# Patient Record
Sex: Female | Born: 1945 | Race: Black or African American | Hispanic: No | Smoking: Never smoker
Health system: Southern US, Community
[De-identification: ages and names within clinical notes are randomized; demographics above are authoritative.]

## PROBLEM LIST (undated history)

## (undated) DIAGNOSIS — M1990 Unspecified osteoarthritis, unspecified site: Secondary | ICD-10-CM

## (undated) DIAGNOSIS — F419 Anxiety disorder, unspecified: Secondary | ICD-10-CM

## (undated) DIAGNOSIS — K219 Gastro-esophageal reflux disease without esophagitis: Secondary | ICD-10-CM

## (undated) DIAGNOSIS — J45909 Unspecified asthma, uncomplicated: Secondary | ICD-10-CM

## (undated) DIAGNOSIS — F329 Major depressive disorder, single episode, unspecified: Secondary | ICD-10-CM

## (undated) DIAGNOSIS — W19XXXS Unspecified fall, sequela: Secondary | ICD-10-CM

## (undated) DIAGNOSIS — G4733 Obstructive sleep apnea (adult) (pediatric): Secondary | ICD-10-CM

## (undated) DIAGNOSIS — F32A Depression, unspecified: Secondary | ICD-10-CM

## (undated) DIAGNOSIS — E782 Mixed hyperlipidemia: Secondary | ICD-10-CM

## (undated) DIAGNOSIS — R490 Dysphonia: Secondary | ICD-10-CM

## (undated) DIAGNOSIS — Z973 Presence of spectacles and contact lenses: Secondary | ICD-10-CM

## (undated) HISTORY — DX: Gastro-esophageal reflux disease without esophagitis: K21.9

## (undated) HISTORY — PX: BREAST BIOPSY: SHX20

## (undated) HISTORY — DX: Obstructive sleep apnea (adult) (pediatric): G47.33

## (undated) HISTORY — DX: Unspecified osteoarthritis, unspecified site: M19.90

## (undated) HISTORY — DX: Mixed hyperlipidemia: E78.2

---
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
## 1983-04-30 HISTORY — PX: CHOLECYSTECTOMY: SHX55

## 1993-04-29 DIAGNOSIS — K219 Gastro-esophageal reflux disease without esophagitis: Secondary | ICD-10-CM

## 1993-04-29 HISTORY — DX: Gastro-esophageal reflux disease without esophagitis: K21.9

## 2000-02-18 ENCOUNTER — Ambulatory Visit (HOSPITAL_BASED_OUTPATIENT_CLINIC_OR_DEPARTMENT_OTHER): Admission: RE | Admit: 2000-02-18 | Discharge: 2000-02-18 | Payer: Self-pay | Admitting: Orthopedic Surgery

## 2001-06-26 ENCOUNTER — Ambulatory Visit (HOSPITAL_COMMUNITY): Admission: RE | Admit: 2001-06-26 | Discharge: 2001-06-26 | Payer: Self-pay | Admitting: Family Medicine

## 2001-06-26 ENCOUNTER — Encounter: Payer: Self-pay | Admitting: Family Medicine

## 2001-08-12 ENCOUNTER — Ambulatory Visit: Admission: RE | Admit: 2001-08-12 | Discharge: 2001-08-12 | Payer: Self-pay | Admitting: Family Medicine

## 2001-09-11 ENCOUNTER — Emergency Department (HOSPITAL_COMMUNITY): Admission: EM | Admit: 2001-09-11 | Discharge: 2001-09-11 | Payer: Self-pay | Admitting: *Deleted

## 2001-09-11 ENCOUNTER — Encounter: Payer: Self-pay | Admitting: *Deleted

## 2001-09-13 ENCOUNTER — Emergency Department (HOSPITAL_COMMUNITY): Admission: EM | Admit: 2001-09-13 | Discharge: 2001-09-13 | Payer: Self-pay | Admitting: *Deleted

## 2001-09-13 ENCOUNTER — Encounter: Payer: Self-pay | Admitting: *Deleted

## 2001-12-23 ENCOUNTER — Encounter: Admission: RE | Admit: 2001-12-23 | Discharge: 2002-03-23 | Payer: Self-pay | Admitting: Family Medicine

## 2002-02-22 ENCOUNTER — Encounter: Payer: Self-pay | Admitting: Emergency Medicine

## 2002-02-22 ENCOUNTER — Emergency Department (HOSPITAL_COMMUNITY): Admission: EM | Admit: 2002-02-22 | Discharge: 2002-02-22 | Payer: Self-pay | Admitting: Emergency Medicine

## 2002-03-01 ENCOUNTER — Encounter: Payer: Self-pay | Admitting: Cardiology

## 2002-03-01 ENCOUNTER — Encounter (HOSPITAL_COMMUNITY): Admission: RE | Admit: 2002-03-01 | Discharge: 2002-03-31 | Payer: Self-pay | Admitting: Cardiology

## 2003-04-30 HISTORY — PX: ROTATOR CUFF REPAIR: SHX139

## 2003-08-12 ENCOUNTER — Ambulatory Visit (HOSPITAL_COMMUNITY): Admission: RE | Admit: 2003-08-12 | Discharge: 2003-08-12 | Payer: Self-pay | Admitting: Family Medicine

## 2003-08-26 ENCOUNTER — Observation Stay (HOSPITAL_COMMUNITY): Admission: RE | Admit: 2003-08-26 | Discharge: 2003-08-27 | Payer: Self-pay | Admitting: General Surgery

## 2004-04-16 ENCOUNTER — Ambulatory Visit: Payer: Self-pay | Admitting: Family Medicine

## 2004-05-23 ENCOUNTER — Ambulatory Visit: Payer: Self-pay | Admitting: Family Medicine

## 2004-06-11 ENCOUNTER — Ambulatory Visit: Payer: Self-pay | Admitting: Orthopedic Surgery

## 2004-06-28 ENCOUNTER — Emergency Department (HOSPITAL_COMMUNITY): Admission: EM | Admit: 2004-06-28 | Discharge: 2004-06-28 | Payer: Self-pay | Admitting: Emergency Medicine

## 2004-07-02 ENCOUNTER — Ambulatory Visit: Payer: Self-pay | Admitting: Orthopedic Surgery

## 2004-08-14 ENCOUNTER — Ambulatory Visit: Payer: Self-pay | Admitting: Family Medicine

## 2004-08-22 ENCOUNTER — Ambulatory Visit (HOSPITAL_COMMUNITY): Admission: RE | Admit: 2004-08-22 | Discharge: 2004-08-22 | Payer: Self-pay | Admitting: Family Medicine

## 2004-12-27 ENCOUNTER — Ambulatory Visit: Payer: Self-pay | Admitting: Family Medicine

## 2005-01-08 ENCOUNTER — Ambulatory Visit: Payer: Self-pay | Admitting: *Deleted

## 2005-01-24 ENCOUNTER — Ambulatory Visit: Payer: Self-pay | Admitting: Cardiology

## 2005-01-24 ENCOUNTER — Ambulatory Visit: Payer: Self-pay | Admitting: *Deleted

## 2005-01-24 ENCOUNTER — Encounter (HOSPITAL_COMMUNITY): Admission: RE | Admit: 2005-01-24 | Discharge: 2005-01-26 | Payer: Self-pay | Admitting: *Deleted

## 2005-01-31 ENCOUNTER — Ambulatory Visit: Payer: Self-pay | Admitting: *Deleted

## 2005-02-26 ENCOUNTER — Ambulatory Visit: Payer: Self-pay | Admitting: Family Medicine

## 2005-06-26 ENCOUNTER — Ambulatory Visit: Payer: Self-pay | Admitting: Family Medicine

## 2005-08-26 ENCOUNTER — Ambulatory Visit: Payer: Self-pay | Admitting: Family Medicine

## 2005-09-02 ENCOUNTER — Encounter (HOSPITAL_COMMUNITY): Admission: RE | Admit: 2005-09-02 | Discharge: 2005-10-02 | Payer: Self-pay | Admitting: Family Medicine

## 2005-12-20 ENCOUNTER — Ambulatory Visit: Payer: Self-pay | Admitting: Family Medicine

## 2006-01-03 ENCOUNTER — Ambulatory Visit: Payer: Self-pay | Admitting: Family Medicine

## 2006-02-26 ENCOUNTER — Ambulatory Visit: Payer: Self-pay | Admitting: Family Medicine

## 2006-02-26 ENCOUNTER — Encounter (INDEPENDENT_AMBULATORY_CARE_PROVIDER_SITE_OTHER): Payer: Self-pay | Admitting: *Deleted

## 2006-02-26 ENCOUNTER — Other Ambulatory Visit: Admission: RE | Admit: 2006-02-26 | Discharge: 2006-02-26 | Payer: Self-pay | Admitting: Family Medicine

## 2006-02-26 LAB — CONVERTED CEMR LAB: Pap Smear: NORMAL

## 2006-06-05 ENCOUNTER — Ambulatory Visit (HOSPITAL_COMMUNITY): Admission: RE | Admit: 2006-06-05 | Discharge: 2006-06-05 | Payer: Self-pay | Admitting: Family Medicine

## 2006-07-11 ENCOUNTER — Ambulatory Visit: Payer: Self-pay | Admitting: Family Medicine

## 2006-08-22 ENCOUNTER — Ambulatory Visit: Payer: Self-pay | Admitting: Family Medicine

## 2006-08-22 ENCOUNTER — Ambulatory Visit (HOSPITAL_COMMUNITY): Admission: RE | Admit: 2006-08-22 | Discharge: 2006-08-22 | Payer: Self-pay | Admitting: Family Medicine

## 2006-08-22 LAB — CONVERTED CEMR LAB
Calcium: 9.2 mg/dL (ref 8.4–10.5)
Hgb A1c MFr Bld: 5.5 % (ref 4.6–6.1)
Potassium: 3.9 meq/L (ref 3.5–5.3)
Sodium: 141 meq/L (ref 135–145)

## 2006-12-22 ENCOUNTER — Ambulatory Visit: Payer: Self-pay | Admitting: Family Medicine

## 2006-12-22 ENCOUNTER — Encounter (INDEPENDENT_AMBULATORY_CARE_PROVIDER_SITE_OTHER): Payer: Self-pay | Admitting: *Deleted

## 2006-12-22 LAB — CONVERTED CEMR LAB
ALT: 23 units/L (ref 0–35)
Blood Glucose, Fasting: 88 mg/dL
CO2: 21 meq/L (ref 19–32)
Calcium: 9.6 mg/dL (ref 8.4–10.5)
Chloride: 105 meq/L (ref 96–112)
Cholesterol: 220 mg/dL — ABNORMAL HIGH (ref 0–200)
Eosinophils Relative: 7 % — ABNORMAL HIGH (ref 0–5)
Glucose, Bld: 88 mg/dL (ref 70–99)
HCT: 41.5 % (ref 36.0–46.0)
Hemoglobin: 13.9 g/dL (ref 12.0–15.0)
Lymphocytes Relative: 27 % (ref 12–46)
Lymphs Abs: 1.2 10*3/uL (ref 0.7–3.3)
Monocytes Absolute: 0.4 10*3/uL (ref 0.2–0.7)
Neutro Abs: 2.6 10*3/uL (ref 1.7–7.7)
Platelets: 276 10*3/uL (ref 150–400)
RBC count: 5.33 10*6/uL
Sodium: 142 meq/L (ref 135–145)
TSH: 2.947 microintl units/mL
Total Bilirubin: 1.2 mg/dL (ref 0.3–1.2)
Total Protein: 7.9 g/dL (ref 6.0–8.3)
Triglycerides: 86 mg/dL (ref <150)
VLDL: 17 mg/dL (ref 0–40)
WBC, blood: 4.6 10*3/uL
WBC: 4.6 10*3/uL (ref 4.0–10.5)

## 2007-02-22 ENCOUNTER — Emergency Department (HOSPITAL_COMMUNITY): Admission: EM | Admit: 2007-02-22 | Discharge: 2007-02-22 | Payer: Self-pay | Admitting: Emergency Medicine

## 2007-03-24 ENCOUNTER — Ambulatory Visit: Payer: Self-pay | Admitting: Family Medicine

## 2007-04-17 ENCOUNTER — Ambulatory Visit (HOSPITAL_COMMUNITY): Admission: RE | Admit: 2007-04-17 | Discharge: 2007-04-17 | Payer: Self-pay | Admitting: *Deleted

## 2007-04-30 ENCOUNTER — Encounter: Payer: Self-pay | Admitting: Family Medicine

## 2007-05-25 ENCOUNTER — Encounter: Payer: Self-pay | Admitting: *Deleted

## 2007-05-25 DIAGNOSIS — M159 Polyosteoarthritis, unspecified: Secondary | ICD-10-CM

## 2007-05-25 DIAGNOSIS — E669 Obesity, unspecified: Secondary | ICD-10-CM

## 2007-05-25 DIAGNOSIS — E785 Hyperlipidemia, unspecified: Secondary | ICD-10-CM

## 2007-08-04 ENCOUNTER — Ambulatory Visit: Payer: Self-pay | Admitting: Family Medicine

## 2007-08-05 DIAGNOSIS — M549 Dorsalgia, unspecified: Secondary | ICD-10-CM | POA: Insufficient documentation

## 2007-08-06 ENCOUNTER — Encounter: Payer: Self-pay | Admitting: Family Medicine

## 2007-08-06 ENCOUNTER — Ambulatory Visit (HOSPITAL_COMMUNITY): Admission: RE | Admit: 2007-08-06 | Discharge: 2007-08-06 | Payer: Self-pay | Admitting: Family Medicine

## 2007-08-06 LAB — CONVERTED CEMR LAB
CO2: 24 meq/L (ref 19–32)
Calcium: 9.5 mg/dL (ref 8.4–10.5)
Cholesterol: 217 mg/dL — ABNORMAL HIGH (ref 0–200)
HDL: 62 mg/dL (ref >39)
Sodium: 143 meq/L (ref 135–145)
Total CHOL/HDL Ratio: 3.5
Triglycerides: 60 mg/dL (ref <150)

## 2007-08-13 ENCOUNTER — Ambulatory Visit: Payer: Self-pay | Admitting: Family Medicine

## 2007-08-13 LAB — CONVERTED CEMR LAB
Basophils Relative: 1 % (ref 0–1)
Eosinophils Absolute: 0.4 10*3/uL (ref 0.0–0.7)
MCHC: 34.2 g/dL (ref 30.0–36.0)
MCV: 75 fL — ABNORMAL LOW (ref 78.0–100.0)
Neutrophils Relative %: 49 % (ref 43–77)
Platelets: 282 10*3/uL (ref 150–400)

## 2007-08-14 ENCOUNTER — Ambulatory Visit: Payer: Self-pay | Admitting: Family Medicine

## 2007-08-17 ENCOUNTER — Ambulatory Visit: Payer: Self-pay | Admitting: Family Medicine

## 2007-09-17 ENCOUNTER — Encounter: Payer: Self-pay | Admitting: Family Medicine

## 2007-09-17 ENCOUNTER — Ambulatory Visit: Payer: Self-pay | Admitting: Family Medicine

## 2007-09-17 ENCOUNTER — Other Ambulatory Visit: Admission: RE | Admit: 2007-09-17 | Discharge: 2007-09-17 | Payer: Self-pay | Admitting: Family Medicine

## 2007-09-17 LAB — CONVERTED CEMR LAB
ALT: 68 units/L — ABNORMAL HIGH (ref 0–35)
Albumin: 4.2 g/dL (ref 3.5–5.2)
Bilirubin, Direct: 0.1 mg/dL (ref 0.0–0.3)
Total Bilirubin: 0.9 mg/dL (ref 0.3–1.2)

## 2008-01-01 ENCOUNTER — Ambulatory Visit: Payer: Self-pay | Admitting: Family Medicine

## 2008-01-01 LAB — CONVERTED CEMR LAB
CO2: 25 meq/L (ref 19–32)
Cholesterol: 204 mg/dL — ABNORMAL HIGH (ref 0–200)
Glucose, Bld: 87 mg/dL (ref 70–99)
HDL: 59 mg/dL (ref >39)
Potassium: 4.3 meq/L (ref 3.5–5.3)
Sodium: 141 meq/L (ref 135–145)
Total CHOL/HDL Ratio: 3.5
VLDL: 22 mg/dL (ref 0–40)

## 2008-01-07 ENCOUNTER — Telehealth: Payer: Self-pay | Admitting: Family Medicine

## 2008-02-03 ENCOUNTER — Telehealth: Payer: Self-pay | Admitting: Family Medicine

## 2008-02-03 ENCOUNTER — Ambulatory Visit (HOSPITAL_COMMUNITY): Admission: RE | Admit: 2008-02-03 | Discharge: 2008-02-03 | Payer: Self-pay | Admitting: Family Medicine

## 2008-02-03 ENCOUNTER — Encounter: Payer: Self-pay | Admitting: Family Medicine

## 2008-02-03 ENCOUNTER — Ambulatory Visit: Payer: Self-pay | Admitting: Family Medicine

## 2008-02-03 DIAGNOSIS — R112 Nausea with vomiting, unspecified: Secondary | ICD-10-CM

## 2008-02-03 DIAGNOSIS — J209 Acute bronchitis, unspecified: Secondary | ICD-10-CM

## 2008-02-03 DIAGNOSIS — R110 Nausea: Secondary | ICD-10-CM

## 2008-04-29 DIAGNOSIS — J45909 Unspecified asthma, uncomplicated: Secondary | ICD-10-CM

## 2008-04-29 HISTORY — DX: Unspecified asthma, uncomplicated: J45.909

## 2008-08-03 ENCOUNTER — Ambulatory Visit (HOSPITAL_COMMUNITY): Admission: RE | Admit: 2008-08-03 | Discharge: 2008-08-03 | Payer: Self-pay | Admitting: Family Medicine

## 2009-06-28 ENCOUNTER — Emergency Department (HOSPITAL_COMMUNITY): Admission: EM | Admit: 2009-06-28 | Discharge: 2009-06-28 | Payer: Self-pay | Admitting: Emergency Medicine

## 2010-02-17 ENCOUNTER — Emergency Department (HOSPITAL_COMMUNITY): Admission: EM | Admit: 2010-02-17 | Discharge: 2010-02-18 | Payer: Self-pay | Admitting: Emergency Medicine

## 2010-05-20 ENCOUNTER — Encounter: Payer: Self-pay | Admitting: Family Medicine

## 2010-05-29 NOTE — Letter (Signed)
Summary: DEMO  DEMO   Imported By: Lind Guest 11/08/2009 16:20:55  _____________________________________________________________________  External Attachment:    Type:   Image     Comment:   External Document

## 2010-05-29 NOTE — Letter (Signed)
Summary: LABS  LABS   Imported By: Lind Guest 11/08/2009 16:22:01  _____________________________________________________________________  External Attachment:    Type:   Image     Comment:   External Document

## 2010-05-29 NOTE — Letter (Signed)
Summary: OFFICE NOTES  OFFICE NOTES   Imported By: Lind Guest 11/08/2009 16:23:14  _____________________________________________________________________  External Attachment:    Type:   Image     Comment:   External Document

## 2010-05-29 NOTE — Letter (Signed)
Summary: MISC  MISC   Imported By: Lind Guest 11/08/2009 16:22:32  _____________________________________________________________________  External Attachment:    Type:   Image     Comment:   External Document

## 2010-05-29 NOTE — Letter (Signed)
Summary: PHONE NOTES  PHONE NOTES   Imported By: Lind Guest 11/08/2009 16:25:22  _____________________________________________________________________  External Attachment:    Type:   Image     Comment:   External Document

## 2010-05-29 NOTE — Letter (Signed)
Summary: HISTORY AND PHYSICAL  HISTORY AND PHYSICAL   Imported By: Lind Guest 11/08/2009 16:21:34  _____________________________________________________________________  External Attachment:    Type:   Image     Comment:   External Document

## 2010-05-29 NOTE — Letter (Signed)
Summary: CONSULTS  CONSULTS   Imported By: Lind Guest 11/08/2009 16:20:30  _____________________________________________________________________  External Attachment:    Type:   Image     Comment:   External Document

## 2010-05-29 NOTE — Letter (Signed)
Summary: X RAYS  X RAYS   Imported By: Lind Guest 11/08/2009 16:25:53  _____________________________________________________________________  External Attachment:    Type:   Image     Comment:   External Document

## 2010-08-10 ENCOUNTER — Emergency Department (HOSPITAL_COMMUNITY)
Admission: EM | Admit: 2010-08-10 | Discharge: 2010-08-11 | Disposition: A | Discharge status: Home / Self Care | Payer: BC Managed Care – PPO | Attending: Emergency Medicine | Admitting: Emergency Medicine

## 2010-08-10 ENCOUNTER — Inpatient Hospital Stay (INDEPENDENT_AMBULATORY_CARE_PROVIDER_SITE_OTHER)
Admission: RE | Admit: 2010-08-10 | Discharge: 2010-08-10 | Disposition: A | Discharge status: Home / Self Care | Payer: BLUE CROSS/BLUE SHIELD | Source: Ambulatory Visit | Attending: Emergency Medicine | Admitting: Emergency Medicine

## 2010-08-10 DIAGNOSIS — S1093XA Contusion of unspecified part of neck, initial encounter: Secondary | ICD-10-CM | POA: Insufficient documentation

## 2010-08-10 DIAGNOSIS — S0003XA Contusion of scalp, initial encounter: Secondary | ICD-10-CM | POA: Insufficient documentation

## 2010-08-10 DIAGNOSIS — IMO0002 Reserved for concepts with insufficient information to code with codable children: Secondary | ICD-10-CM | POA: Insufficient documentation

## 2010-08-10 DIAGNOSIS — R110 Nausea: Secondary | ICD-10-CM | POA: Insufficient documentation

## 2010-08-10 DIAGNOSIS — S060X9A Concussion with loss of consciousness of unspecified duration, initial encounter: Secondary | ICD-10-CM | POA: Insufficient documentation

## 2010-08-10 DIAGNOSIS — M129 Arthropathy, unspecified: Secondary | ICD-10-CM | POA: Insufficient documentation

## 2010-08-10 DIAGNOSIS — W010XXA Fall on same level from slipping, tripping and stumbling without subsequent striking against object, initial encounter: Secondary | ICD-10-CM | POA: Insufficient documentation

## 2010-08-10 DIAGNOSIS — R404 Transient alteration of awareness: Secondary | ICD-10-CM | POA: Insufficient documentation

## 2010-08-10 DIAGNOSIS — J45909 Unspecified asthma, uncomplicated: Secondary | ICD-10-CM | POA: Insufficient documentation

## 2010-08-10 DIAGNOSIS — R51 Headache: Secondary | ICD-10-CM | POA: Insufficient documentation

## 2010-08-10 DIAGNOSIS — Y929 Unspecified place or not applicable: Secondary | ICD-10-CM | POA: Insufficient documentation

## 2010-08-11 ENCOUNTER — Emergency Department (HOSPITAL_COMMUNITY): Payer: BC Managed Care – PPO

## 2010-09-14 NOTE — Op Note (Signed)
NAME:  Angela Chase, Angela Chase                      ACCOUNT NO.:  [id number]   MEDICAL RECORD NO.:  [id number]                   PATIENT TYPE:  OBV   LOCATION:  A301                                 FACILITY:  [facility]   PHYSICIAN:  Dirk Dress. Katrinka Blazing, M.D.                DATE OF BIRTH:  1945/07/11   DATE OF PROCEDURE:  DATE OF DISCHARGE:                                 OPERATIVE REPORT   PREOPERATIVE DIAGNOSIS:  Cholelithiasis, cholecystitis.   POSTOPERATIVE DIAGNOSIS:  Cholelithiasis, cholecystitis.   PROCEDURE:  Laparoscopic cholecystectomy.   SURGEON:  Dirk Dress. Katrinka Blazing, M.D.   DESCRIPTION OF PROCEDURE:  Under general endotracheal anesthesia, the  patient's abdomen was prepped and draped in a sterile field.   A supraumbilical midline incision was made.  A Veress needle was inserted  uneventfully.  The abdomen was insufflated with 3 L of CO2.  Using a  Visiport guide, a 10-mm port was placed uneventfully.  The laparoscope was  placed.  Laparoscopic evaluation of the liver revealed it to be slightly  sclerotic with yellowish changes suggestive of fatty infiltration.  There  was some sclerosis in the liver bed.  Under videoscopic guidance, a 10-mm  port and two 5-mm ports were placed in the right upper quadrant.  The  gallbladder was manipulated by the assistant.  The cystic duct was  dissected, clipped with five clips, and divided.  The cystic artery was  dissected, clipped with three clips, and divided.  Using electrocautery, the  gallbladder was separated from the infrahepatic space without difficulty.  It was placed in an EndoCatch device and retrieved.  Hemostasis in the bed  was achieved.  There was no evidence of bile leak.  Four photos of the liver  and the gallbladder bed were taken.  The irrigating fluid was clear.  CO2  was allowed to escape from the abdomen, and the ports were removed.  The  incisions were closed using 0 Dexon on the fascia of the lower two incisions  and staples on  the skin.  Dressings were placed.   The patient tolerated the procedure well.  She was awakened from anesthesia  without difficulty, transferred to a bed, and taken to the postanesthetic  care unit for further monitoring.      ___________________________________________                                            Dirk Dress. Katrinka Blazing, M.D.   LCS/MEDQ  D:  08/26/2003  T:  08/26/2003  Job:  [id number]

## 2010-09-14 NOTE — Op Note (Signed)
Angela Chase. [facility]  Patient:    Angela Chase, Angela Chase                     MRN: [id number] Proc. Date: 02/18/00 Adm. Date:  [id number] Disc. Date: [id number] Attending:  Twana First                           Operative Report  PREOPERATIVE DIAGNOSIS:  Right shoulder partial rotator cuff tear.  POSTOPERATIVE DIAGNOSIS:  Right shoulder rotator cuff tendonitis and impingement.  OPERATION:  Right shoulder examination under anesthesia followed by arthroscopic subacromial decompression.  SURGEON:  Elana Alm. Thurston Hole, M.D.  ASSISTANT:  Kirstin A. Shepperson, P.A.  ANESTHESIA:  General  OPERATIVE TIME:  30 minutes  COMPLICATIONS:  None.  INDICATION FOR PROCEDURE:  Mrs. Sidell is a 65 year old woman who had injured her right shoulder over a year ago in a fall.  Significant pain.  MRI documenting rotating cuff tendonitis with possible partial tear.  Failed conservative care and is now to undergo arthroscopy.  DESCRIPTION OF PROCEDURE:  Mrs. Lal is brought to the operating room on February 18, 2000 after supraclavicular block had been placed in the holding room.  Placed on the operative table in the supine position. After an adequate level of general anesthesia was obtained, her right shoulder was examined under anesthesia.  She has full range of motion and her shoulder was stable to ligamentous exam.  After this was done, she was placed in a beach chair position and her shoulder and arm were prepped using sterile Betadine and draped using sterile technique.  Originally through a posterior arthroscopic portal, the arthroscope with a pump attachment was placed and through an anterior portal an arthroscopic probe was placed.  On initial inspection, the articular cartilage and the glenohumeral joint was found to be normal. Anterior and posterior labrum normal.  Anterior inferior glenohumeral ligament complex normal.  Superior labrum and biceps tendon  anchor was normal. Biceps tendon was normal.  Rotator cuff was thoroughly inspected but there was no found to be no evidence of a tear on the articular surface. The subacromial space was entered and a lateral arthroscopic portal was made.  Moderate bursitis was resected.  Underneath this, the rotator cuff was found to be moderately frayed and edematous but no evidence of a tear.  Subacromial decompression was carried out, removing 6 to 8 mm of the undersurface of the anterior, anterolateral and anterior medial acromion and a CA ligament release carried out with cautery. The [facility] joint was not disturbed.  After this was done, the shoulder could be brought through a full range of motion with no impingement on the rotator cuff.  At this point, it was felt that all pathology had been satisfactorily addressed.  Instruments were removed. Portals were closed with 3-0 nylon suture and injected with 0.25% Marcaine with epinephrine.  Sterile dressings and a sling applied.  The patient awakened and taken to the recovery room in stable condition.  FOLLOW UP CARE: Mrs. Kreiser will be followed as an outpatient on Vicodin and Naprosyn.  Begin early physical therapy for range of motion, followed by strengthening.  See her back in the office in a week for sutures out and follow up. DD:  02/18/00 TD:  02/18/00 Job: [id number]

## 2010-09-14 NOTE — H&P (Signed)
NAME:  Angela Chase, Angela Chase                      ACCOUNT NO.:  [id number]   MEDICAL RECORD NO.:  [id number]                   PATIENT TYPE:  AMB   LOCATION:  DAY                                  FACILITY:  [facility]   PHYSICIAN:  Jerolyn Shin C. Katrinka Blazing, M.D.                DATE OF BIRTH:  March 14, 1946   DATE OF ADMISSION:  DATE OF DISCHARGE:                                HISTORY & PHYSICAL   HISTORY OF PRESENT ILLNESS:  [age]-year-old female with history of  intermittent epigastric and right upper quadrant discomfort.  She has had  nausea.  Evaluation revealed a contracted gallbladder with multiple small  stones.  The patient has been scheduled for laparoscopic cholecystectomy.   PAST HISTORY:  The past history is positive for:  1. Osteoarthritis.  2. Hyperactive bladder syndrome.  3. Menopausal syndrome.   MEDICATIONS:  1. Celebrex 200 mg daily.  2. Lasix 40 mg daily.  3. Potassium chloride 20 mEq daily,  4. Detrol LA 4 mg daily.  5. Premarin 0.45 mg daily.   PHYSICAL EXAMINATION:  VITAL SIGNS:  On exam blood pressure is 120/80, pulse  76, respirations 20 and weight 254 pounds.  HEENT:  Unremarkable.  NECK:  Neck is supple.  CHEST:  Chest is clear.  HEART:  Regular rate and rhythm without murmur, gallop or rub.  ABDOMEN:  Moderate epigastric and right upper quadrant tenderness.  No mass.  Mild guarding.  Normoactive bowel sounds.  EXTREMITIES:  Two plus edema bilateral otherwise unremarkable.  NEUROLOGIC:  Neurologic exam is nonfocal.   IMPRESSION:  1. Cholelithiasis.  2. Cholecystitis.  3. Osteoarthritis.  4. Menopausal syndrome.  5. Hyperactive bladder syndrome.   PLAN:  Laparoscopic cholecystectomy.     ___________________________________________                                         Dirk Dress Katrinka Blazing, M.D.   LCS/MEDQ  D:  08/26/2003  T:  08/26/2003  Job:  [id number]

## 2010-09-14 NOTE — Procedures (Signed)
NAMEJARIYAH, Angela Chase NO.:  [id number]   MEDICAL RECORD NO.:  [id number]          PATIENT TYPE:  REC   LOCATION:                                FACILITY:  [facility]   PHYSICIAN:  [place] Bing, M.D. [facility] OF BIRTH:  [birth year]   DATE OF PROCEDURE:  01/24/2005  DATE OF DISCHARGE:                                  ECHOCARDIOGRAM   REFERRING:  Dr. Lodema Hong and Dr. Dorethea Clan.   CLINICAL DATA:  A 65 year old woman with chest pain.   M-MODE:  Aorta 2.4, left atrium 3.3, septum 1.2, posterior wall 1.2, LV  diastole 3.4, LV systole 2.4.   1.  Technically adequate echocardiographic study.  2.  Normal left atrium, right atrium and right ventricle.  3.  Normal aortic, mitral, tricuspid and pulmonic valves; normal proximal      pulmonary artery.  4.  Normal Doppler examination with trivial mitral regurgitation and      physiologic tricuspid regurgitation.  5.  Normal left ventricular size; borderline hypertrophy; normal regional      and global function.  6.  Normal IVC.      [place] Bing, M.D. [facility]  Electronically Signed     RR/MEDQ  D:  01/24/2005  T:  01/24/2005  Job:  [id number]

## 2010-11-25 ENCOUNTER — Emergency Department (HOSPITAL_COMMUNITY): Payer: BC Managed Care – PPO

## 2010-11-25 ENCOUNTER — Emergency Department (HOSPITAL_COMMUNITY)
Admission: EM | Admit: 2010-11-25 | Discharge: 2010-11-25 | Disposition: A | Discharge status: Home / Self Care | Payer: BC Managed Care – PPO | Attending: Emergency Medicine | Admitting: Emergency Medicine

## 2010-11-25 DIAGNOSIS — R112 Nausea with vomiting, unspecified: Secondary | ICD-10-CM | POA: Insufficient documentation

## 2010-11-25 DIAGNOSIS — K5289 Other specified noninfective gastroenteritis and colitis: Secondary | ICD-10-CM | POA: Insufficient documentation

## 2010-11-25 DIAGNOSIS — K209 Esophagitis, unspecified without bleeding: Secondary | ICD-10-CM | POA: Insufficient documentation

## 2010-11-25 DIAGNOSIS — R6883 Chills (without fever): Secondary | ICD-10-CM | POA: Insufficient documentation

## 2010-11-25 DIAGNOSIS — R1013 Epigastric pain: Secondary | ICD-10-CM | POA: Insufficient documentation

## 2010-11-25 DIAGNOSIS — J45909 Unspecified asthma, uncomplicated: Secondary | ICD-10-CM | POA: Insufficient documentation

## 2010-11-25 DIAGNOSIS — E669 Obesity, unspecified: Secondary | ICD-10-CM | POA: Insufficient documentation

## 2010-11-25 LAB — COMPREHENSIVE METABOLIC PANEL
ALT: 24 U/L (ref 0–35)
Albumin: 3.5 g/dL (ref 3.5–5.2)
Calcium: 9.5 mg/dL (ref 8.4–10.5)
GFR calc Af Amer: >60 mL/min (ref >60)
Glucose, Bld: 94 mg/dL (ref 70–99)
Potassium: 3.7 mEq/L (ref 3.5–5.1)
Sodium: 140 mEq/L (ref 135–145)
Total Protein: 7.9 g/dL (ref 6.0–8.3)

## 2010-11-25 LAB — CK TOTAL AND CKMB (NOT AT ARMC)
CK, MB: 1.6 ng/mL (ref 0.3–4.0)
Relative Index: INVALID (ref 0.0–2.5)
Relative Index: INVALID (ref 0.0–2.5)
Total CK: 63 U/L (ref 7–177)
Total CK: 64 U/L (ref 7–177)

## 2010-11-25 LAB — CBC
HCT: 40.8 % (ref 36.0–46.0)
Hemoglobin: 14.3 g/dL (ref 12.0–15.0)
MCHC: 35 g/dL (ref 30.0–36.0)
RBC: 5.52 MIL/uL — ABNORMAL HIGH (ref 3.87–5.11)
WBC: 5.7 10*3/uL (ref 4.0–10.5)

## 2010-11-25 LAB — DIFFERENTIAL
Basophils Absolute: 0 10*3/uL (ref 0.0–0.1)
Eosinophils Relative: 2 % (ref 0–5)
Lymphocytes Relative: 14 % (ref 12–46)
Lymphs Abs: 0.8 10*3/uL (ref 0.7–4.0)
Monocytes Relative: 9 % (ref 3–12)
Neutrophils Relative %: 75 % (ref 43–77)

## 2010-11-25 LAB — TROPONIN I: Troponin I: <0.3 ng/mL (ref <0.30)

## 2010-11-25 MED ORDER — IOHEXOL 300 MG/ML  SOLN: 100.0000 mL | Freq: Once | INTRAMUSCULAR | Status: DC | PRN

## 2011-04-28 ENCOUNTER — Emergency Department (INDEPENDENT_AMBULATORY_CARE_PROVIDER_SITE_OTHER): Payer: BC Managed Care – PPO

## 2011-04-28 ENCOUNTER — Emergency Department (INDEPENDENT_AMBULATORY_CARE_PROVIDER_SITE_OTHER)
Admission: EM | Admit: 2011-04-28 | Discharge: 2011-04-28 | Disposition: A | Discharge status: Home / Self Care | Payer: BC Managed Care – PPO | Source: Home / Self Care

## 2011-04-28 DIAGNOSIS — J988 Other specified respiratory disorders: Secondary | ICD-10-CM

## 2011-04-28 DIAGNOSIS — J209 Acute bronchitis, unspecified: Secondary | ICD-10-CM

## 2011-04-28 DIAGNOSIS — J45909 Unspecified asthma, uncomplicated: Secondary | ICD-10-CM

## 2011-04-28 DIAGNOSIS — B9789 Other viral agents as the cause of diseases classified elsewhere: Secondary | ICD-10-CM

## 2011-04-28 MED ORDER — ALBUTEROL SULFATE (5 MG/ML) 0.5% IN NEBU
INHALATION_SOLUTION | RESPIRATORY_TRACT | Status: AC
  Filled 2011-04-28: qty 1

## 2011-04-28 MED ORDER — ALBUTEROL SULFATE (5 MG/ML) 0.5% IN NEBU
5.0000 mg | INHALATION_SOLUTION | Freq: Once | RESPIRATORY_TRACT | Status: AC
  Administered 2011-04-28: 5 mg via RESPIRATORY_TRACT

## 2011-04-28 MED ORDER — ALBUTEROL SULFATE HFA 108 (90 BASE) MCG/ACT IN AERS: 2.0000 | INHALATION_SPRAY | RESPIRATORY_TRACT | Status: DC | PRN

## 2011-04-28 MED ORDER — IPRATROPIUM BROMIDE 0.02 % IN SOLN
0.5000 mg | Freq: Once | RESPIRATORY_TRACT | Status: AC
  Administered 2011-04-28: 0.5 mg via RESPIRATORY_TRACT

## 2011-04-28 MED ORDER — PREDNISONE 20 MG PO TABS: 20.0000 mg | ORAL_TABLET | Freq: Two times a day (BID) | ORAL | Status: AC

## 2011-04-28 NOTE — ED Provider Notes (Signed)
History     CSN: [id number]  Arrival date & time 04/28/11  0904   None     Chief Complaint  Patient presents with  . Nasal Congestion    cough,congestion, fever, headache,     (Consider location/radiation/quality/duration/timing/severity/associated sxs/prior treatment) HPI Comments: Onset 6 days ago of chills, cough, HA, body aches and sore throat. Pt states she has felt feverish but has not checked her temp. She has been taking Mucinex and over the counter fever reducing medication. She feels that her symptoms have worsened in the last 3-4 days. Her cough is productive with yellow mucus. Mild nasal congestion, without sinus pressure. She states she has shortness of breath only while coughing, but has had intermittent wheezing.   The history is provided by the patient.    Past Medical History  Diagnosis Date  . Asthma   . Osteoporosis     Past Surgical History  Procedure Date  . Rotator cuff repair     No family history on file.  History  Substance Use Topics  . Smoking status: Never Smoker   . Smokeless tobacco: Not on file  . Alcohol Use: No    OB History    Grav Para Term Preterm Abortions TAB SAB Ect Mult Living                  Review of Systems  Constitutional: Positive for fever, chills and fatigue.  HENT: Positive for congestion, sore throat and rhinorrhea. Negative for ear pain, sneezing, postnasal drip and sinus pressure.   Respiratory: Positive for cough, shortness of breath and wheezing.   Cardiovascular: Negative for chest pain and palpitations.    Allergies  Cephalexin and Rofecoxib  Home Medications   Current Outpatient Rx  Name Route Sig Dispense Refill  . FUROSEMIDE 20 MG PO TABS Oral Take 20 mg by mouth daily as needed.      Marland Kitchen HYDROCODONE-ACETAMINOPHEN 5-325 MG PO TABS Oral Take 1 tablet by mouth every 6 (six) hours as needed.      . ALBUTEROL SULFATE HFA 108 (90 BASE) MCG/ACT IN AERS Inhalation Inhale 2 puffs into the lungs every 4  (four) hours as needed for wheezing or shortness of breath. 1 Inhaler 0  . PREDNISONE 20 MG PO TABS Oral Take 1 tablet (20 mg total) by mouth 2 (two) times daily. 10 tablet 0    BP 140/89  Pulse 106  Temp(Src) 99.5 F (37.5 C) (Oral)  Resp 25  SpO2 99%  Physical Exam  Nursing note and vitals reviewed. Constitutional: She appears well-developed and well-nourished. No distress.  HENT:  Head: Normocephalic and atraumatic.  Right Ear: Tympanic membrane, external ear and ear canal normal.  Left Ear: Tympanic membrane, external ear and ear canal normal.  Nose: Nose normal.  Mouth/Throat: Uvula is midline, oropharynx is clear and moist and mucous membranes are normal. No oropharyngeal exudate, posterior oropharyngeal edema or posterior oropharyngeal erythema.  Neck: Neck supple.  Cardiovascular: Normal rate, regular rhythm and normal heart sounds.   Pulmonary/Chest: Effort normal. No respiratory distress. She has decreased breath sounds. She has no wheezes. She has no rhonchi. She has no rales.  Lymphadenopathy:    She has no cervical adenopathy.  Neurological: She is alert.  Skin: Skin is warm and dry.  Psychiatric: She has a normal mood and affect.    ED Course  Procedures (including critical care time)  Labs Reviewed - No data to display Dg Chest 2 View  04/28/2011  *RADIOLOGY REPORT*  Clinical Data: Cough, fever  CHEST - 2 VIEW  Comparison: 11/25/2010  Findings: Cardiomediastinal silhouette is stable.  No acute infiltrate or pleural effusion.  No pulmonary edema.  Stable mild degenerative changes thoracic spine.  IMPRESSION: No active disease.  Original Report Authenticated By: Natasha Mead, M.D.     1. Viral respiratory illness   2. Asthma       MDM  Symptomatic improvement and improved breath sounds after NMT. Pulse ox improved to 99% RA. Flu-like-symptoms, without documented fever. Hx of asthma.         Melody Comas, [place] 04/28/11 1031

## 2011-04-28 NOTE — ED Provider Notes (Signed)
Medical screening examination/treatment/procedure(s) were performed by non-physician practitioner and as supervising physician I was immediately available for consultation/collaboration.  Heidy Mccubbin   Eean Buss, MD 04/28/11 1302 

## 2011-04-28 NOTE — ED Notes (Signed)
Pt c/o of fever, productive cough with yellow sputum, dizziness, chills, headache and congestion started one week ago, has tried muscinex without relief.

## 2011-08-30 ENCOUNTER — Encounter (HOSPITAL_COMMUNITY): Payer: Self-pay

## 2011-08-30 ENCOUNTER — Emergency Department (HOSPITAL_COMMUNITY)
Admission: EM | Admit: 2011-08-30 | Discharge: 2011-08-30 | Disposition: A | Discharge status: Home / Self Care | Payer: BC Managed Care – PPO | Source: Home / Self Care | Attending: Family Medicine | Admitting: Family Medicine

## 2011-08-30 DIAGNOSIS — R0789 Other chest pain: Secondary | ICD-10-CM

## 2011-08-30 NOTE — ED Notes (Signed)
Dr Juanetta Gosling saw EKG- aware of pts sx and hx.  Has already spoke with pt

## 2011-08-30 NOTE — ED Notes (Signed)
CALLED TO WAITING AREA TO ASSESS A PT WITH SUDDEN RIGHT SHOULDER ACHY PAIN THAT EASED UP THROUGH THE NIGHT BUT THEN DEVELOPED MID STERNAL INTERMITT SHARP CP WITH DRIVING TODAY.PAIN NON RADIATING.RATES PAIN 3/10 AT THIS TIME.PT STATES SHE HAD SIMILAR SX 3 MNTHS AGO BUT DIDN'T SEEK MEDICAL ATTENTION.STATES LAST EKG 2 MNTHS AGO BUT NORMAL.NO DISTRESS SEEN.VSS.TOLD PT INFORM FRONT DESK IF SX WORSENS WITH SOB OR INCREASED PAIN

## 2011-08-30 NOTE — ED Provider Notes (Signed)
History     CSN: [id number]  Arrival date & time 08/30/11  1206   First MD Initiated Contact with Patient 08/30/11 1211      Chief Complaint  Patient presents with  . Chest Pain    (Consider location/radiation/quality/duration/timing/severity/associated sxs/prior treatment) HPI Comments: Angela Chase presents for evaluation of mid-sternal sharp chest pain that started yesterday at rest. She reports that the pain is sharp, not brought on by exertion, not relieved by rest, and lasts for hours before resolving on its own. She denies any associated symptoms such as cough, shortness of breath, nausea, diaphoresis, or visual changes. She denies any known cardiac hx, is not being treated for hypertension, and has no known coronary artery disease. She does report a recent hx of increased stress surrounding family issues and illness in a family member. She denies any hx of anxiety or panic attacks. She reports a normal EKG several months ago and she reports that she had a negative stress test "sometime late in 2012, maybe July."  Patient is a 66 y.o. female presenting with chest pain. The history is provided by the patient.  Chest Pain The chest pain began yesterday. Chest pain occurs intermittently. The chest pain is improving. The pain is associated with breathing. The quality of the pain is described as aching and sharp. The pain does not radiate. Pertinent negatives for primary symptoms include no shortness of breath, no palpitations, no nausea and no vomiting.  Pertinent negatives for associated symptoms include no diaphoresis, no near-syncope, no numbness and no weakness. She tried nothing for the symptoms. Risk factors include no known risk factors.  Pertinent negatives for past medical history include no CAD, no diabetes, no hypertension and no MI.  Procedure history is positive for echocardiogram and exercise treadmill test.  Procedure history is negative for cardiac catheterization.     Past  Medical History  Diagnosis Date  . Asthma   . Osteoporosis     Past Surgical History  Procedure Date  . Rotator cuff repair   . Cholecystectomy     No family history on file.  History  Substance Use Topics  . Smoking status: Never Smoker   . Smokeless tobacco: Not on file  . Alcohol Use: No    OB History    Grav Para Term Preterm Abortions TAB SAB Ect Mult Living                  Review of Systems  Constitutional: Negative.  Negative for diaphoresis.  HENT: Negative.   Eyes: Negative.   Respiratory: Negative.  Negative for shortness of breath.   Cardiovascular: Positive for chest pain. Negative for palpitations and near-syncope.  Gastrointestinal: Negative.  Negative for nausea and vomiting.  Genitourinary: Negative.   Musculoskeletal: Negative.   Skin: Negative.   Neurological: Negative.  Negative for weakness and numbness.    Allergies  Cephalexin and Rofecoxib  Home Medications   Current Outpatient Rx  Name Route Sig Dispense Refill  . ALBUTEROL SULFATE HFA 108 (90 BASE) MCG/ACT IN AERS Inhalation Inhale 2 puffs into the lungs every 4 (four) hours as needed for wheezing or shortness of breath. 1 Inhaler 0  . FUROSEMIDE 20 MG PO TABS Oral Take 20 mg by mouth daily as needed.      Marland Kitchen HYDROCODONE-ACETAMINOPHEN 5-325 MG PO TABS Oral Take 1 tablet by mouth every 6 (six) hours as needed.        BP 133/55  Pulse 96  Temp(Src) 98.1 F (36.7 C) (  Oral)  Resp 20  SpO2 99%  Physical Exam  Nursing note and vitals reviewed. Constitutional: She is oriented to person, place, and time. She appears well-developed and well-nourished.  HENT:  Head: Normocephalic and atraumatic.  Eyes: Conjunctivae and EOM are normal. Pupils are equal, round, and reactive to light.  Neck: Normal range of motion.  Cardiovascular: Normal rate, regular rhythm, S1 normal, S2 normal and normal heart sounds.   No murmur heard.      ECG: NSR, rate 78, no ST changes, no T wave abnormalities    Pulmonary/Chest: Effort normal and breath sounds normal. She has no decreased breath sounds. She has no wheezes. She has no rhonchi.  Musculoskeletal: Normal range of motion.  Neurological: She is alert and oriented to person, place, and time.  Skin: Skin is warm and dry.  Psychiatric: Her behavior is normal.    ED Course  Procedures (including critical care time)  Labs Reviewed - No data to display No results found.   1. Atypical chest pain       MDM  ECG reviewed, normal, no hx of cardiac disease, negative stress test in 2012, low TIMI score; return precautions given        Renaee Munda, MD 09/01/11 1352

## 2011-08-30 NOTE — ED Notes (Signed)
Pt c/o sharp mid sternal chest pain that started last pm.  States she continues to have intermittent pain today.  Currently rates as 3/10.  Pain is non-radiating, pt denies SOB, n/v or diaphoresis.  Reports she has been under a lot of stress and has a lot going on. No hx of heart disease.

## 2011-08-30 NOTE — Discharge Instructions (Signed)
Your EKG and examination were unremarkable today, and not suspicious for any cardiac abnormality. Using average numbers with your vital signs and age, your 10 year-risk of a cardiac event is 2% ([place] 10-yr risk); your 14-day risk factor for a cardiac event is 5% (TIMI score of 0). Given these statistics, it is unlikely that your chest discomfort is due to your heart. However, return to care should your symptoms not improve, or worsen in any way, such as persistent chest pain, shortness of breath, sweating, nausea, or other symptoms that concern you.

## 2011-09-19 ENCOUNTER — Emergency Department (HOSPITAL_COMMUNITY)
Admission: EM | Admit: 2011-09-19 | Discharge: 2011-09-19 | Disposition: A | Discharge status: Home / Self Care | Payer: BC Managed Care – PPO | Attending: Emergency Medicine | Admitting: Emergency Medicine

## 2011-09-19 ENCOUNTER — Encounter (HOSPITAL_COMMUNITY): Payer: Self-pay | Admitting: *Deleted

## 2011-09-19 DIAGNOSIS — R509 Fever, unspecified: Secondary | ICD-10-CM | POA: Insufficient documentation

## 2011-09-19 DIAGNOSIS — R51 Headache: Secondary | ICD-10-CM | POA: Insufficient documentation

## 2011-09-19 DIAGNOSIS — R059 Cough, unspecified: Secondary | ICD-10-CM | POA: Insufficient documentation

## 2011-09-19 DIAGNOSIS — R05 Cough: Secondary | ICD-10-CM | POA: Insufficient documentation

## 2011-09-19 DIAGNOSIS — J40 Bronchitis, not specified as acute or chronic: Secondary | ICD-10-CM | POA: Insufficient documentation

## 2011-09-19 LAB — POCT I-STAT, CHEM 8
Calcium, Ion: 1.15 mmol/L (ref 1.12–1.32)
Chloride: 100 mEq/L (ref 96–112)
HCT: 47 % — ABNORMAL HIGH (ref 36.0–46.0)
Sodium: 138 mEq/L (ref 135–145)
TCO2: 25 mmol/L (ref 0–100)

## 2011-09-19 MED ORDER — ONDANSETRON HCL 4 MG/2ML IJ SOLN
4.0000 mg | Freq: Once | INTRAMUSCULAR | Status: AC
  Administered 2011-09-19: 4 mg via INTRAVENOUS
  Filled 2011-09-19: qty 2

## 2011-09-19 MED ORDER — ACETAMINOPHEN 325 MG PO TABS
975.0000 mg | ORAL_TABLET | Freq: Once | ORAL | Status: AC
  Administered 2011-09-19: 975 mg via ORAL

## 2011-09-19 MED ORDER — ALBUTEROL SULFATE (5 MG/ML) 0.5% IN NEBU
5.0000 mg | INHALATION_SOLUTION | Freq: Once | RESPIRATORY_TRACT | Status: AC
  Administered 2011-09-19: 5 mg via RESPIRATORY_TRACT
  Filled 2011-09-19: qty 1

## 2011-09-19 MED ORDER — ACETAMINOPHEN 325 MG PO TABS
ORAL_TABLET | ORAL | Status: AC
  Filled 2011-09-19: qty 4

## 2011-09-19 MED ORDER — ACETAMINOPHEN 500 MG PO TABS
1000.0000 mg | ORAL_TABLET | Freq: Once | ORAL | Status: DC
  Filled 2011-09-19: qty 2

## 2011-09-19 MED ORDER — ONDANSETRON HCL 4 MG PO TABS: 8.0000 mg | ORAL_TABLET | Freq: Three times a day (TID) | ORAL | Status: AC | PRN

## 2011-09-19 MED ORDER — POTASSIUM CHLORIDE CRYS ER 20 MEQ PO TBCR
40.0000 meq | EXTENDED_RELEASE_TABLET | Freq: Once | ORAL | Status: AC
  Administered 2011-09-19: 40 meq via ORAL
  Filled 2011-09-19: qty 2

## 2011-09-19 MED ORDER — SODIUM CHLORIDE 0.9 % IV BOLUS (SEPSIS)
1000.0000 mL | Freq: Once | INTRAVENOUS | Status: AC
  Administered 2011-09-19: 1000 mL via INTRAVENOUS

## 2011-09-19 NOTE — ED Provider Notes (Addendum)
History  This chart was scribed for Doug Sou, MD by Bennett Scrape. This patient was seen in room STRE8/STRE8 and the patient's care was started at 2:08PM.  CSN: [id number]  Arrival date & time 09/19/11  1233   First MD Initiated Contact with Patient 09/19/11 1408      Chief Complaint  Patient presents with  . Headache    Patient is a 66 y.o. female presenting with headaches. The history is provided by the patient. No language interpreter was used.  Headache  This is a new problem. The current episode started 2 days ago. The problem occurs constantly. The problem has not changed since onset.The headache is associated with nothing. The pain is located in the bilateral and parietal region. The pain does not radiate. Associated symptoms include a fever. Pertinent negatives include no shortness of breath, no nausea and no vomiting. She has tried nothing for the symptoms.    YARIMA PENMAN is a 66 y.o. female with a h/o asthma and osteoporosis who presents to the Emergency Department complaining of 2 days of constant HA gradual in onset located along the top of the head with associated nausea. She is nauseated now. Pt also c/o 3 days of nasal congestion and productive cough of yellow sputum. Pt thinks that she might have had a fever but did not measure it at home. Pt was seen by PCP last week and was prescribed hydrocodone. She states that she took one dose 2 days ago but it did not help. She reports using her inhaler with improvement in her cough. She denies taking OTC medications to improve her symptoms. She denies smoking and alcohol use.   PCP is Dr Loleta Chance with the [facility].  Past Medical History  Diagnosis Date  . Asthma   . Osteoporosis     Past Surgical History  Procedure Date  . Rotator cuff repair   . Cholecystectomy     No family history on file.  History  Substance Use Topics  . Smoking status: Never Smoker   . Smokeless tobacco: Not on file  . Alcohol  Use: No     Review of Systems  Constitutional: Positive for fever, chills and appetite change.  HENT: Positive for congestion. Negative for sore throat.   Respiratory: Positive for cough. Negative for shortness of breath.   Gastrointestinal: Negative for nausea and vomiting.  Neurological: Positive for headaches. Negative for weakness.    Allergies  Cephalexin; Omeprazole; and Rofecoxib  Home Medications   Current Outpatient Rx  Name Route Sig Dispense Refill  . ALBUTEROL SULFATE HFA 108 (90 BASE) MCG/ACT IN AERS Inhalation Inhale 2 puffs into the lungs every 4 (four) hours as needed. For shortness of breath/cough    . ASPIRIN EC 81 MG PO TBEC Oral Take 81 mg by mouth daily.    . FUROSEMIDE 20 MG PO TABS Oral Take 20 mg by mouth daily as needed. For fluid    . HYDROCODONE-ACETAMINOPHEN 5-325 MG PO TABS Oral Take 1 tablet by mouth daily as needed. For pain    . NASAL SALINE NA Nasal Place 2 sprays into the nose daily as needed. Stuffy nose      Triage Vitals: BP 148/84  Pulse 109  Temp(Src) 98.3 F (36.8 C) (Oral)  Resp 18  SpO2 97%  Physical Exam  Nursing note and vitals reviewed. Constitutional: She is oriented to person, place, and time. She appears well-developed and well-nourished. No distress.  HENT:  Head: Normocephalic and atraumatic.  Eyes: EOM are normal.  Neck: Neck supple. No tracheal deviation present.  Cardiovascular: Normal rate.   Pulmonary/Chest: Effort normal. No respiratory distress. She has no wheezes. She has no rales.       Diffuse scant rhonchi  Musculoskeletal: Normal range of motion.  Neurological: She is alert and oriented to person, place, and time.  Skin: Skin is warm and dry.  Psychiatric: She has a normal mood and affect. Her behavior is normal.    ED Course  Procedures (including critical care time) 3:55 PM patient feels much improved after treatment with intravenous fluids, breathing is normal after treatment with albuterol nebulizer,  and she is no longer nauseated after treatment with Zofran DIAGNOSTIC STUDIES: Oxygen Saturation is 97% on room air, normal by my interpretation.    COORDINATION OF CARE: 2:13PM-Discussed treatment plan of tylenol, antinausea medication and a breathing treatment with pt and pt agreed to plan. 3:58PM-Pt rechecked and states that she is feeling better after the breathing treatment. Discussed discharge plan with pt and pt agreed.  Labs Reviewed - No data to display No results found.   No diagnosis found.   Results for orders placed during the hospital encounter of 09/19/11  POCT I-STAT, CHEM 8      Component Value Range   Sodium 138  135 - 145 (mEq/L)   Potassium 2.9 (*) 3.5 - 5.1 (mEq/L)   Chloride 100  96 - 112 (mEq/L)   BUN 4 (*) 6 - 23 (mg/dL)   Creatinine, Ser 1.61  0.50 - 1.10 (mg/dL)   Glucose, Bld 096 (*) 70 - 99 (mg/dL)   Calcium, Ion 0.45  4.09 - 1.32 (mmol/L)   TCO2 25  0 - 100 (mmol/L)   Hemoglobin 16.0 (*) 12.0 - 15.0 (g/dL)   HCT 81.1 (*) 91.4 - 46.0 (%)   No results found.  MDM  Suspect viral etiology of symptoms with nasal congestion cough no fever Plan prescription for Zofran, patient has pro air inhaler which she can use 2 puffs every 4 hours as needed for shortness of breath or cough Followup Dr. Loleta Chance next week to get serum potassium rechecked  KCl40 meqwas by mouth prior to discharge Diagnosis #1 bronchitis #2 hypokalemia #3 mild dehydration           Doug Sou, MD 09/19/11 1603  Doug Sou, MD 09/19/11 2115

## 2011-09-19 NOTE — ED Notes (Signed)
Headache since Tuesday that was located to top of head.  Pt has tried mucinex.  Pt sound congested and reports sputum coming out of nose and coughing up thick yellow sputum.  Pt reports chills and decreased appetite.

## 2011-09-19 NOTE — Discharge Instructions (Signed)
Bronchitis  Usual pro air inhaler 2 puffs every 4 hours as needed for shortness of breath or cough. Take Tylenol as directed for pain. Drink lots of fluids you're mildly dehydrated today. Call Dr. Loleta Chance to arrange to be seen in the office next week. Ask him to recheck your blood potassium level. Today's was low at 2.9. Did not take any Lasix(furosemide) today as you are mildly dehydrated. Return if her condition worsens for any reason. Bronchitis is a problem of the air tubes leading to your lungs. This problem makes it hard for air to get in and out of the lungs. You may cough a lot because your air tubes are narrow. Going without care can cause lasting (chronic) bronchitis. HOME CARE   Drink enough fluids to keep your pee (urine) clear or pale yellow.   Use a cool mist humidifier.   Quit smoking if you smoke. If you keep smoking, the bronchitis might not get better.   Only take medicine as told by your doctor.  GET HELP RIGHT AWAY IF:   Coughing keeps you awake.   You start to wheeze.   You become more sick or weak.   You have a hard time breathing or get short of breath.   You cough up blood.   Coughing lasts more than 2 weeks.   You have a fever.   Your baby is older than 3 months with a rectal temperature of 102 F (38.9 C) or higher.   Your baby is 9 months old or younger with a rectal temperature of 100.4 F (38 C) or higher.  MAKE SURE YOU:  Understand these instructions.   Will watch your condition.   Will get help right away if you are not doing well or get worse.  Document Released: 10/02/2007 Document Revised: 04/04/2011 Document Reviewed: 03/17/2009 [facility] Patient Information 2012 [place], [place].

## 2011-09-25 ENCOUNTER — Encounter: Payer: Self-pay | Admitting: Cardiology

## 2011-09-26 ENCOUNTER — Ambulatory Visit (INDEPENDENT_AMBULATORY_CARE_PROVIDER_SITE_OTHER): Payer: BC Managed Care – PPO | Admitting: Cardiology

## 2011-09-26 ENCOUNTER — Encounter: Payer: Self-pay | Admitting: Cardiology

## 2011-09-26 VITALS — BP 122/81 | HR 69 | Resp 18 | Ht 65.0 in | Wt 245.0 lb

## 2011-09-26 DIAGNOSIS — G4733 Obstructive sleep apnea (adult) (pediatric): Secondary | ICD-10-CM | POA: Insufficient documentation

## 2011-09-26 DIAGNOSIS — E785 Hyperlipidemia, unspecified: Secondary | ICD-10-CM

## 2011-09-26 DIAGNOSIS — R0789 Other chest pain: Secondary | ICD-10-CM | POA: Insufficient documentation

## 2011-09-26 DIAGNOSIS — R072 Precordial pain: Secondary | ICD-10-CM

## 2011-09-26 DIAGNOSIS — J209 Acute bronchitis, unspecified: Secondary | ICD-10-CM

## 2011-09-26 NOTE — Patient Instructions (Signed)
**Note De-Identified Miloh Alcocer Obfuscation** Your physician recommends that you continue on your current medications as directed. Please refer to the Current Medication list given to you today.  Your physician has requested that you have a lexiscan myoview. For further information please visit [email or web address] Please follow instruction sheet, as given.  Your physician recommends that you schedule a follow-up appointment in: we will contact you with test results

## 2011-09-26 NOTE — Assessment & Plan Note (Signed)
Followed by Dr. Loleta Chance. Currently not on statin therapy.

## 2011-09-26 NOTE — Progress Notes (Signed)
Clinical Summary Ms. Natividad is a 66 y.o.female referred for cardiology consultation by Dr. Loleta Chance for assessment of chest pain. She reports intermittent, sporatic episodes of chest pain - relatively sharp in nature, sometimes intense, mid sternal. They are not exertional in nature. She reports prior stress testing greater than 5 years ago.  She has a history of hyperlipidemia and OSA, also her mother had an MI in her 9's. She is concerned about the status of her heart.  ECG today is normal.  She reports significant problems with knee arthritis, right worse than left, limiting her ambulation.  She has recently been diagnosed with bronchitis, nonproductive cough, occasional wheezing. She has had a course of antibiotics.   Allergies  Allergen Reactions  . Cephalexin Hives  . Omeprazole Hives  . Rofecoxib Rash    Current Outpatient Prescriptions  Medication Sig Dispense Refill  . acetaminophen (TYLENOL) 500 MG tablet Take 500 mg by mouth every 6 (six) hours as needed.      Marland Kitchen albuterol (PROVENTIL HFA;VENTOLIN HFA) 108 (90 BASE) MCG/ACT inhaler Inhale 2 puffs into the lungs every 4 (four) hours as needed. For shortness of breath/cough      . aspirin EC 81 MG tablet Take 81 mg by mouth daily.      Marland Kitchen HYDROcodone-acetaminophen (NORCO) 5-325 MG per tablet Take 1 tablet by mouth daily as needed. For pain      . ondansetron (ZOFRAN) 4 MG tablet Take 2 tablets (8 mg total) by mouth every 8 (eight) hours as needed for nausea.  20 tablet  0    Past Medical History  Diagnosis Date  . Osteoarthritis   . Mixed hyperlipidemia   . GERD (gastroesophageal reflux disease)   . Obstructive sleep apnea   . Chronic bronchitis     Past Surgical History  Procedure Date  . Rotator cuff repair   . Cholecystectomy     Family History  Problem Relation Age of Onset  . Cancer Father   . Cancer Mother   . Cancer Brother   . Heart attack Mother     Social History Ms. Pohl reports that she has  never smoked. She has never used smokeless tobacco. Ms. Kerper reports that she does not drink alcohol.  Review of Systems No palpitations or syncope. Cough and wheezing as noted above. Stable appetite. Bilateral knee pain related to arthritis. No orthopnea or PND. Otherwise negative.  Physical Examination Filed Vitals:   09/26/11 1453  BP: 122/81  Pulse: 69  Resp: 18   Obese woman in no acute distress. HEENT: Conjunctiva and lids normal, oropharynx clear. Neck: Supple, no elevated JVP or carotid bruits, no thyromegaly. Lungs: Decreased breath sounds but no wheezes, nonlabored breathing at rest. Cardiac: Regular rate and rhythm, no S3 or significant systolic murmur, no pericardial rub. Abdomen: Soft, nontender, bowel sounds present, no guarding or rebound. Extremities: No pitting edema, distal pulses 2+. Skin: Warm and dry. Musculoskeletal: No kyphosis. Neuropsychiatric: Alert and oriented x3, affect grossly appropriate.   ECG Normal sinus rhythm.   Problem List and Plan   Precordial pain Atypical in description. Cardiac risk factors include obesity, hyperlipidemia, obstructive sleep apnea, also family history of premature CAD in her mother. Her resting ECG is normal. Ambulation is limited by significant, bilateral knee arthritis. For further risk stratification Lexiscan Myoview will be obtained, although will defer this for a few weeks until she gets over her bronchitis.  HYPERLIPIDEMIA Followed by Dr. Loleta Chance. Currently not on statin therapy.  ACUTE BRONCHITIS  Noted within last few weeks. Chest pain symptoms preceded this.     Jonelle Sidle, M.D., F.A.C.C.

## 2011-09-26 NOTE — Assessment & Plan Note (Signed)
Noted within last few weeks. Chest pain symptoms preceded this.

## 2011-09-26 NOTE — Assessment & Plan Note (Signed)
Atypical in description. Cardiac risk factors include obesity, hyperlipidemia, obstructive sleep apnea, also family history of premature CAD in her mother. Her resting ECG is normal. Ambulation is limited by significant, bilateral knee arthritis. For further risk stratification Lexiscan Myoview will be obtained, although will defer this for a few weeks until she gets over her bronchitis.

## 2011-10-09 ENCOUNTER — Ambulatory Visit (INDEPENDENT_AMBULATORY_CARE_PROVIDER_SITE_OTHER): Payer: BC Managed Care – PPO

## 2011-10-09 ENCOUNTER — Encounter (HOSPITAL_COMMUNITY)
Admission: RE | Admit: 2011-10-09 | Discharge: 2011-10-09 | Disposition: A | Discharge status: Home / Self Care | Payer: BC Managed Care – PPO | Source: Ambulatory Visit | Attending: Cardiology | Admitting: Cardiology

## 2011-10-09 ENCOUNTER — Encounter (HOSPITAL_COMMUNITY): Payer: Self-pay | Admitting: Cardiology

## 2011-10-09 ENCOUNTER — Encounter (HOSPITAL_COMMUNITY): Payer: Self-pay

## 2011-10-09 DIAGNOSIS — R0789 Other chest pain: Secondary | ICD-10-CM | POA: Insufficient documentation

## 2011-10-09 DIAGNOSIS — R072 Precordial pain: Secondary | ICD-10-CM

## 2011-10-09 HISTORY — DX: Unspecified asthma, uncomplicated: J45.909

## 2011-10-09 MED ORDER — TECHNETIUM TC 99M TETROFOSMIN IV KIT
30.0000 | PACK | Freq: Once | INTRAVENOUS | Status: AC | PRN
  Administered 2011-10-09: 30 via INTRAVENOUS

## 2011-10-09 MED ORDER — TECHNETIUM TC 99M TETROFOSMIN IV KIT
10.0000 | PACK | Freq: Once | INTRAVENOUS | Status: AC | PRN
  Administered 2011-10-09: 9.8 via INTRAVENOUS

## 2011-10-09 NOTE — Progress Notes (Signed)
Stress Lab Nurses Notes - [facility]  AMYRA VANTUYL 10/09/2011 Reason for doing test: Chest Pain Type of test: Steffanie Dunn Nurse performing test: Parke Poisson, RN Nuclear Medicine Tech: Lyndel Pleasure Echo Tech: Not Applicable MD performing test: R. Rothbart  & Joni Reining NP Family MD: [facility] explained and consent signed: yes IV started: 22g jelco, Saline lock flushed, No redness or edema and Saline lock started in radiology Symptoms: headache & stomach discomfort Treatment/Intervention: None Reason test stopped: protocol completed After recovery IV was: Discontinued via X-ray tech and No redness or edema Patient to return to Nuc. Med at : 12:00 Patient discharged: Home Patient's Condition upon discharge was: stable Comments: During test BP 118/60 & HR 106.  Recovery BP 110/58 & HR 92.  Symptom resolved in recovery. Erskine Speed T

## 2013-05-15 ENCOUNTER — Encounter (HOSPITAL_COMMUNITY): Payer: Self-pay | Admitting: Emergency Medicine

## 2013-05-15 ENCOUNTER — Emergency Department (HOSPITAL_COMMUNITY)
Admission: EM | Admit: 2013-05-15 | Discharge: 2013-05-15 | Disposition: A | Discharge status: Home / Self Care | Payer: Medicare Other | Source: Home / Self Care

## 2013-05-15 ENCOUNTER — Emergency Department (INDEPENDENT_AMBULATORY_CARE_PROVIDER_SITE_OTHER): Payer: Medicare Other

## 2013-05-15 DIAGNOSIS — J45901 Unspecified asthma with (acute) exacerbation: Secondary | ICD-10-CM

## 2013-05-15 DIAGNOSIS — J40 Bronchitis, not specified as acute or chronic: Secondary | ICD-10-CM

## 2013-05-15 DIAGNOSIS — J329 Chronic sinusitis, unspecified: Secondary | ICD-10-CM

## 2013-05-15 LAB — POCT RAPID STREP A: STREPTOCOCCUS, GROUP A SCREEN (DIRECT): NEGATIVE

## 2013-05-15 MED ORDER — DOXYCYCLINE HYCLATE 100 MG PO CAPS: 100.0000 mg | ORAL_CAPSULE | Freq: Two times a day (BID) | ORAL | Status: DC

## 2013-05-15 MED ORDER — PREDNISONE 20 MG PO TABS: 60.0000 mg | ORAL_TABLET | Freq: Every day | ORAL | Status: DC

## 2013-05-15 MED ORDER — IPRATROPIUM BROMIDE 0.02 % IN SOLN
0.5000 mg | Freq: Once | RESPIRATORY_TRACT | Status: AC
  Administered 2013-05-15: 0.5 mg via RESPIRATORY_TRACT

## 2013-05-15 MED ORDER — ALBUTEROL SULFATE (2.5 MG/3ML) 0.083% IN NEBU
INHALATION_SOLUTION | RESPIRATORY_TRACT | Status: AC
  Filled 2013-05-15: qty 6

## 2013-05-15 MED ORDER — ALBUTEROL SULFATE HFA 108 (90 BASE) MCG/ACT IN AERS: 1.0000 | INHALATION_SPRAY | Freq: Four times a day (QID) | RESPIRATORY_TRACT | Status: DC | PRN

## 2013-05-15 MED ORDER — HYDROCOD POLST-CHLORPHEN POLST 10-8 MG/5ML PO LQCR: 5.0000 mL | Freq: Two times a day (BID) | ORAL | Status: DC | PRN

## 2013-05-15 MED ORDER — IPRATROPIUM BROMIDE 0.02 % IN SOLN
RESPIRATORY_TRACT | Status: AC
  Filled 2013-05-15: qty 2.5

## 2013-05-15 MED ORDER — ALBUTEROL SULFATE (2.5 MG/3ML) 0.083% IN NEBU
5.0000 mg | INHALATION_SOLUTION | Freq: Once | RESPIRATORY_TRACT | Status: AC
  Administered 2013-05-15: 5 mg via RESPIRATORY_TRACT

## 2013-05-15 NOTE — ED Provider Notes (Signed)
CSN: [id number]     Arrival date & time 05/15/13  1359 History   None    Chief Complaint  Patient presents with  . URI   (Consider location/radiation/quality/duration/timing/severity/associated sxs/prior Treatment)  HPI   The patient is a 68 year old female presenting today with reports cough and congestion as one week.  Patient states her symptoms have become progressively worse. Patient reports anterior wall chest pain that is significantly worse with coughing. Patient has had some shortness of breath and is unable to lie flat at night. Patient reports productive cough with yellow blood tinged sputum. Patient has a headache and reports severe frontal sinus pressure along with a probable fever this patient reports sweats and chills. Patient has a history of frequent bronchitis. She is positive for nausea while negative for vomiting.  Denies pedal swelling or edema.  Denies radiation of chest pain or chest pain while not coughing. The patient denies wheezing, but states she has been using her "breathing inhaler" for shortness of breath as needed.  States has used twice this week.  Past Medical History  Diagnosis Date  . Osteoarthritis   . Mixed hyperlipidemia   . GERD (gastroesophageal reflux disease)   . Obstructive sleep apnea   . Chronic bronchitis   . Asthma    Past Surgical History  Procedure Laterality Date  . Rotator cuff repair    . Cholecystectomy     Family History  Problem Relation Age of Onset  . Cancer Father   . Cancer Mother   . Cancer Brother   . Heart attack Mother    History  Substance Use Topics  . Smoking status: Never Smoker   . Smokeless tobacco: Never Used  . Alcohol Use: No   OB History   Grav Para Term Preterm Abortions TAB SAB Ect Mult Living                 Review of Systems  Constitutional: Positive for fever, chills, diaphoresis and fatigue.  HENT: Positive for congestion, sinus pressure and sore throat.   Eyes: Negative.   Respiratory:  Positive for cough, shortness of breath and wheezing. Negative for chest tightness.   Cardiovascular: Positive for chest pain. Negative for palpitations and leg swelling.       Patient reports generalized anterior chest wall discomfort with coughing.  Gastrointestinal: Positive for nausea. Negative for vomiting, abdominal pain, diarrhea and constipation.  Endocrine: Negative.   Genitourinary: Negative.   Musculoskeletal: Positive for arthralgias.       Each and states she has been "aching all over", but states that her joints frequently ache due to history of arthritis.  Skin: Negative.   Allergic/Immunologic: Negative.   Neurological: Positive for headaches. Negative for dizziness, weakness and numbness.  Hematological: Negative.   Psychiatric/Behavioral: Negative.     Allergies  Cephalexin; Omeprazole; and Rofecoxib  Home Medications   Current Outpatient Rx  Name  Route  Sig  Dispense  Refill  . acetaminophen (TYLENOL) 500 MG tablet   Oral   Take 500 mg by mouth every 6 (six) hours as needed.         Marland Kitchen EXPIRED: albuterol (PROVENTIL HFA;VENTOLIN HFA) 108 (90 BASE) MCG/ACT inhaler   Inhalation   Inhale 2 puffs into the lungs every 4 (four) hours as needed. For shortness of breath/cough         . albuterol (PROVENTIL HFA;VENTOLIN HFA) 108 (90 BASE) MCG/ACT inhaler   Inhalation   Inhale 1-2 puffs into the lungs every 6 (six)  hours as needed for wheezing or shortness of breath.   1 Inhaler   2   . aspirin EC 81 MG tablet   Oral   Take 81 mg by mouth daily.         . chlorpheniramine-HYDROcodone (TUSSIONEX PENNKINETIC ER) 10-8 MG/5ML LQCR   Oral   Take 5 mLs by mouth every 12 (twelve) hours as needed for cough.   115 mL   0   . doxycycline (VIBRAMYCIN) 100 MG capsule   Oral   Take 1 capsule (100 mg total) by mouth 2 (two) times daily.   20 capsule   0   . HYDROcodone-acetaminophen (NORCO) 5-325 MG per tablet   Oral   Take 1 tablet by mouth daily as needed. For  pain         . predniSONE (DELTASONE) 20 MG tablet   Oral   Take 3 tablets (60 mg total) by mouth daily.   15 tablet   0    BP 118/80  Pulse 71  Temp(Src) 98.6 F (37 C) (Oral)  Resp 20  SpO2 96%  Physical Exam  Nursing note and vitals reviewed. Constitutional: She is oriented to person, place, and time. She appears well-developed and well-nourished. No distress.  HENT:  Head: Normocephalic and atraumatic.  Right Ear: External ear normal.  Left Ear: External ear normal.  Nose: Nose normal.  Mouth/Throat: Oropharynx is clear and moist. No oropharyngeal exudate.  Eyes: Conjunctivae and EOM are normal. Pupils are equal, round, and reactive to light. Right eye exhibits no discharge. Left eye exhibits no discharge. No scleral icterus.  Neck: Normal range of motion. Neck supple.  Anterior cervical lymphadenopathy noted. No nuchal rigidity.  Cardiovascular: Normal rate, regular rhythm, normal heart sounds and intact distal pulses.  Exam reveals no gallop and no friction rub.   No murmur heard. No pedal edema and 2+ dorsalis pedal pulses present.  Pulmonary/Chest: Effort normal.  No adventitious breath sounds noted; negative for egophony. Patient has diminished breath sounds throughout.  Musculoskeletal: Normal range of motion. She exhibits no edema and no tenderness.  Lymphadenopathy:    She has cervical adenopathy.  Neurological: She is alert and oriented to person, place, and time.  Skin: Skin is warm and dry. No rash noted. She is not diaphoretic. No erythema. No pallor.   Patient states she's feeling much better following nebulizer treatment. Increased air exchange heard in all lobes.  ED Course  Procedures (including critical care time) Labs Review Labs Reviewed  POCT RAPID STREP A [facility] URG CARE ONLY)   Imaging Review Dg Chest 2 View  05/15/2013   CLINICAL DATA:  Cough.  History of chronic bronchitis.  EXAM: CHEST  2 VIEW  COMPARISON:  Two-view chest 04/28/2011   FINDINGS: Heart size is normal. Chronic perihilar bronchitic changes are similar to the prior exam. No focal airspace disease is evident. The visualized soft tissues and bony thorax are unremarkable.  IMPRESSION: 1. Perihilar bronchitic changes are similar to prior exam and likely chronic. An acute exacerbation is not excluded. 2. No focal airspace disease to suggest bronchopneumonia.   Electronically Signed   By: Lawrence Santiago M.D.   On: 05/15/2013 17:06     MDM   1. Sinusitis   2. Bronchitis   3. Asthma exacerbation    Meds ordered this encounter  Medications  . albuterol (PROVENTIL) (2.5 MG/3ML) 0.083% nebulizer solution 5 mg    Sig:    And  . ipratropium (ATROVENT) nebulizer solution 0.5  mg    Sig:   . doxycycline (VIBRAMYCIN) 100 MG capsule    Sig: Take 1 capsule (100 mg total) by mouth 2 (two) times daily.    Dispense:  20 capsule    Refill:  0  . predniSONE (DELTASONE) 20 MG tablet    Sig: Take 3 tablets (60 mg total) by mouth daily.    Dispense:  15 tablet    Refill:  0  . albuterol (PROVENTIL HFA;VENTOLIN HFA) 108 (90 BASE) MCG/ACT inhaler    Sig: Inhale 1-2 puffs into the lungs every 6 (six) hours as needed for wheezing or shortness of breath.    Dispense:  1 Inhaler    Refill:  2  . chlorpheniramine-HYDROcodone (TUSSIONEX PENNKINETIC ER) 10-8 MG/5ML LQCR    Sig: Take 5 mLs by mouth every 12 (twelve) hours as needed for cough.    Dispense:  115 mL    Refill:  0   Patient to follow up with her primary care provider on Monday for request of home nebulizer machine. Patient verbalizes understanding of plan of care.    Jacqualyn Posey, NP 05/15/13 1754

## 2013-05-15 NOTE — Discharge Instructions (Signed)
Sinusitis Sinusitis is redness, soreness, and swelling (inflammation) of the paranasal sinuses. Paranasal sinuses are air pockets within the bones of your face (beneath the eyes, the middle of the forehead, or above the eyes). In healthy paranasal sinuses, mucus is able to drain out, and air is able to circulate through them by way of your nose. However, when your paranasal sinuses are inflamed, mucus and air can become trapped. This can allow bacteria and other germs to grow and cause infection. Sinusitis can develop quickly and last only a short time (acute) or continue over a long period (chronic). Sinusitis that lasts for more than 12 weeks is considered chronic.  CAUSES  Causes of sinusitis include:  Allergies.  Structural abnormalities, such as displacement of the cartilage that separates your nostrils (deviated septum), which can decrease the air flow through your nose and sinuses and affect sinus drainage.  Functional abnormalities, such as when the small hairs (cilia) that line your sinuses and help remove mucus do not work properly or are not present. SYMPTOMS  Symptoms of acute and chronic sinusitis are the same. The primary symptoms are pain and pressure around the affected sinuses. Other symptoms include:  Upper toothache.  Earache.  Headache.  Bad breath.  Decreased sense of smell and taste.  A cough, which worsens when you are lying flat.  Fatigue.  Fever.  Thick drainage from your nose, which often is green and may contain pus (purulent).  Swelling and warmth over the affected sinuses. DIAGNOSIS  Your caregiver will perform a physical exam. During the exam, your caregiver may:  Look in your nose for signs of abnormal growths in your nostrils (nasal polyps).  Tap over the affected sinus to check for signs of infection.  View the inside of your sinuses (endoscopy) with a special imaging device with a light attached (endoscope), which is inserted into your  sinuses. If your caregiver suspects that you have chronic sinusitis, one or more of the following tests may be recommended:  Allergy tests.  Nasal culture A sample of mucus is taken from your nose and sent to a lab and screened for bacteria.  Nasal cytology A sample of mucus is taken from your nose and examined by your caregiver to determine if your sinusitis is related to an allergy. TREATMENT  Most cases of acute sinusitis are related to a viral infection and will resolve on their own within 10 days. Sometimes medicines are prescribed to help relieve symptoms (pain medicine, decongestants, nasal steroid sprays, or saline sprays).  However, for sinusitis related to a bacterial infection, your caregiver will prescribe antibiotic medicines. These are medicines that will help kill the bacteria causing the infection.  Rarely, sinusitis is caused by a fungal infection. In theses cases, your caregiver will prescribe antifungal medicine. For some cases of chronic sinusitis, surgery is needed. Generally, these are cases in which sinusitis recurs more than 3 times per year, despite other treatments. HOME CARE INSTRUCTIONS   Drink plenty of water. Water helps thin the mucus so your sinuses can drain more easily.  Use a humidifier.  Inhale steam 3 to 4 times a day (for example, sit in the bathroom with the shower running).  Apply a warm, moist washcloth to your face 3 to 4 times a day, or as directed by your caregiver.  Use saline nasal sprays to help moisten and clean your sinuses.  Take over-the-counter or prescription medicines for pain, discomfort, or fever only as directed by your caregiver. [place]  CARE IF:  You have increasing pain or severe headaches.  You have nausea, vomiting, or drowsiness.  You have swelling around your face.  You have vision problems.  You have a stiff neck.  You have difficulty breathing. MAKE SURE YOU:   Understand these  instructions.  Will watch your condition.  Will get help right away if you are not doing well or get worse. Document Released: 04/15/2005 Document Revised: 07/08/2011 Document Reviewed: 04/30/2011 [facility] Patient Information 2014 [place], [place]. Asthma, Adult Asthma is a recurring condition in which the airways tighten and narrow. Asthma can make it difficult to breathe. It can cause coughing, wheezing, and shortness of breath. Asthma episodes (also called asthma attacks) range from minor to life-threatening. Asthma cannot be cured, but medicines and lifestyle changes can help control it. CAUSES Asthma is believed to be caused by inherited (genetic) and environmental factors, but its exact cause is unknown. Asthma may be triggered by allergens, lung infections, or irritants in the air. Asthma triggers are different for each person. Common triggers include:   Animal dander.  Dust mites.  Cockroaches.  Pollen from trees or grass.  Mold.  Smoke.  Air pollutants such as dust, household cleaners, hair sprays, aerosol sprays, paint fumes, strong chemicals, or strong odors.  Cold air, weather changes, and winds (which increase molds and pollens in the air).  Strong emotional expressions such as crying or laughing hard.  Stress.  Certain medicines (such as aspirin) or types of drugs (such as beta-blockers).  Sulfites in foods and drinks. Foods and drinks that may contain sulfites include dried fruit, potato chips, and sparkling grape juice.  Infections or inflammatory conditions such as the flu, a cold, or an inflammation of the nasal membranes (rhinitis).  Gastroesophageal reflux disease (GERD).  Exercise or strenuous activity. SYMPTOMS Symptoms may occur immediately after asthma is triggered or many hours later. Symptoms include:  Wheezing.  Excessive nighttime or early morning coughing.  Frequent or severe coughing with a common cold.  Chest tightness.  Shortness of  breath. DIAGNOSIS  The diagnosis of asthma is made by a review of your medical history and a physical exam. Tests may also be performed. These may include:  Lung function studies. These tests show how much air you breath in and out.  Allergy tests.  Imaging tests such as X-rays. TREATMENT  Asthma cannot be cured, but it can usually be controlled. Treatment involves identifying and avoiding your asthma triggers. It also involves medicines. There are 2 classes of medicine used for asthma treatment:   Controller medicines. These prevent asthma symptoms from occurring. They are usually taken every day.  Reliever or rescue medicines. These quickly relieve asthma symptoms. They are used as needed and provide short-term relief. Your health care provider will help you create an asthma action plan. An asthma action plan is a written plan for managing and treating your asthma attacks. It includes a list of your asthma triggers and how they may be avoided. It also includes information on when medicines should be taken and when their dosage should be changed. An action plan may also involve the use of a device called a peak flow meter. A peak flow meter measures how well the lungs are working. It helps you monitor your condition. HOME CARE INSTRUCTIONS   Take medicine as directed by your health care provider. Speak with your health care provider if you have questions about how or when to take the medicines.  Use a peak flow meter as  directed by your health care provider. Record and keep track of readings.  Understand and use the action plan to help minimize or stop an asthma attack without needing to seek medical care.  Control your home environment in the following ways to help prevent asthma attacks:  Do not smoke. Avoid being exposed to secondhand smoke.  Change your heating and air conditioning filter regularly.  Limit your use of fireplaces and wood stoves.  Get rid of pests (such as roaches  and mice) and their droppings.  Throw away plants if you see mold on them.  Clean your floors and dust regularly. Use unscented cleaning products.  Try to have someone else vacuum for you regularly. Stay out of rooms while they are being vacuumed and for a short while afterward. If you vacuum, use a dust mask from a hardware store, a double-layered or microfilter vacuum cleaner bag, or a vacuum cleaner with a HEPA filter.  Replace carpet with wood, tile, or vinyl flooring. Carpet can trap dander and dust.  Use allergy-proof pillows, mattress covers, and box spring covers.  Wash bed sheets and blankets every week in hot water and dry them in a dryer.  Use blankets that are made of polyester or cotton.  Clean bathrooms and kitchens with bleach. If possible, have someone repaint the walls in these rooms with mold-resistant paint. Keep out of the rooms that are being cleaned and painted.  Wash hands frequently. SEEK MEDICAL CARE IF:   You have wheezing, shortness of breath, or a cough even if taking medicine to prevent attacks.  The colored mucus you cough up (sputum) is thicker than usual.  Your sputum changes from clear or white to yellow, green, gray, or bloody.  You have any problems that may be related to the medicines you are taking (such as a rash, itching, swelling, or trouble breathing).  You are using a reliever medicine more than 2 3 times per week.  Your peak flow is still at 50 79% of you personal best after following your action plan for 1 hour. SEEK IMMEDIATE MEDICAL CARE IF:   You seem to be getting worse and are unresponsive to treatment during an asthma attack.  You are short of breath even at rest.  You get short of breath when doing very little physical activity.  You have difficulty eating, drinking, or talking due to asthma symptoms.  You develop chest pain.  You develop a fast heartbeat.  You have a bluish color to your lips or fingernails.  You are  lightheaded, dizzy, or faint.  Your peak flow is less than 50% of your personal best.  You have a fever or persistent symptoms for more than 2 3 days.  You have a fever and symptoms suddenly get worse. MAKE SURE YOU:   Understand these instructions.  Will watch your condition.  Will get help right away if you are not doing well or get worse. Document Released: 04/15/2005 Document Revised: 12/16/2012 Document Reviewed: 11/12/2012 [facility] Patient Information 2014 [place], [place].

## 2013-05-15 NOTE — ED Provider Notes (Signed)
Medical screening examination/treatment/procedure(s) were performed by non-physician practitioner and as supervising physician I was immediately available for consultation/collaboration.  Yavuz Kirby, M.D.  Leaman Abe C Dallis Czaja, MD 05/15/13 2210 

## 2013-05-15 NOTE — ED Notes (Signed)
C/o URI type symptoms for 1 week, worse today. Productive cough w yellow secretions, wheezing bilateral

## 2013-05-18 ENCOUNTER — Telehealth (HOSPITAL_COMMUNITY): Payer: Self-pay | Admitting: *Deleted

## 2013-05-18 LAB — CULTURE, GROUP A STREP

## 2013-05-18 NOTE — ED Notes (Signed)
Throat culture: Strep beta hemolytic not group A.  Pt. treated with Doxycycline.  Discussed with Dr. Juventino Slovak. He said tx. adequate.  Pt. needs notified. Roselyn Meier 05/18/2013

## 2013-05-26 NOTE — ED Notes (Signed)
Unable to reach pt. By phone x 4.  Confidential marked letter sent with lab result and telling her she was adequately treated with Doxycycline. Roselyn Meier 05/26/2013

## 2013-06-17 ENCOUNTER — Other Ambulatory Visit: Payer: Self-pay | Admitting: Family Medicine

## 2013-06-17 ENCOUNTER — Ambulatory Visit (INDEPENDENT_AMBULATORY_CARE_PROVIDER_SITE_OTHER): Payer: Medicare Other | Admitting: Family Medicine

## 2013-06-17 ENCOUNTER — Encounter (INDEPENDENT_AMBULATORY_CARE_PROVIDER_SITE_OTHER): Payer: Self-pay

## 2013-06-17 ENCOUNTER — Encounter: Payer: Self-pay | Admitting: Family Medicine

## 2013-06-17 VITALS — BP 122/84 | HR 66 | Resp 18 | Ht 65.5 in | Wt 260.0 lb

## 2013-06-17 DIAGNOSIS — F329 Major depressive disorder, single episode, unspecified: Secondary | ICD-10-CM

## 2013-06-17 DIAGNOSIS — E559 Vitamin D deficiency, unspecified: Secondary | ICD-10-CM

## 2013-06-17 DIAGNOSIS — Z139 Encounter for screening, unspecified: Secondary | ICD-10-CM

## 2013-06-17 DIAGNOSIS — M170 Bilateral primary osteoarthritis of knee: Secondary | ICD-10-CM

## 2013-06-17 DIAGNOSIS — F32A Depression, unspecified: Secondary | ICD-10-CM

## 2013-06-17 DIAGNOSIS — Z2911 Encounter for prophylactic immunotherapy for respiratory syncytial virus (RSV): Secondary | ICD-10-CM

## 2013-06-17 DIAGNOSIS — G4733 Obstructive sleep apnea (adult) (pediatric): Secondary | ICD-10-CM

## 2013-06-17 DIAGNOSIS — Z1239 Encounter for other screening for malignant neoplasm of breast: Secondary | ICD-10-CM

## 2013-06-17 DIAGNOSIS — M25511 Pain in right shoulder: Secondary | ICD-10-CM | POA: Insufficient documentation

## 2013-06-17 DIAGNOSIS — R490 Dysphonia: Secondary | ICD-10-CM | POA: Insufficient documentation

## 2013-06-17 DIAGNOSIS — G4700 Insomnia, unspecified: Secondary | ICD-10-CM

## 2013-06-17 DIAGNOSIS — M1710 Unilateral primary osteoarthritis, unspecified knee: Secondary | ICD-10-CM

## 2013-06-17 DIAGNOSIS — M25519 Pain in unspecified shoulder: Secondary | ICD-10-CM

## 2013-06-17 DIAGNOSIS — Z1231 Encounter for screening mammogram for malignant neoplasm of breast: Secondary | ICD-10-CM

## 2013-06-17 DIAGNOSIS — F3289 Other specified depressive episodes: Secondary | ICD-10-CM

## 2013-06-17 DIAGNOSIS — M25561 Pain in right knee: Secondary | ICD-10-CM

## 2013-06-17 DIAGNOSIS — E785 Hyperlipidemia, unspecified: Secondary | ICD-10-CM

## 2013-06-17 DIAGNOSIS — R072 Precordial pain: Secondary | ICD-10-CM

## 2013-06-17 DIAGNOSIS — IMO0002 Reserved for concepts with insufficient information to code with codable children: Secondary | ICD-10-CM

## 2013-06-17 DIAGNOSIS — Z6841 Body Mass Index (BMI) 40.0 and over, adult: Secondary | ICD-10-CM

## 2013-06-17 DIAGNOSIS — M25569 Pain in unspecified knee: Secondary | ICD-10-CM

## 2013-06-17 LAB — COMPREHENSIVE METABOLIC PANEL
ALBUMIN: 4 g/dL (ref 3.5–5.2)
ALK PHOS: 90 U/L (ref 39–117)
ALT: 22 U/L (ref 0–35)
AST: 26 U/L (ref 0–37)
BUN: 6 mg/dL (ref 6–23)
CALCIUM: 9.3 mg/dL (ref 8.4–10.5)
CHLORIDE: 105 meq/L (ref 96–112)
CO2: 28 mEq/L (ref 19–32)
Creat: 0.6 mg/dL (ref 0.50–1.10)
GLUCOSE: 79 mg/dL (ref 70–99)
POTASSIUM: 3.6 meq/L (ref 3.5–5.3)
SODIUM: 141 meq/L (ref 135–145)
TOTAL PROTEIN: 7.1 g/dL (ref 6.0–8.3)
Total Bilirubin: 1.2 mg/dL (ref 0.2–1.2)

## 2013-06-17 LAB — LIPID PANEL
CHOL/HDL RATIO: 3 ratio
CHOLESTEROL: 198 mg/dL (ref 0–200)
HDL: 65 mg/dL (ref >39)
LDL Cholesterol: 117 mg/dL — ABNORMAL HIGH (ref 0–99)
Triglycerides: 78 mg/dL (ref <150)
VLDL: 16 mg/dL (ref 0–40)

## 2013-06-17 LAB — TSH: TSH: 2.52 u[IU]/mL (ref 0.350–4.500)

## 2013-06-17 MED ORDER — MONTELUKAST SODIUM 10 MG PO TABS: 10.0000 mg | ORAL_TABLET | Freq: Every day | ORAL | Status: DC

## 2013-06-17 MED ORDER — TEMAZEPAM 15 MG PO CAPS: 15.0000 mg | ORAL_CAPSULE | Freq: Every evening | ORAL | Status: DC | PRN

## 2013-06-17 MED ORDER — FLUOXETINE HCL 20 MG PO CAPS: 20.0000 mg | ORAL_CAPSULE | Freq: Every day | ORAL | Status: DC

## 2013-06-17 NOTE — Patient Instructions (Addendum)
F/u in 6 weeks, call if you need me before please  Fasting lipid, cmp, tSH, Vit D and HBa1C as son as possible  Mammogram will be scheduled for you , pls keep appt  Pneumonia vaccine and Zostavax today  New medications, fluoxetine 20mg  one daily for depression  Restoril 15 mg one at bedtime for help with sleep  Singulair 10mg  daily for allergies and asthma  You [facility] be [facility] to [facility] to re eval sleep apnea  You will be referred to Dr Domenic Polite re chest pain  You will be  Referred to Dr Benjamine Mola, for chronic hoarseness  Please be careful about falling script for cane also provided

## 2013-06-18 DIAGNOSIS — G4700 Insomnia, unspecified: Secondary | ICD-10-CM | POA: Insufficient documentation

## 2013-06-18 DIAGNOSIS — F321 Major depressive disorder, single episode, moderate: Secondary | ICD-10-CM | POA: Insufficient documentation

## 2013-06-18 DIAGNOSIS — Z6841 Body Mass Index (BMI) 40.0 and over, adult: Secondary | ICD-10-CM

## 2013-06-18 DIAGNOSIS — E559 Vitamin D deficiency, unspecified: Secondary | ICD-10-CM | POA: Insufficient documentation

## 2013-06-18 DIAGNOSIS — M170 Bilateral primary osteoarthritis of knee: Secondary | ICD-10-CM | POA: Insufficient documentation

## 2013-06-18 LAB — CBC WITH DIFFERENTIAL/PLATELET
BASOS PCT: 1 % (ref 0–1)
Basophils Absolute: 0.1 10*3/uL (ref 0.0–0.1)
EOS ABS: 0.3 10*3/uL (ref 0.0–0.7)
EOS PCT: 6 % — AB (ref 0–5)
HCT: 40.7 % (ref 36.0–46.0)
HEMOGLOBIN: 13.5 g/dL (ref 12.0–15.0)
LYMPHS ABS: 1.9 10*3/uL (ref 0.7–4.0)
Lymphocytes Relative: 36 % (ref 12–46)
MCH: 26.2 pg (ref 26.0–34.0)
MCHC: 33.2 g/dL (ref 30.0–36.0)
MCV: 79 fL (ref 78.0–100.0)
MONOS PCT: 8 % (ref 3–12)
Monocytes Absolute: 0.4 10*3/uL (ref 0.1–1.0)
Neutro Abs: 2.6 10*3/uL (ref 1.7–7.7)
Neutrophils Relative %: 49 % (ref 43–77)
PLATELETS: 265 10*3/uL (ref 150–400)
RBC: 5.15 MIL/uL — AB (ref 3.87–5.11)
RDW: 14.9 % (ref 11.5–15.5)
WBC: 5.4 10*3/uL (ref 4.0–10.5)

## 2013-06-18 LAB — HEMOGLOBIN A1C
Hgb A1c MFr Bld: 5.4 % (ref <5.7)
Mean Plasma Glucose: 108 mg/dL (ref <117)

## 2013-06-18 LAB — VITAMIN D 25 HYDROXY (VIT D DEFICIENCY, FRACTURES): VIT D 25 HYDROXY: 16 ng/mL — AB (ref 30–89)

## 2013-06-18 MED ORDER — VITAMIN D (ERGOCALCIFEROL) 1.25 MG (50000 UNIT) PO CAPS: 50000.0000 [IU] | ORAL_CAPSULE | ORAL | Status: DC

## 2013-06-18 NOTE — Assessment & Plan Note (Signed)
Symptomatic, with h/o sleep apnea, though states she has never used CPAP, will refer for re eval has been to [facility] in the past

## 2013-06-18 NOTE — Assessment & Plan Note (Signed)
Difficulty attaining and maintaining sleep. Sleep hygiene reviewed and written information offered also. Prescription sent for  medication needed.

## 2013-06-18 NOTE — Assessment & Plan Note (Signed)
therapeutic lifestyle change discussed and encouraged as able, currently significantly limited by severe osteoarthritis, has right knee instability, needs ortho eval, may need replacement, wants to hold on this at this time, will pursue cardiology and pulmonary evaluations at this time

## 2013-06-18 NOTE — Progress Notes (Signed)
   Subjective:    Patient ID: Angela Chase, female    DOB: 1946-04-04, 68 y.o.   MRN: [id number]  HPI New patient evaluation for Angela Chase who I have followed in the past. She is here to re establish care. Her greatest concerns are increasing depresion, she has a feeling of worthlesness, lack of a purpose in life and a reason for living, she is not actively suicidal or homicidal, and states that this would never be the case. She also reports poor sleep, difficulty both initiating and maintaining sleep. Recently was seen at urgent care, diagnosed  With and treated for bronchitis. She c/o intermittent chest pain, she is not very active, and is unable to specifically identify any aggravating or relieving factors. She also reports that she has been diagnosed with sleep apnea in the past but was never fitted with a CPAP machine she is symptomatic and needs re evaluation and treatment for this. Both knees are painful and swollen as is her right shoulder , with marked limitation in mobility, she states that at times her right knee buckles , but she has never fallen. She has a h/o chronic painless hoarseness, and has had nodules removed in the past which were benign. Immunization, cancer screening and labs need updating Review of Systems See HPI Recently treated for bronchitis and has recovered Denies sinus pressure, nasal congestion, ear pain or sore throat. Denies chest congestion, productive cough or wheezing. Denies chest pains, palpitations and leg swelling Denies abdominal pain, nausea, vomiting,diarrhea or constipation.   Denies dysuria, frequency, hesitancy or incontinence. Denies headaches, seizures, numbness, or tingling.  Denies skin break down or rash.        Objective:   Physical Exam  BP 122/84  Pulse 66  Resp 18  Ht 5' 5.5" (1.664 m)  Wt 260 lb (117.935 kg)  BMI 42.59 kg/m2  SpO2 98% Patient alert and oriented and in no cardiopulmonary distress.  HEENT: No  facial asymmetry, EOMI, no sinus tenderness,  oropharynx pink and moist.  Neck supple no adenopathy.  Chest: Clear to auscultation bilaterally.  CVS: S1, S2 no murmurs, no S3.  ABD: Soft non tender. Bowel sounds normal.  Ext: No edema  Angela: Adequate ROM spine, decreased ROM right shoulder and knees right greater than left, with obvious deformity Skin: Intact, no ulcerations or rash noted.  Psych: Good eye contact, normal affect. Memory intact not anxious or depressed appearing.  CNS: CN 2-12 intact, power,  and sensation normal throughout.       Assessment & Plan:

## 2013-06-18 NOTE — Assessment & Plan Note (Signed)
History of being treated at [facility] in the past and has had removal of benign nodule, needs re eval

## 2013-06-18 NOTE — Assessment & Plan Note (Signed)
Needs to take weekly vit D will prescribe

## 2013-06-18 NOTE — Assessment & Plan Note (Signed)
Intermittent chest pain, at times radiates to  Shoulder, cardiology re evaluation, last seen in 2013 Recentrly seen in urgent care and had EKG so not reptd here. Lab data reviewed and generally is excellent except for mildly elevated LDL. No significant f/h CAD

## 2013-06-18 NOTE — Assessment & Plan Note (Signed)
Reports instability, esp in the right knee, no falls as of yet, script written for cane , will need ortho eval in the near future.

## 2013-06-22 ENCOUNTER — Ambulatory Visit: Payer: Medicare Other | Admitting: Cardiology

## 2013-06-25 ENCOUNTER — Ambulatory Visit (HOSPITAL_COMMUNITY): Payer: Medicare Other

## 2013-07-01 ENCOUNTER — Ambulatory Visit (HOSPITAL_COMMUNITY)
Admission: RE | Admit: 2013-07-01 | Discharge: 2013-07-01 | Disposition: A | Discharge status: Home / Self Care | Payer: Medicare Other | Source: Ambulatory Visit | Attending: Family Medicine | Admitting: Family Medicine

## 2013-07-01 DIAGNOSIS — Z1231 Encounter for screening mammogram for malignant neoplasm of breast: Secondary | ICD-10-CM

## 2013-07-05 ENCOUNTER — Other Ambulatory Visit: Payer: Self-pay | Admitting: Family Medicine

## 2013-07-05 DIAGNOSIS — R928 Other abnormal and inconclusive findings on diagnostic imaging of breast: Secondary | ICD-10-CM

## 2013-07-12 ENCOUNTER — Encounter: Payer: Self-pay | Admitting: Pulmonary Disease

## 2013-07-12 ENCOUNTER — Ambulatory Visit (INDEPENDENT_AMBULATORY_CARE_PROVIDER_SITE_OTHER): Payer: Medicare Other | Admitting: Pulmonary Disease

## 2013-07-12 VITALS — BP 110/76 | HR 72 | Temp 97.7°F | Ht 65.5 in | Wt 262.6 lb

## 2013-07-12 DIAGNOSIS — G4733 Obstructive sleep apnea (adult) (pediatric): Secondary | ICD-10-CM

## 2013-07-12 NOTE — Patient Instructions (Signed)
Will arrange for a sleep study at [facility].  Will call to make an apptm to review the results once available.  Work on weight loss

## 2013-07-12 NOTE — Progress Notes (Signed)
Subjective:    Patient ID: Angela Chase, female    DOB: 1945-11-29, 68 y.o.   MRN: [id number]  HPI The patient is a 68 year old female who I've been asked to see for possible obstructive sleep apnea. The patient tells me that she had a sleep study more than 10 years ago, and was told that she had sleep apnea and needed CPAP. However, she was never started on a CPAP device. Currently, the patient has been noted to have loud snoring, as well as an abnormal breathing pattern during sleep. She has frequent awakenings at night, and is not rested in the mornings upon arising. She does feel some sleep pressure during the day with inactivity, but "fights it". She will get sleepy in the evening watching television if it is something that is not interesting. She has no issues with sleepiness while driving. Of note, the patient's weight is up 140 pounds since 2000, and her Epworth score today is only one.   Sleep Questionnaire What time do you typically go to bed?( Between what hours) 8 8 at 1501 on 07/12/13 by Virl Cagey, CMA How long does it take you to fall asleep? 2-3hrs+ 2-3hrs+ at 1501 on 07/12/13 by Virl Cagey, CMA How many times during the night do you wake up? 3 3 at 1501 on 07/12/13 by Virl Cagey, CMA What time do you get out of bed to start your day? [id number] varies with sleep each night at 1501 on 07/12/13 by Virl Cagey, CMA Do you drive or operate heavy machinery in your occupation? No No at 1501 on 07/12/13 by Virl Cagey, CMA How much has your weight changed (up or down) over the past two years? (In pounds) 140 lb (63.504 kg)140 lb (63.504 kg) +/- at 1501 on 07/12/13 by Virl Cagey, CMA Have you ever had a sleep study before? Yes Yes at 1501 on 07/12/13 by Virl Cagey, CMA If yes, location of study? [place], [place], [place] at 1501 on 07/12/13 by Virl Cagey, CMA If yes, date of study? several years ago---10+yr several years  ago---10+yr at 1501 on 07/12/13 by Virl Cagey, CMA Do you currently use CPAP? No No at 1501 on 07/12/13 by Virl Cagey, CMA Do you wear oxygen at any time? No No at 1501 on 07/12/13 by Virl Cagey, CMA O2 Flow Rate (L/min)    Review of Systems  Constitutional: Positive for unexpected weight change. Negative for fever.  HENT: Positive for dental problem and sore throat. Negative for congestion, ear pain, nosebleeds, postnasal drip, rhinorrhea, sinus pressure, sneezing and trouble swallowing.   Eyes: Negative for redness and itching.  Respiratory: Positive for shortness of breath. Negative for cough, chest tightness and wheezing.   Cardiovascular: Negative for palpitations and leg swelling.  Gastrointestinal: Negative for nausea and vomiting.  Genitourinary: Negative for dysuria.  Musculoskeletal: Positive for joint swelling.  Skin: Negative for rash.  Neurological: Positive for headaches.  Hematological: Does not bruise/bleed easily.  Psychiatric/Behavioral: Positive for dysphoric mood. The patient is not nervous/anxious.        Objective:   Physical Exam Constitutional: obese female, no acute distress  HENT:  Nares patent without discharge, large turbinates with edema.  Oropharynx without exudate, palate and uvula are mildly elongated.   Eyes:  Perrla, eomi, no scleral icterus  Neck:  No JVD, no TMG  Cardiovascular:  Normal rate, regular rhythm, no rubs or gallops.  No murmurs  Intact distal pulses  Pulmonary :  Normal breath sounds, no stridor or respiratory distress   No rales, rhonchi, or wheezing  Abdominal:  Soft, nondistended, bowel sounds present.  No tenderness noted.   Musculoskeletal:  No lower extremity edema noted.  Lymph Nodes:  No cervical lymphadenopathy noted  Skin:  No cyanosis noted  Neurologic:  Alert, appropriate, moves all 4 extremities without obvious deficit.         Assessment & Plan:

## 2013-07-12 NOTE — Assessment & Plan Note (Signed)
The patient had a sleep study greater than 10 years ago, and was told that she had sleep apnea. For some reason, she was never started on CPAP, and currently she has a history that is very suggestive of sleep disordered breathing.  She tells me that she has gained 140 pounds over the last 14 years, and that she had 2 family members with sleep apnea. Her sleep study from long ago has been purged, and we will therefore need to do a followup study to verify the diagnosis before starting treatment. I have had a long discussion with her about sleep apnea, including its impact to her cardiovascular health and quality of life.

## 2013-07-14 ENCOUNTER — Encounter: Payer: Self-pay | Admitting: Cardiology

## 2013-07-14 ENCOUNTER — Ambulatory Visit (INDEPENDENT_AMBULATORY_CARE_PROVIDER_SITE_OTHER): Payer: Medicare Other | Admitting: Cardiology

## 2013-07-14 VITALS — BP 124/82 | HR 66 | Ht 66.0 in | Wt 258.0 lb

## 2013-07-14 DIAGNOSIS — E785 Hyperlipidemia, unspecified: Secondary | ICD-10-CM

## 2013-07-14 DIAGNOSIS — G4733 Obstructive sleep apnea (adult) (pediatric): Secondary | ICD-10-CM

## 2013-07-14 DIAGNOSIS — R0600 Dyspnea, unspecified: Secondary | ICD-10-CM

## 2013-07-14 DIAGNOSIS — R0989 Other specified symptoms and signs involving the circulatory and respiratory systems: Secondary | ICD-10-CM

## 2013-07-14 DIAGNOSIS — R072 Precordial pain: Secondary | ICD-10-CM

## 2013-07-14 DIAGNOSIS — R0609 Other forms of dyspnea: Secondary | ICD-10-CM

## 2013-07-14 NOTE — Assessment & Plan Note (Signed)
Nonexertional, ischemic workup within the last year and a half was negative, LVEF 61% at that time. ECG is normal today. Likely do not need to pursue followup ischemic testing at this point, however echocardiogram will be obtained given concurrent shortness of breath and possibility of sleep apnea. Mainly need to exclude any associated cardiomyopathy. We will inform her of the results.

## 2013-07-14 NOTE — Patient Instructions (Signed)
Your physician wants you to follow-up in: [place] will receive a reminder letter in the mail two months in advance. If you don't receive a letter, please call our office to schedule the follow-up appointment.   Your physician has requested that you have an echocardiogram. Echocardiography is a painless test that uses sound waves to create images of your heart. It provides your doctor with information about the size and shape of your heart and how well your heart's chambers and valves are working. This procedure takes approximately one hour. There are no restrictions for this procedure.

## 2013-07-14 NOTE — Assessment & Plan Note (Signed)
Ongoing workup per Dr. Gwenette Greet.

## 2013-07-14 NOTE — Progress Notes (Signed)
Clinical Summary Angela Chase is a 68 y.o.female referred back to the office by Dr. Moshe Cipro. She was last seen in May 2013 for evaluation of atypical chest pain. Lexiscan Myoview done at that time was negative for ischemia with LVEF 61%. Reports having a bout of bronchitis a few months ago, has had continued problems with shortness of breath, sometimes waking up at night time. She also has occasional  Atypical, sharp chest pains that are nonexertional with these episodes.  Recent visit with Dr. Gwenette Greet noted for evaluation of possible OSA, sleep study pending.  ECG today is normal. She reports NYHA class II dyspnea, functionally limited by significant right knee pain. No orthopnea or PND based on description.   Allergies  Allergen Reactions  . Cephalexin Hives  . Omeprazole Hives  . Rofecoxib Rash    Current Outpatient Prescriptions  Medication Sig Dispense Refill  . FLUoxetine (PROZAC) 20 MG capsule Take 1 capsule (20 mg total) by mouth daily.  30 capsule  3  . montelukast (SINGULAIR) 10 MG tablet Take 1 tablet (10 mg total) by mouth at bedtime.  30 tablet  3  . temazepam (RESTORIL) 15 MG capsule Take 1 capsule (15 mg total) by mouth at bedtime as needed for sleep.  30 capsule  3  . Vitamin D, Ergocalciferol, (DRISDOL) 50000 UNITS CAPS capsule Take 1 capsule (50,000 Units total) by mouth every 7 (seven) days.  4 capsule  5   No current facility-administered medications for this visit.    Past Medical History  Diagnosis Date  . Mixed hyperlipidemia   . Obstructive sleep apnea   . Chronic bronchitis   . Osteoarthritis   . Asthma 2010  . GERD (gastroesophageal reflux disease) 1995    Past Surgical History  Procedure Laterality Date  . Rotator cuff repair Right   . Cholecystectomy      Family History  Problem Relation Age of Onset  . Cancer Father   . Cancer Mother   . Heart attack Mother   . Cancer Brother     Social History Angela Chase reports that she has never  smoked. She has never used smokeless tobacco. Angela Chase reports that she does not drink alcohol.  Review of Systems No palpitations, dizziness, syncope. No fevers or chills. Stable appetite. Otherwise as outlined above.  Physical Examination Filed Vitals:   07/14/13 0759  BP: 124/82  Pulse: 66   Filed Weights   07/14/13 0759  Weight: 258 lb (117.028 kg)    Obese woman, appears comfortable at rest.  HEENT: Conjunctiva and lids normal, oropharynx clear.  Neck: Supple, no elevated JVP or carotid bruits, no thyromegaly.  Lungs: Decreased breath sounds but no wheezes, nonlabored breathing at rest.  Cardiac: Regular rate and rhythm, no S3 or significant systolic murmur, no pericardial rub.  Abdomen: Soft, nontender, bowel sounds present, no guarding or rebound.  Extremities: No pitting edema, distal pulses 2+.  Skin: Warm and dry.  Musculoskeletal: No kyphosis.  Neuropsychiatric: Alert and oriented x3, affect grossly appropriate.   Problem List and Plan   Atypical chest pain Nonexertional, ischemic workup within the last year and a half was negative, LVEF 61% at that time. ECG is normal today. Likely do not need to pursue followup ischemic testing at this point, however echocardiogram will be obtained given concurrent shortness of breath and possibility of sleep apnea. Mainly need to exclude any associated cardiomyopathy. We will inform her of the results.  OSA (obstructive sleep apnea) Ongoing workup per  Dr. Gwenette Greet.  HYPERLIPIDEMIA Followed by Dr. Moshe Cipro. Recent LDL 117.    Satira Sark, M.D., F.A.C.C.

## 2013-07-14 NOTE — Assessment & Plan Note (Signed)
Followed by Dr. Moshe Cipro. Recent LDL 117.

## 2013-07-15 ENCOUNTER — Ambulatory Visit (INDEPENDENT_AMBULATORY_CARE_PROVIDER_SITE_OTHER): Payer: Medicare Other | Admitting: Otolaryngology

## 2013-07-15 DIAGNOSIS — J343 Hypertrophy of nasal turbinates: Secondary | ICD-10-CM

## 2013-07-15 DIAGNOSIS — T169XXA Foreign body in ear, unspecified ear, initial encounter: Secondary | ICD-10-CM

## 2013-07-15 DIAGNOSIS — J342 Deviated nasal septum: Secondary | ICD-10-CM

## 2013-07-16 ENCOUNTER — Other Ambulatory Visit (INDEPENDENT_AMBULATORY_CARE_PROVIDER_SITE_OTHER): Payer: Self-pay | Admitting: Otolaryngology

## 2013-07-16 DIAGNOSIS — J329 Chronic sinusitis, unspecified: Secondary | ICD-10-CM

## 2013-07-19 ENCOUNTER — Ambulatory Visit: Payer: Medicare Other | Attending: Pulmonary Disease | Admitting: Sleep Medicine

## 2013-07-19 DIAGNOSIS — G4733 Obstructive sleep apnea (adult) (pediatric): Secondary | ICD-10-CM | POA: Insufficient documentation

## 2013-07-21 ENCOUNTER — Ambulatory Visit (HOSPITAL_COMMUNITY)
Admission: RE | Admit: 2013-07-21 | Discharge: 2013-07-21 | Disposition: A | Discharge status: Home / Self Care | Payer: Medicare Other | Source: Ambulatory Visit | Attending: Otolaryngology | Admitting: Otolaryngology

## 2013-07-21 DIAGNOSIS — R269 Unspecified abnormalities of gait and mobility: Secondary | ICD-10-CM | POA: Insufficient documentation

## 2013-07-21 DIAGNOSIS — R42 Dizziness and giddiness: Secondary | ICD-10-CM | POA: Insufficient documentation

## 2013-07-21 DIAGNOSIS — J329 Chronic sinusitis, unspecified: Secondary | ICD-10-CM

## 2013-07-22 ENCOUNTER — Ambulatory Visit (HOSPITAL_COMMUNITY): Payer: Medicare Other | Attending: Cardiology

## 2013-07-23 ENCOUNTER — Telehealth: Payer: Self-pay | Admitting: Pulmonary Disease

## 2013-07-23 DIAGNOSIS — G4730 Sleep apnea, unspecified: Secondary | ICD-10-CM

## 2013-07-23 DIAGNOSIS — G4710 Hypersomnia, unspecified: Secondary | ICD-10-CM

## 2013-07-23 DIAGNOSIS — G4733 Obstructive sleep apnea (adult) (pediatric): Secondary | ICD-10-CM

## 2013-07-23 NOTE — Sleep Study (Signed)
   NAME: Angela Chase DATE OF BIRTH:  Dec 04, 1945 MEDICAL RECORD NUMBER [id number]  LOCATION: [facility]  PHYSICIAN: Kathee Delton  DATE OF STUDY: 07/19/2013  SLEEP STUDY TYPE: Nocturnal Polysomnogram               REFERRING PHYSICIAN: Lolly Glaus, Armando Reichert, MD  INDICATION FOR STUDY: Hypersomnia with sleep apnea  EPWORTH SLEEPINESS SCORE:  3 HEIGHT:    WEIGHT:      There is no weight on file to calculate BMI.  NECK SIZE:   in.  MEDICATIONS: Reviewed in the sleep chart  SLEEP ARCHITECTURE: The patient had a total sleep time of 267 minutes with very little slow-wave sleep and no REM achieved.  RESPIRATORY DATA: The patient was found to have 39 apneas and 165 obstructive hypopneas, giving her an AHI of 46 events per hour. The events occurred in all body positions, and there was very loud snoring noted throughout.  OXYGEN DATA: There was oxygen desaturation as low as 84% with the patient's obstructive events  CARDIAC DATA: Rare PVC noted  MOVEMENT/PARASOMNIA: The patient was found to have mild to moderate limb movements, but very little sleep disruption.  IMPRESSION/ RECOMMENDATION:    1)  severe obstructive sleep apnea/hypopnea syndrome, with an AHI of 46 events per hour and oxygen desaturation as low as 84%. Treatment for this degree of sleep apnea should focus on a trial of CPAP as well as aggressive weight loss.  2)  rare PVC noted, but no clinically significant arrhythmias were seen.     Kathee Delton Diplomate, American Board of Sleep Medicine  ELECTRONICALLY SIGNED ON:  07/23/2013, 9:00 AM [place] PH: [phone]   FX: [phone] [place]

## 2013-07-23 NOTE — Telephone Encounter (Signed)
Pt needs ov to review sleep study 

## 2013-07-23 NOTE — Telephone Encounter (Signed)
LMOM X1 

## 2013-07-28 ENCOUNTER — Ambulatory Visit (HOSPITAL_COMMUNITY)
Admission: RE | Admit: 2013-07-28 | Discharge: 2013-07-28 | Disposition: A | Discharge status: Home / Self Care | Payer: Medicare Other | Source: Ambulatory Visit | Attending: Family Medicine | Admitting: Family Medicine

## 2013-07-28 ENCOUNTER — Other Ambulatory Visit: Payer: Self-pay | Admitting: Family Medicine

## 2013-07-28 ENCOUNTER — Encounter (HOSPITAL_COMMUNITY): Payer: Medicare Other

## 2013-07-28 DIAGNOSIS — R928 Other abnormal and inconclusive findings on diagnostic imaging of breast: Secondary | ICD-10-CM | POA: Insufficient documentation

## 2013-07-28 DIAGNOSIS — R921 Mammographic calcification found on diagnostic imaging of breast: Secondary | ICD-10-CM

## 2013-07-28 NOTE — Telephone Encounter (Signed)
Unable to leave vmail--mailbox full

## 2013-07-29 ENCOUNTER — Ambulatory Visit (INDEPENDENT_AMBULATORY_CARE_PROVIDER_SITE_OTHER): Payer: Medicare Other | Admitting: Otolaryngology

## 2013-07-29 ENCOUNTER — Ambulatory Visit (INDEPENDENT_AMBULATORY_CARE_PROVIDER_SITE_OTHER): Payer: Medicare Other | Admitting: Family Medicine

## 2013-07-29 ENCOUNTER — Encounter: Payer: Self-pay | Admitting: Family Medicine

## 2013-07-29 VITALS — BP 124/78 | HR 97 | Resp 16 | Ht 65.5 in | Wt 253.0 lb

## 2013-07-29 DIAGNOSIS — Z13820 Encounter for screening for osteoporosis: Secondary | ICD-10-CM

## 2013-07-29 DIAGNOSIS — R42 Dizziness and giddiness: Secondary | ICD-10-CM | POA: Insufficient documentation

## 2013-07-29 DIAGNOSIS — E559 Vitamin D deficiency, unspecified: Secondary | ICD-10-CM

## 2013-07-29 DIAGNOSIS — G4700 Insomnia, unspecified: Secondary | ICD-10-CM

## 2013-07-29 DIAGNOSIS — F3289 Other specified depressive episodes: Secondary | ICD-10-CM

## 2013-07-29 DIAGNOSIS — J45901 Unspecified asthma with (acute) exacerbation: Secondary | ICD-10-CM | POA: Insufficient documentation

## 2013-07-29 DIAGNOSIS — G4733 Obstructive sleep apnea (adult) (pediatric): Secondary | ICD-10-CM

## 2013-07-29 DIAGNOSIS — F32A Depression, unspecified: Secondary | ICD-10-CM

## 2013-07-29 DIAGNOSIS — J0100 Acute maxillary sinusitis, unspecified: Secondary | ICD-10-CM

## 2013-07-29 DIAGNOSIS — F329 Major depressive disorder, single episode, unspecified: Secondary | ICD-10-CM

## 2013-07-29 MED ORDER — ALBUTEROL SULFATE HFA 108 (90 BASE) MCG/ACT IN AERS: 2.0000 | INHALATION_SPRAY | Freq: Four times a day (QID) | RESPIRATORY_TRACT | Status: DC | PRN

## 2013-07-29 MED ORDER — BUDESONIDE-FORMOTEROL FUMARATE 160-4.5 MCG/ACT IN AERO: 2.0000 | INHALATION_SPRAY | Freq: Two times a day (BID) | RESPIRATORY_TRACT | Status: DC

## 2013-07-29 MED ORDER — PREDNISONE (PAK) 5 MG PO TABS: 5.0000 mg | ORAL_TABLET | ORAL | Status: DC

## 2013-07-29 MED ORDER — MONTELUKAST SODIUM 10 MG PO TABS: 10.0000 mg | ORAL_TABLET | Freq: Every day | ORAL | Status: DC

## 2013-07-29 MED ORDER — PROMETHAZINE-DM 6.25-15 MG/5ML PO SYRP: ORAL_SOLUTION | ORAL | Status: DC

## 2013-07-29 MED ORDER — DOXYCYCLINE HYCLATE 100 MG PO TABS: 100.0000 mg | ORAL_TABLET | Freq: Two times a day (BID) | ORAL | Status: DC

## 2013-07-29 MED ORDER — MECLIZINE HCL 12.5 MG PO TABS: 12.5000 mg | ORAL_TABLET | Freq: Three times a day (TID) | ORAL | Status: DC | PRN

## 2013-07-29 NOTE — Patient Instructions (Addendum)
cPE in 6 weeks.  You are treated today for acute maxillary sinusitis and asthma flare. Neb treatment and depo medrol in office  Prednisone, doxycycline , phenergan DM, symbicort, albuterol, singulair, have been prescribed  I am referring you to get lung function testing and re eval of your asthma with Dr Gwenette Greet  You need to take  Vit D weekly and you are also referred for bone density test  Will addres lower extremity symptoms at future visit

## 2013-07-31 NOTE — Assessment & Plan Note (Signed)
Pt to start mainatinance management , depo medrol , neb treatment in office also steroid burst again prescribed, poor air entry on exam and pt states "I cannot breathe" Refer to pulmonary for PFT and managemnt of uncontrolled dasthma

## 2013-07-31 NOTE — Assessment & Plan Note (Signed)
recnt testing shows severe disease and she is to start definitive treatment pulmonary Doc treating

## 2013-07-31 NOTE — Assessment & Plan Note (Signed)
Improving on medication, unable to reassess objectively at this visit, due to severe respiratory distress

## 2013-07-31 NOTE — Assessment & Plan Note (Signed)
Mild vertigo associated with sinusitis, meclizine prescribed

## 2013-07-31 NOTE — Assessment & Plan Note (Signed)
10 antibiotic course prescribed

## 2013-07-31 NOTE — Assessment & Plan Note (Signed)
Needs at least 6 months of treatment

## 2013-07-31 NOTE — Progress Notes (Signed)
   Subjective:    Patient ID: Angela Chase, female    DOB: 10-24-1945, 68 y.o.   MRN: [id number]  HPI The PT is here for follow up and re-evaluation of chronic medical conditions, medication management and review of any available recent lab and radiology data.  Preventive health is updated, specifically  Cancer screening and Immunization.   Questions or concerns regarding consultations or procedures which the PT has had in the interim are  Addressed.Has definitive dx of sleep apnea, cardiology eval negative for CAD, calcifications on left breat for biopsy. The PT denies any adverse reactions to current medications since the last visit.  1 week h/o increased sinus pressure with yellow sputum, dizziness, excessive cough , wheezing and dyspnea Sleep disturbed, states "I can't breathe"  C/o lower extremity numbness and tingling x 1 month, no loss in power, no recent trauma , no falls     Review of Systems See HPI Denies recent fever or chills. Denies chest pains, palpitations and leg swelling Denies abdominal pain, nausea, vomiting,diarrhea or constipation.   Denies dysuria, frequency, hesitancy or incontinence. Chronic bilateral knee pain Denies headaches, seizures,  Improvement in depression, not suicidal or homicidal Denies skin break down or rash.        Objective:   Physical Exam  BP 124/78  Pulse 97  Resp 16  Ht 5' 5.5" (1.664 m)  Wt 253 lb (114.76 kg)  BMI 41.45 kg/m2  SpO2 96% Patient alert and oriented and in mild  cardiopulmonary distress.  HEENT: No facial asymmetry, EOMI, maxillary sinus tenderness,  oropharynx pink and moist.  Neck supple no adenopathy.  Chest: decreased air entry bilateral wheezes  CVS: S1, S2 no murmurs, no S3.  ABD: Soft non tender. Bowel sounds normal.  Ext: No edema  MS: Adequate though reduced  ROM spine, , hips and knees.  Skin: Intact, no ulcerations or rash noted.  Psych: Good eye contact, normal affect. Memory intact  not anxious or depressed appearing.  CNS: CN 2-12 intact, power,  normal throughout.       Assessment & Plan:  Sinusitis, acute, maxillary 10 antibiotic course prescribed  Unspecified asthma, with exacerbation Pt to start mainatinance management , depo medrol , neb treatment in office also steroid burst again prescribed, poor air entry on exam and pt states "I cannot breathe" Refer to pulmonary for PFT and managemnt of uncontrolled dasthma  Depression Improving on medication, unable to reassess objectively at this visit, due to severe respiratory distress  Insomnia Worsened in recent times due to excessive cough from uncontrolled asthma, cough suppressant prescribed for short term use  OSA (obstructive sleep apnea) recnt testing shows severe disease and she is to start definitive treatment pulmonary Doc treating  Unspecified vitamin D deficiency Needs at least 6 months of treatment  Vertigo Mild vertigo associated with sinusitis, meclizine prescribed

## 2013-07-31 NOTE — Assessment & Plan Note (Signed)
Worsened in recent times due to excessive cough from uncontrolled asthma, cough suppressant prescribed for short term use

## 2013-08-03 MED ORDER — IPRATROPIUM BROMIDE 0.02 % IN SOLN
0.5000 mg | Freq: Once | RESPIRATORY_TRACT | Status: AC
  Administered 2013-08-03: 0.5 mg via RESPIRATORY_TRACT

## 2013-08-03 MED ORDER — METHYLPREDNISOLONE ACETATE 80 MG/ML IJ SUSP
80.0000 mg | Freq: Once | INTRAMUSCULAR | Status: AC
  Administered 2013-08-03: 80 mg via INTRAMUSCULAR

## 2013-08-03 MED ORDER — ALBUTEROL SULFATE (2.5 MG/3ML) 0.083% IN NEBU
2.5000 mg | INHALATION_SOLUTION | Freq: Once | RESPIRATORY_TRACT | Status: AC
  Administered 2013-08-03: 2.5 mg via RESPIRATORY_TRACT

## 2013-08-03 NOTE — Telephone Encounter (Addendum)
Unable to leave vmail--mailbox full Letter mailed to [address]  [place] [address].

## 2013-08-03 NOTE — Addendum Note (Signed)
Addended by: Denman George B on: 08/03/2013 09:37 AM   Modules accepted: Orders

## 2013-08-10 ENCOUNTER — Telehealth: Payer: Self-pay | Admitting: Pulmonary Disease

## 2013-08-10 NOTE — Telephone Encounter (Signed)
lmomtcb x1 for pt 

## 2013-08-10 NOTE — Telephone Encounter (Signed)
Ok with me as long as she is ok with waiting that long.

## 2013-08-10 NOTE — Telephone Encounter (Signed)
Kathee Delton, MD at 07/23/2013 9:04 AM     Status: Signed        Pt needs ov to review sleep study  --  Called spoke w/ pt. . Pt is scheduled for OV w/ [facility] 08/31/13 for consult asthma. Wants to know if results can be discussed at that [facility]. Please advise [place] thanks

## 2013-08-11 ENCOUNTER — Other Ambulatory Visit (HOSPITAL_COMMUNITY): Payer: Medicare Other

## 2013-08-11 NOTE — Telephone Encounter (Signed)
Spoke with pt. She will call back to schedule a sooner appt when she gets home. Nothing further needed

## 2013-08-13 ENCOUNTER — Ambulatory Visit
Admission: RE | Admit: 2013-08-13 | Discharge: 2013-08-13 | Disposition: A | Discharge status: Home / Self Care | Payer: Medicare Other | Source: Ambulatory Visit | Attending: Family Medicine | Admitting: Family Medicine

## 2013-08-13 DIAGNOSIS — R921 Mammographic calcification found on diagnostic imaging of breast: Secondary | ICD-10-CM

## 2013-08-16 ENCOUNTER — Ambulatory Visit (INDEPENDENT_AMBULATORY_CARE_PROVIDER_SITE_OTHER): Payer: Medicare Other | Admitting: Pulmonary Disease

## 2013-08-16 ENCOUNTER — Encounter: Payer: Self-pay | Admitting: Pulmonary Disease

## 2013-08-16 VITALS — BP 110/70 | HR 96 | Temp 98.5°F | Ht 65.5 in | Wt 254.6 lb

## 2013-08-16 DIAGNOSIS — G4733 Obstructive sleep apnea (adult) (pediatric): Secondary | ICD-10-CM

## 2013-08-16 NOTE — Progress Notes (Signed)
   Subjective:    Patient ID: Angela Chase, female    DOB: 1945-05-03, 68 y.o.   MRN: [id number]  HPI Patient comes in today for followup of her recent sleep study. She was found to have severe OSA, with an AHI of 46 events per hour. I have reviewed the study with her in detail, and answered all of her questions.   Review of Systems  Constitutional: Positive for fever. Negative for unexpected weight change.  HENT: Positive for postnasal drip and sneezing. Negative for congestion, dental problem, ear pain, nosebleeds, rhinorrhea, sinus pressure, sore throat and trouble swallowing.        Allergies  Eyes: Negative for redness and itching.  Respiratory: Positive for cough. Negative for chest tightness, shortness of breath and wheezing.        Yellow/white mucus  Cardiovascular: Negative for palpitations and leg swelling.  Gastrointestinal: Negative for nausea and vomiting.  Genitourinary: Negative for dysuria.  Musculoskeletal: Negative for joint swelling.  Skin: Negative for rash.  Neurological: Negative for headaches.  Hematological: Does not bruise/bleed easily.  Psychiatric/Behavioral: Negative for dysphoric mood. The patient is not nervous/anxious.        Objective:   Physical Exam Obese female in no acute distress Nose without purulence or discharge noted Neck without lymphadenopathy or thyromegaly Lower extremities with mild edema, no cyanosis Alert and oriented, moves all 4 extremities.       Assessment & Plan:

## 2013-08-16 NOTE — Patient Instructions (Signed)
Will start you on cpap at a moderate pressure level.  Please call if you are having tolerance issues  Work on weight loss followup with me in 8 weeks.

## 2013-08-16 NOTE — Assessment & Plan Note (Signed)
The patient has severe obstructive sleep apnea, and will need a trial of CPAP while she is working on weight loss. She is agreeable to this approach. I will set the patient up on cpap at a moderate pressure level to allow for desensitization, and will troubleshoot the device over the next 4-6weeks if needed.  The pt is to call me if having issues with tolerance.  Will then optimize the pressure once patient is able to wear cpap on a consistent basis.

## 2013-08-18 ENCOUNTER — Telehealth: Payer: Self-pay | Admitting: Pulmonary Disease

## 2013-08-18 NOTE — Telephone Encounter (Signed)
Called and spoke with Iris from [facility] and they did get the order that was sent over on 4/20.  i called the pt back and she is aware that [facility] did get the orders and they will be calling her.  Nothing further is needed.

## 2013-08-19 ENCOUNTER — Telehealth: Payer: Self-pay

## 2013-08-19 ENCOUNTER — Ambulatory Visit (INDEPENDENT_AMBULATORY_CARE_PROVIDER_SITE_OTHER): Payer: Medicare Other | Admitting: Family Medicine

## 2013-08-19 ENCOUNTER — Encounter: Payer: Self-pay | Admitting: Family Medicine

## 2013-08-19 VITALS — BP 134/76 | HR 100 | Resp 20 | Ht 65.5 in | Wt 255.0 lb

## 2013-08-19 DIAGNOSIS — D242 Benign neoplasm of left breast: Secondary | ICD-10-CM

## 2013-08-19 DIAGNOSIS — F32A Depression, unspecified: Secondary | ICD-10-CM

## 2013-08-19 DIAGNOSIS — G4733 Obstructive sleep apnea (adult) (pediatric): Secondary | ICD-10-CM

## 2013-08-19 DIAGNOSIS — Z6841 Body Mass Index (BMI) 40.0 and over, adult: Secondary | ICD-10-CM

## 2013-08-19 DIAGNOSIS — D249 Benign neoplasm of unspecified breast: Secondary | ICD-10-CM

## 2013-08-19 DIAGNOSIS — F3289 Other specified depressive episodes: Secondary | ICD-10-CM

## 2013-08-19 DIAGNOSIS — F329 Major depressive disorder, single episode, unspecified: Secondary | ICD-10-CM

## 2013-08-19 MED ORDER — PHENTERMINE HCL 37.5 MG PO TABS: ORAL_TABLET | ORAL | Status: DC

## 2013-08-19 NOTE — Patient Instructions (Addendum)
Annual physical exam in 2 month, call if you need me before  You are referred to breast surgeon in [place], for further evaluation and management, please cancel your appointment with Dr . Arnoldo Morale  Please follow through on CPAP supplies with Dr Gerald Leitz  New to help with weight loss is phentermine HALF daily , also work on changing your diet  Continue fluoxetine as before, you have significantly improved

## 2013-08-19 NOTE — Telephone Encounter (Signed)
No need to be on lasix, reduce sodium intake , elevate legs increase fresh vegetable and fruit intake, heart and kidney function are both entirely normal which is GREAT, pls let her know

## 2013-08-22 DIAGNOSIS — D242 Benign neoplasm of left breast: Secondary | ICD-10-CM | POA: Insufficient documentation

## 2013-08-22 NOTE — Assessment & Plan Note (Signed)
Marked improvement on medication, denies symptoms of depression

## 2013-08-22 NOTE — Assessment & Plan Note (Signed)
Referred to surgery for further management.

## 2013-08-22 NOTE — Progress Notes (Signed)
   Subjective:    Patient ID: Angela Chase, female    DOB: 02-02-1946, 68 y.o.   MRN: [id number]  HPI Pt in mainly to discuss recent biopsy report of her breast, and further f/u needed. States she wanted to come in to see me about this as she just received a call from the radiologist who had referred her to local surgeon without asking her prefrnce and she just "did not feel comfortable" with how referral and info was handled. Has responded very well to fluoxetine, no more symptoms of depression. Requests phentermine , which she has used in the past , to help with weight loss. Knee pain remains a real issue Still awaiting her CPAP which she desperately needs based on severe sleep apnea   Review of Systems See HPI Denies recent fever or chills. Denies sinus pressure, nasal congestion, ear pain or sore throat. Denies chest congestion, productive cough or wheezing. Denies chest pains, palpitations and leg swelling Denies abdominal pain, nausea, vomiting,diarrhea or constipation.   Denies dysuria, frequency, hesitancy or incontinence.  Denies headaches, seizures, numbness, or tingling. Denies depression, anxiety or insomnia. Denies skin break down or rash.        Objective:   Physical Exam BP 134/76  Pulse 100  Resp 20  Ht 5' 5.5" (1.664 m)  Wt 255 lb (115.667 kg)  BMI 41.77 kg/m2  SpO2 93% Patient alert and oriented and in no cardiopulmonary distress.  HEENT: No facial asymmetry, EOMI, no sinus tenderness,  oropharynx pink and moist.  Neck supple no adenopathy.  Chest: Clear to auscultation bilaterally.  CVS: S1, S2 no murmurs, no S3.  ABD: Soft non tender. Bowel sounds normal.  Ext: No edema  MS: Adequate ROM spine, shoulders, hips and decreased in  knees.  Skin: Intact, no ulcerations or rash noted.  Psych: Good eye contact, normal affect. Memory intact not anxious or depressed appearing.  CNS: CN 2-12 intact, power, tone and sensation normal  throughout.        Assessment & Plan:  Papilloma of left breast Referred to surgery for further management.  Depression Marked improvement on medication, denies symptoms of depression  Morbid obesity with BMI of 40.0-44.9, adult Has used phentermine in the past. Recent cardiology eval negative. Has severe arthritis of knees, will start phentermine to assist with weight loss  OSA (obstructive sleep apnea) Being managed by pulmonary, still awaiting CPAP machine, has severe disease

## 2013-08-22 NOTE — Assessment & Plan Note (Signed)
Has used phentermine in the past. Recent cardiology eval negative. Has severe arthritis of knees, will start phentermine to assist with weight loss

## 2013-08-22 NOTE — Assessment & Plan Note (Signed)
Being managed by pulmonary, still awaiting CPAP machine, has severe disease

## 2013-08-24 NOTE — Telephone Encounter (Signed)
Called and left message for patient to return call.  

## 2013-08-26 ENCOUNTER — Ambulatory Visit (INDEPENDENT_AMBULATORY_CARE_PROVIDER_SITE_OTHER): Payer: Medicare Other | Admitting: Otolaryngology

## 2013-08-26 DIAGNOSIS — J320 Chronic maxillary sinusitis: Secondary | ICD-10-CM

## 2013-08-26 DIAGNOSIS — J310 Chronic rhinitis: Secondary | ICD-10-CM

## 2013-08-26 DIAGNOSIS — J342 Deviated nasal septum: Secondary | ICD-10-CM

## 2013-08-26 NOTE — Telephone Encounter (Signed)
Patient aware.

## 2013-08-31 ENCOUNTER — Encounter: Payer: Self-pay | Admitting: Pulmonary Disease

## 2013-08-31 ENCOUNTER — Ambulatory Visit (INDEPENDENT_AMBULATORY_CARE_PROVIDER_SITE_OTHER)
Admission: RE | Admit: 2013-08-31 | Discharge: 2013-08-31 | Disposition: A | Discharge status: Home / Self Care | Payer: Medicare Other | Source: Ambulatory Visit | Attending: Pulmonary Disease | Admitting: Pulmonary Disease

## 2013-08-31 ENCOUNTER — Ambulatory Visit (INDEPENDENT_AMBULATORY_CARE_PROVIDER_SITE_OTHER): Payer: BC Managed Care – PPO | Admitting: Pulmonary Disease

## 2013-08-31 ENCOUNTER — Encounter (INDEPENDENT_AMBULATORY_CARE_PROVIDER_SITE_OTHER): Payer: Medicare Other | Admitting: General Surgery

## 2013-08-31 VITALS — BP 112/70 | HR 73 | Temp 97.8°F | Ht 65.5 in | Wt 251.0 lb

## 2013-08-31 DIAGNOSIS — R053 Chronic cough: Secondary | ICD-10-CM

## 2013-08-31 DIAGNOSIS — J45901 Unspecified asthma with (acute) exacerbation: Secondary | ICD-10-CM

## 2013-08-31 DIAGNOSIS — R059 Cough, unspecified: Secondary | ICD-10-CM

## 2013-08-31 DIAGNOSIS — R05 Cough: Secondary | ICD-10-CM

## 2013-08-31 DIAGNOSIS — R490 Dysphonia: Secondary | ICD-10-CM

## 2013-08-31 DIAGNOSIS — G4733 Obstructive sleep apnea (adult) (pediatric): Secondary | ICD-10-CM

## 2013-08-31 MED ORDER — OMEPRAZOLE 40 MG PO CPDR: 40.0000 mg | DELAYED_RELEASE_CAPSULE | Freq: Every day | ORAL | Status: DC

## 2013-08-31 NOTE — Patient Instructions (Addendum)
Will find out why you have yet to receive your cpap machine Will check chest xray today, and call you with results. Start on omeprazole 40mg , one in am and pm for the next 4 weeks.  Stay on symbicort and singulair for now, and hopefully will be able to sort out whether you really need these or not.  Will send note to Dr. Benjamine Mola to make sure he has done an upper airway evaluation of your vocal cords.   followup with me again in 4 weeks.

## 2013-08-31 NOTE — Progress Notes (Signed)
   Subjective:    Patient ID: Angela Chase, female    DOB: 05/18/1945, 68 y.o.   MRN: [id number]  HPI The patient is a 68 year old female who I've been asked to see for management of asthma. She tells me that she was diagnosed with asthma a few years back, but is not sure whether she ever had pulmonary function studies. She currently is on Symbicort and Singulair daily, but despite this continues to have symptoms.  These are primarily ongoing coughing with a cyclical component, and as well as chronic dyspnea on exertion with intermittent worsening. Her cough is primarily dry and barking in nature, but she does produce clear to white mucus at times. She has chronic hoarseness that dates back 30 years, and has been seen by a speech therapist at [facility] for what sounds like vocal cord dysfunction. She is followed by otolaryngology here in [place] for chronic sinusitis, with a recent CT of her sinuses showing diffuse thickening throughout. She also has constant throat clearing during the day, and especially during our visit. She has ongoing reflux symptoms, but does not feel they are severe. She has had allergy testing in the past and was told it was positive, but was never put on allergy vaccine. She has not had a recent chest x-ray or pulmonary function studies.   Review of Systems  Constitutional: Negative for fever and unexpected weight change.  HENT: Positive for sneezing, sore throat and voice change. Negative for congestion, dental problem, ear pain, nosebleeds, postnasal drip, rhinorrhea, sinus pressure and trouble swallowing.   Eyes: Negative for redness and itching.  Respiratory: Positive for cough, chest tightness and shortness of breath. Negative for wheezing.   Cardiovascular: Negative for palpitations and leg swelling.  Gastrointestinal: Negative for nausea and vomiting.  Genitourinary: Negative for dysuria.  Musculoskeletal: Negative for joint swelling.  Skin: Negative for  rash.  Neurological: Positive for headaches.  Hematological: Does not bruise/bleed easily.  Psychiatric/Behavioral: Negative for dysphoric mood. The patient is not nervous/anxious.        Objective:   Physical Exam Constitutional:  Obese female, no acute distress  HENT:  Nares patent without discharge, but swollen turbinates with narrowing  Oropharynx without exudate, palate and uvula are elongated.  Eyes:  Perrla, eomi, no scleral icterus  Neck:  No JVD, no TMG  Cardiovascular:  Normal rate, regular rhythm, no rubs or gallops.  No murmurs        Intact distal pulses  Pulmonary :  Normal breath sounds, no stridor or respiratory distress   No rales, rhonchi, or wheezing  Abdominal:  Soft, nondistended, bowel sounds present.  No tenderness noted.   Musculoskeletal:  mild lower extremity edema noted.  Lymph Nodes:  No cervical lymphadenopathy noted  Skin:  No cyanosis noted  Neurologic:  Alert, appropriate, moves all 4 extremities without obvious deficit.         Assessment & Plan:

## 2013-08-31 NOTE — Assessment & Plan Note (Signed)
The patient has a chronic cough along with hoarseness that I suspect is more upper airway than lower.  She most likely has an element of postnasal drip given her history of chronic sinus disease and ongoing nasal congestion, and I suspect that she is having occult reflux as well. She has had an extensive workup for her hoarseness and upper airway dysfunction at [facility], and they to apparently were very concerned about reflux. The patient tells me that she had a very abnormal pH probe monitoring. I will start her on twice a day proton pump inhibitor for this possibility, and see her back in 4 weeks. It remains unclear to me whether she has asthma or not, and her spirometry is normal today on daily medication.  That does not rule out the possibility of asthma since she is on meds, but it does make it very unlikely that her current ongoing symptoms are coming from asthma. I will leave her on her asthma regimen for now, and see if we can improve her upper airway cough. If this does improve, the next step would be to discontinue her inhalers and see if she remained symptom-free.

## 2013-09-03 ENCOUNTER — Encounter (INDEPENDENT_AMBULATORY_CARE_PROVIDER_SITE_OTHER): Payer: Self-pay | Admitting: General Surgery

## 2013-09-03 ENCOUNTER — Ambulatory Visit (INDEPENDENT_AMBULATORY_CARE_PROVIDER_SITE_OTHER): Payer: Medicare Other | Admitting: General Surgery

## 2013-09-03 ENCOUNTER — Encounter (INDEPENDENT_AMBULATORY_CARE_PROVIDER_SITE_OTHER): Payer: Self-pay

## 2013-09-03 VITALS — BP 112/68 | HR 74 | Resp 18 | Ht 65.5 in | Wt 250.0 lb

## 2013-09-03 DIAGNOSIS — D249 Benign neoplasm of unspecified breast: Secondary | ICD-10-CM

## 2013-09-03 DIAGNOSIS — D242 Benign neoplasm of left breast: Secondary | ICD-10-CM

## 2013-09-03 NOTE — Progress Notes (Signed)
Patient ID: Angela Chase, female   DOB: November 24, 1945, 68 y.o.   MRN: [id number]  Chief Complaint  Patient presents with  . Other    Eval bilateral breast lesions    HPI Angela Chase is a 68 y.o. female.  Referred by Dr Tula Nakayama HPI This is a 68 year old female who presents after undergoing a routine screening mammogram. She had bilateral microcalcifications. She has no complaints referable to either breast. She does have a family history of breast cancer her mother. The left-sided biopsy showed a sclerosed intraductal papilloma with calcifications. The right side showing hyalinized fibroadenoma with calcifications. I discussed this with radiology. The right side is concordant and they feel this can be followed by mammography. They recommended excision on the left side.  She also has recently been diagnosed with sleep apnea as been seen by pulmonology. She does not have her CPAP machine yet. Past Medical History  Diagnosis Date  . Mixed hyperlipidemia   . Obstructive sleep apnea   . Chronic bronchitis   . Osteoarthritis   . GERD (gastroesophageal reflux disease) 1995  . Asthma 2010    Past Surgical History  Procedure Laterality Date  . Rotator cuff repair Right   . Cholecystectomy      Family History  Problem Relation Age of Onset  . Cancer Father   . Cancer Mother   . Heart attack Mother   . Cancer Brother     Social History History  Substance Use Topics  . Smoking status: Never Smoker   . Smokeless tobacco: Never Used  . Alcohol Use: No    Allergies  Allergen Reactions  . Cephalexin Hives  . Omeprazole Hives  . Rofecoxib Rash    Current Outpatient Prescriptions  Medication Sig Dispense Refill  . albuterol (PROVENTIL HFA;VENTOLIN HFA) 108 (90 BASE) MCG/ACT inhaler Inhale 2 puffs into the lungs every 6 (six) hours as needed for wheezing or shortness of breath.  18 g  0  . budesonide-formoterol (SYMBICORT) 160-4.5 MCG/ACT inhaler Inhale 2 puffs  into the lungs 2 (two) times daily.  1 Inhaler  12  . FLUoxetine (PROZAC) 20 MG capsule Take 1 capsule (20 mg total) by mouth daily.  30 capsule  3  . meclizine (ANTIVERT) 12.5 MG tablet Take 1 tablet (12.5 mg total) by mouth 3 (three) times daily as needed for dizziness.  30 tablet  0  . montelukast (SINGULAIR) 10 MG tablet Take 1 tablet (10 mg total) by mouth at bedtime.  30 tablet  3  . omeprazole (PRILOSEC) 40 MG capsule Take 1 capsule (40 mg total) by mouth daily.  30 capsule  3  . phentermine (ADIPEX-P) 37.5 MG tablet Half tablet once daily with breakfast  15 tablet  2  . promethazine-dextromethorphan (PROMETHAZINE-DM) 6.25-15 MG/5ML syrup One teaspoon at bedtime , as needed, for excessive cough  240 mL  0  . temazepam (RESTORIL) 15 MG capsule Take 1 capsule (15 mg total) by mouth at bedtime as needed for sleep.  30 capsule  3  . Vitamin D, Ergocalciferol, (DRISDOL) 50000 UNITS CAPS capsule Take 1 capsule (50,000 Units total) by mouth every 7 (seven) days.  4 capsule  5   No current facility-administered medications for this visit.    Review of Systems Review of Systems  Constitutional: Negative for fever, chills and unexpected weight change.  HENT: Negative for congestion, hearing loss, sore throat, trouble swallowing and voice change.   Eyes: Negative for visual disturbance.  Respiratory: Negative for cough  and wheezing.   Cardiovascular: Negative for chest pain, palpitations and leg swelling.  Gastrointestinal: Negative for nausea, vomiting, abdominal pain, diarrhea, constipation, blood in stool, abdominal distention and anal bleeding.  Genitourinary: Negative for hematuria, vaginal bleeding and difficulty urinating.  Musculoskeletal: Negative for arthralgias.  Skin: Negative for rash and wound.  Neurological: Negative for seizures, syncope and headaches.  Hematological: Negative for adenopathy. Does not bruise/bleed easily.  Psychiatric/Behavioral: Negative for confusion.     Blood pressure 112/68, pulse 74, resp. rate 18, height 5' 5.5" (1.664 m), weight 250 lb (113.399 kg).  Physical Exam Physical Exam  Vitals reviewed. Constitutional: She appears well-developed and well-nourished.  Cardiovascular: Normal rate, regular rhythm and normal heart sounds.   Pulmonary/Chest: Effort normal and breath sounds normal. She has no wheezes. She has no rales. Right breast exhibits no inverted nipple, no mass, no nipple discharge, no skin change and no tenderness. Left breast exhibits no inverted nipple, no mass, no nipple discharge, no skin change and no tenderness.  Lymphadenopathy:    She has no cervical adenopathy.    She has no axillary adenopathy.       Right: No supraclavicular adenopathy present.       Left: No supraclavicular adenopathy present.    Data Reviewed EXAM:  DIGITAL DIAGNOSTIC BILATERAL MAMMOGRAM WITH CAD  COMPARISON: July 01, 2013, August 06, 2007, June 05, 2006  ACR Breast Density Category c: The breast tissue is heterogeneously  dense, which may obscure small masses.  FINDINGS:  Right CC view, spot magnification bilateral CC and lateral views are  submitted. There is a group of indeterminate microcalcifications in  the slightly lower left breast. There is a group of indeterminate  microcalcifications in the slightly lateral posterior right breast.  Mammographic images were processed with CAD.  IMPRESSION:  Suspicious findings.  RECOMMENDATION:  Stereotactic core biopsy of calcifications in both breasts. I  explained to the patient at the time of the exam the calcifications  in the right breast are very posterior and may not be obtainable  with stereotactic technique but we will attempt to biopsy with  stereotactic core.  I have discussed the findings and recommendations with the patient.  Results were also provided in writing at the conclusion of the  visit. If applicable, a reminder letter will be sent to the patient  regarding the  next appointment.  BI-RADS CATEGORY 4: Suspicious abnormality - biopsy should be  considered.   Assessment    Left breast papilloma     Plan    Left breast radioactive seed guided excision  We discussed excising this area to ensure there is no other abnormality. I given the option of 6 month followup but due to her family history and concerned July this area excised. The right side were discussed follow by mammogram. I will discuss her case with pulmonology prior to scheduling. She will also need her CPAP machine prior to scheduling.       Rolm Bookbinder 09/03/2013, 10:44 AM

## 2013-09-08 ENCOUNTER — Telehealth: Payer: Self-pay | Admitting: *Deleted

## 2013-09-08 NOTE — Telephone Encounter (Signed)
We received a fax from [facility] requesting surgical clearance for an excision on left breast papilloma under general anesthesia. Pt last OV was on 08-31-13. Please advise on surgical clearance or if pt needs another OV. Thanks. [place] Bing, CMA

## 2013-09-09 ENCOUNTER — Encounter: Payer: Medicare Other | Admitting: Family Medicine

## 2013-09-09 NOTE — Telephone Encounter (Signed)
Quick fax sent to [facility] clearing for surgery from pulmonary standpoint.

## 2013-09-10 ENCOUNTER — Ambulatory Visit (HOSPITAL_COMMUNITY)
Admission: RE | Admit: 2013-09-10 | Discharge: 2013-09-10 | Disposition: A | Discharge status: Home / Self Care | Payer: Medicare Other | Source: Ambulatory Visit | Attending: Family Medicine | Admitting: Family Medicine

## 2013-09-10 DIAGNOSIS — Z13820 Encounter for screening for osteoporosis: Secondary | ICD-10-CM | POA: Insufficient documentation

## 2013-09-13 ENCOUNTER — Other Ambulatory Visit (INDEPENDENT_AMBULATORY_CARE_PROVIDER_SITE_OTHER): Payer: Self-pay | Admitting: General Surgery

## 2013-09-14 ENCOUNTER — Telehealth: Payer: Self-pay | Admitting: *Deleted

## 2013-09-14 ENCOUNTER — Telehealth (INDEPENDENT_AMBULATORY_CARE_PROVIDER_SITE_OTHER): Payer: Self-pay

## 2013-09-14 NOTE — Telephone Encounter (Signed)
Called pt to clarify with the pt that she was ok with getting scheduled for surgery by Dr Donne Hazel. I advised pt that I was waiting to hear back from her about getting the c-pap machine but never got a call from the pt. The pt did get her c-pap machine and has been using the machine at home. I advised pt that she will need to bring the machine to the hospital the day of surgery. I advised pt that we did receive clearance from Dr Gwenette Greet and the pt acted surprised that Dr Gwenette Greet cleared her for surgery. The pt wanted to know if Dr Donne Hazel spoke to Dr Gwenette Greet about the surgery and the pt needing to be put to sleep. I advised pt that Dr Gwenette Greet sent [place] a note thru the computer but I would send Dr Donne Hazel a message that the pt was concerned about being put to sleep. I offered an appt for the pt to come back into the office to see Dr Donne Hazel again but pt declined apt b/c she said if she is needing the surgery then just go ahead to get is scheduled. I advised pt that I would have Debbie our surgery scheduler to call her back to schedule the surgery. The pt said she was going to contact her PCP to talk to them about the surgery. I advised pt that I would send this message to Dr Donne Hazel and check back with her in a few days to make sure she has no other questions or that she has not changed her mind with coming in for an appt. The pt wants to go ahead and get a surgery date.

## 2013-09-14 NOTE — Telephone Encounter (Signed)
Pt called having questions regarding a surgery Dr. Donne Hazel wants her to do, pt wanted to speak with Dr. Moshe Cipro. Please advise

## 2013-09-15 ENCOUNTER — Telehealth: Payer: Self-pay | Admitting: Family Medicine

## 2013-09-15 NOTE — Telephone Encounter (Signed)
I am attempting to speak directly with Angela Chase, per her request, a message comes on the machine stating that the mailbox is full.I am unable to leave a msg to make her aware that I am returning her call, if she calls back pls get me to the phone

## 2013-09-15 NOTE — Telephone Encounter (Signed)
Spoke directly with patient , I advised that I recommended she have proposed surgery. She did say she was told that without CPAP  If put to sleep she would not wake up and that was a concern, she now has her CPAP and is using it. She intends to proceed with surgery planned for Summer. I advised to schedule appt to furhter discuss if she felt it necessary, states she is comfortable with things as they are at this time

## 2013-09-17 NOTE — Telephone Encounter (Signed)
LMOM for pt to call me. I just want to touch base with her about getting the surgery date and making sure she has no other concerns before surgery.

## 2013-09-21 ENCOUNTER — Other Ambulatory Visit (INDEPENDENT_AMBULATORY_CARE_PROVIDER_SITE_OTHER): Payer: Self-pay | Admitting: General Surgery

## 2013-09-21 DIAGNOSIS — D242 Benign neoplasm of left breast: Secondary | ICD-10-CM

## 2013-09-24 NOTE — Telephone Encounter (Signed)
Called pt again b/c I never heard back from her with the last message left for her at home. I was just wanting to check with her to make sure she was ok with the surgery planned for 10/06/13 left br seed placement with excision for lt br papilloma since the pt did mention concerns about being put to sleep and pt discussing this with her PCP. The pt said she was feeling very nervous about the surgery and she seemed confused. I was going over the date/times again where to be for the seed placement and where to be for the surgery. The pt was getting the information all mixed up and she thought Dr Donne Hazel would be putting in the seed placement not the [facility]. I explained to pt everything that would take place on the 6/8 for the seed placement and 6/10 the surgery date. I did advise pt that she would need someone bringing her to the hospital the day of surgery and she might want someone with her the day of the seed placement if she was that nervous about the procedure. I did advise pt to make sure she brings her c-pap machine to the hospital the day of surgery.   I went to Dr Donne Hazel after getting off the phone with the pt to advise him I think the pt is really confused about the surgery plan and he advised pt needs appt before surgery. I called the pt back to give her an appt date on 09/30/13 arrive at 2:15/2:30. The pt understands.

## 2013-09-30 ENCOUNTER — Encounter (INDEPENDENT_AMBULATORY_CARE_PROVIDER_SITE_OTHER): Payer: Self-pay | Admitting: General Surgery

## 2013-09-30 ENCOUNTER — Ambulatory Visit (INDEPENDENT_AMBULATORY_CARE_PROVIDER_SITE_OTHER): Payer: Medicare Other | Admitting: General Surgery

## 2013-09-30 ENCOUNTER — Encounter (HOSPITAL_BASED_OUTPATIENT_CLINIC_OR_DEPARTMENT_OTHER): Payer: Self-pay | Admitting: *Deleted

## 2013-09-30 VITALS — BP 126/72 | HR 72 | Temp 97.7°F | Resp 16 | Ht 65.0 in | Wt 255.0 lb

## 2013-09-30 DIAGNOSIS — D242 Benign neoplasm of left breast: Secondary | ICD-10-CM

## 2013-09-30 DIAGNOSIS — D249 Benign neoplasm of unspecified breast: Secondary | ICD-10-CM

## 2013-09-30 NOTE — Progress Notes (Signed)
Subjective:     Patient ID: Angela Chase, female   DOB: 07/24/1945, 67 y.o.   MRN: [id number]  HPI 67 yof comes in to discuss surgery.  She now has cpap machine and has been cleared by Dr Clance for surgery in her current condition. She has no changes  Review of Systems     Objective:   Physical Exam deferred    Assessment:     Left breast radioactive seed guided excision     Plan:     Discussed indications, procedure, day of surgery and recovery again today for about 15 minutes.        

## 2013-09-30 NOTE — Progress Notes (Signed)
Pt very nervous-reviewed all meds and instructions-to come in for labs after seeds 6/8-bring all meds and cpap and overnight bag

## 2013-10-04 ENCOUNTER — Ambulatory Visit
Admission: RE | Admit: 2013-10-04 | Discharge: 2013-10-04 | Disposition: A | Discharge status: Home / Self Care | Payer: Medicare Other | Source: Ambulatory Visit | Attending: General Surgery | Admitting: General Surgery

## 2013-10-04 ENCOUNTER — Encounter (HOSPITAL_BASED_OUTPATIENT_CLINIC_OR_DEPARTMENT_OTHER)
Admission: RE | Admit: 2013-10-04 | Discharge: 2013-10-04 | Disposition: A | Discharge status: Home / Self Care | Payer: Medicare Other | Source: Ambulatory Visit | Attending: General Surgery | Admitting: General Surgery

## 2013-10-04 DIAGNOSIS — D242 Benign neoplasm of left breast: Secondary | ICD-10-CM

## 2013-10-04 LAB — BASIC METABOLIC PANEL
BUN: 9 mg/dL (ref 6–23)
CALCIUM: 9.5 mg/dL (ref 8.4–10.5)
CO2: 26 mEq/L (ref 19–32)
CREATININE: 0.74 mg/dL (ref 0.50–1.10)
Chloride: 104 mEq/L (ref 96–112)
GFR calc non Af Amer: 87 mL/min — ABNORMAL LOW (ref >90)
Glucose, Bld: 81 mg/dL (ref 70–99)
Potassium: 3.9 mEq/L (ref 3.7–5.3)
Sodium: 144 mEq/L (ref 137–147)

## 2013-10-04 LAB — CBC WITH DIFFERENTIAL/PLATELET
BASOS ABS: 0 10*3/uL (ref 0.0–0.1)
Basophils Relative: 1 % (ref 0–1)
EOS ABS: 0.2 10*3/uL (ref 0.0–0.7)
Eosinophils Relative: 3 % (ref 0–5)
HCT: 35.3 % — ABNORMAL LOW (ref 36.0–46.0)
HEMOGLOBIN: 12.1 g/dL (ref 12.0–15.0)
Lymphocytes Relative: 30 % (ref 12–46)
Lymphs Abs: 1.8 10*3/uL (ref 0.7–4.0)
MCH: 26.1 pg (ref 26.0–34.0)
MCHC: 34.3 g/dL (ref 30.0–36.0)
MCV: 76.2 fL — ABNORMAL LOW (ref 78.0–100.0)
MONOS PCT: 10 % (ref 3–12)
Monocytes Absolute: 0.6 10*3/uL (ref 0.1–1.0)
NEUTROS PCT: 56 % (ref 43–77)
Neutro Abs: 3.4 10*3/uL (ref 1.7–7.7)
PLATELETS: 225 10*3/uL (ref 150–400)
RBC: 4.63 MIL/uL (ref 3.87–5.11)
RDW: 15.3 % (ref 11.5–15.5)
WBC: 6.1 10*3/uL (ref 4.0–10.5)

## 2013-10-06 ENCOUNTER — Ambulatory Visit (HOSPITAL_BASED_OUTPATIENT_CLINIC_OR_DEPARTMENT_OTHER): Payer: Medicare Other | Admitting: Anesthesiology

## 2013-10-06 ENCOUNTER — Encounter (HOSPITAL_BASED_OUTPATIENT_CLINIC_OR_DEPARTMENT_OTHER)
Admission: RE | Disposition: A | Discharge status: Home / Self Care | Payer: Self-pay | Source: Ambulatory Visit | Attending: General Surgery

## 2013-10-06 ENCOUNTER — Encounter (HOSPITAL_BASED_OUTPATIENT_CLINIC_OR_DEPARTMENT_OTHER): Payer: Medicare Other | Admitting: Anesthesiology

## 2013-10-06 ENCOUNTER — Ambulatory Visit
Admission: RE | Admit: 2013-10-06 | Discharge: 2013-10-06 | Disposition: A | Discharge status: Home / Self Care | Payer: Medicare Other | Source: Ambulatory Visit | Attending: General Surgery | Admitting: General Surgery

## 2013-10-06 ENCOUNTER — Encounter (HOSPITAL_BASED_OUTPATIENT_CLINIC_OR_DEPARTMENT_OTHER): Payer: Self-pay | Admitting: Anesthesiology

## 2013-10-06 ENCOUNTER — Ambulatory Visit (HOSPITAL_BASED_OUTPATIENT_CLINIC_OR_DEPARTMENT_OTHER)
Admission: RE | Admit: 2013-10-06 | Discharge: 2013-10-07 | Disposition: A | Discharge status: Home / Self Care | Payer: Medicare Other | Source: Ambulatory Visit | Attending: General Surgery | Admitting: General Surgery

## 2013-10-06 DIAGNOSIS — Z888 Allergy status to other drugs, medicaments and biological substances status: Secondary | ICD-10-CM | POA: Insufficient documentation

## 2013-10-06 DIAGNOSIS — J45909 Unspecified asthma, uncomplicated: Secondary | ICD-10-CM | POA: Insufficient documentation

## 2013-10-06 DIAGNOSIS — D242 Benign neoplasm of left breast: Secondary | ICD-10-CM

## 2013-10-06 DIAGNOSIS — G4733 Obstructive sleep apnea (adult) (pediatric): Secondary | ICD-10-CM | POA: Insufficient documentation

## 2013-10-06 DIAGNOSIS — E785 Hyperlipidemia, unspecified: Secondary | ICD-10-CM | POA: Insufficient documentation

## 2013-10-06 DIAGNOSIS — Z9889 Other specified postprocedural states: Secondary | ICD-10-CM

## 2013-10-06 DIAGNOSIS — D249 Benign neoplasm of unspecified breast: Secondary | ICD-10-CM | POA: Insufficient documentation

## 2013-10-06 DIAGNOSIS — N6089 Other benign mammary dysplasias of unspecified breast: Secondary | ICD-10-CM

## 2013-10-06 DIAGNOSIS — Z881 Allergy status to other antibiotic agents status: Secondary | ICD-10-CM | POA: Insufficient documentation

## 2013-10-06 DIAGNOSIS — K219 Gastro-esophageal reflux disease without esophagitis: Secondary | ICD-10-CM | POA: Insufficient documentation

## 2013-10-06 DIAGNOSIS — M1990 Unspecified osteoarthritis, unspecified site: Secondary | ICD-10-CM | POA: Insufficient documentation

## 2013-10-06 DIAGNOSIS — Z79899 Other long term (current) drug therapy: Secondary | ICD-10-CM | POA: Insufficient documentation

## 2013-10-06 HISTORY — DX: Depression, unspecified: F32.A

## 2013-10-06 HISTORY — DX: Presence of spectacles and contact lenses: Z97.3

## 2013-10-06 HISTORY — DX: Anxiety disorder, unspecified: F41.9

## 2013-10-06 HISTORY — DX: Major depressive disorder, single episode, unspecified: F32.9

## 2013-10-06 HISTORY — DX: Dysphonia: R49.0

## 2013-10-06 SURGERY — RADIOACTIVE SEED GUIDED BREAST BIOPSY
Anesthesia: General | Site: Breast | Laterality: Left

## 2013-10-06 MED ORDER — BUDESONIDE-FORMOTEROL FUMARATE 160-4.5 MCG/ACT IN AERO: 2.0000 | INHALATION_SPRAY | Freq: Two times a day (BID) | RESPIRATORY_TRACT | Status: DC

## 2013-10-06 MED ORDER — SODIUM CHLORIDE 0.9 % IV SOLN
INTRAVENOUS | Status: DC
  Administered 2013-10-06: 50 mL/h via INTRAVENOUS

## 2013-10-06 MED ORDER — ONDANSETRON HCL 4 MG/2ML IJ SOLN: 4.0000 mg | Freq: Four times a day (QID) | INTRAMUSCULAR | Status: DC | PRN

## 2013-10-06 MED ORDER — HYDROMORPHONE HCL PF 1 MG/ML IJ SOLN
INTRAMUSCULAR | Status: AC
  Filled 2013-10-06: qty 1

## 2013-10-06 MED ORDER — LIDOCAINE HCL (CARDIAC) 20 MG/ML IV SOLN
INTRAVENOUS | Status: DC | PRN
  Administered 2013-10-06: 50 mg via INTRAVENOUS

## 2013-10-06 MED ORDER — VANCOMYCIN HCL 10 G IV SOLR
1500.0000 mg | INTRAVENOUS | Status: AC
  Administered 2013-10-06 (×2): 1500 mg via INTRAVENOUS

## 2013-10-06 MED ORDER — VANCOMYCIN HCL IN DEXTROSE 500-5 MG/100ML-% IV SOLN
INTRAVENOUS | Status: AC
  Filled 2013-10-06: qty 100

## 2013-10-06 MED ORDER — MIDAZOLAM HCL 5 MG/5ML IJ SOLN
INTRAMUSCULAR | Status: DC | PRN
  Administered 2013-10-06: 2 mg via INTRAVENOUS

## 2013-10-06 MED ORDER — BUPIVACAINE HCL (PF) 0.25 % IJ SOLN
INTRAMUSCULAR | Status: DC | PRN
  Administered 2013-10-06: 10 mL

## 2013-10-06 MED ORDER — MIDAZOLAM HCL 2 MG/2ML IJ SOLN: 1.0000 mg | INTRAMUSCULAR | Status: DC | PRN

## 2013-10-06 MED ORDER — MORPHINE SULFATE 2 MG/ML IJ SOLN
2.0000 mg | INTRAMUSCULAR | Status: DC | PRN
  Administered 2013-10-06: 2 mg via INTRAVENOUS
  Filled 2013-10-06: qty 1

## 2013-10-06 MED ORDER — ACETAMINOPHEN 650 MG RE SUPP: 650.0000 mg | Freq: Four times a day (QID) | RECTAL | Status: DC | PRN

## 2013-10-06 MED ORDER — PHENYLEPHRINE HCL 10 MG/ML IJ SOLN
INTRAMUSCULAR | Status: DC | PRN
  Administered 2013-10-06 (×2): 80 ug via INTRAVENOUS

## 2013-10-06 MED ORDER — FENTANYL CITRATE 0.05 MG/ML IJ SOLN
INTRAMUSCULAR | Status: AC
  Filled 2013-10-06: qty 8

## 2013-10-06 MED ORDER — BUPIVACAINE HCL (PF) 0.25 % IJ SOLN
INTRAMUSCULAR | Status: AC
  Filled 2013-10-06: qty 30

## 2013-10-06 MED ORDER — ALBUTEROL SULFATE HFA 108 (90 BASE) MCG/ACT IN AERS: 2.0000 | INHALATION_SPRAY | Freq: Four times a day (QID) | RESPIRATORY_TRACT | Status: DC | PRN

## 2013-10-06 MED ORDER — LACTATED RINGERS IV SOLN
INTRAVENOUS | Status: DC
  Administered 2013-10-06: 12:00:00 via INTRAVENOUS
  Administered 2013-10-06: 10 mL/h via INTRAVENOUS

## 2013-10-06 MED ORDER — FLUOXETINE HCL 20 MG PO CAPS: 20.0000 mg | ORAL_CAPSULE | Freq: Every day | ORAL | Status: DC

## 2013-10-06 MED ORDER — FENTANYL CITRATE 0.05 MG/ML IJ SOLN
INTRAMUSCULAR | Status: DC | PRN
  Administered 2013-10-06: 25 ug via INTRAVENOUS
  Administered 2013-10-06: 50 ug via INTRAVENOUS
  Administered 2013-10-06: 25 ug via INTRAVENOUS

## 2013-10-06 MED ORDER — OXYCODONE HCL 5 MG PO TABS: 5.0000 mg | ORAL_TABLET | Freq: Once | ORAL | Status: AC | PRN

## 2013-10-06 MED ORDER — MIDAZOLAM HCL 2 MG/2ML IJ SOLN
INTRAMUSCULAR | Status: AC
  Filled 2013-10-06: qty 2

## 2013-10-06 MED ORDER — PANTOPRAZOLE SODIUM 40 MG PO TBEC: 40.0000 mg | DELAYED_RELEASE_TABLET | Freq: Every day | ORAL | Status: DC

## 2013-10-06 MED ORDER — EPHEDRINE SULFATE 50 MG/ML IJ SOLN
INTRAMUSCULAR | Status: DC | PRN
  Administered 2013-10-06 (×2): 10 mg via INTRAVENOUS

## 2013-10-06 MED ORDER — ONDANSETRON HCL 4 MG/2ML IJ SOLN
INTRAMUSCULAR | Status: DC | PRN
  Administered 2013-10-06: 4 mg via INTRAVENOUS

## 2013-10-06 MED ORDER — TEMAZEPAM 15 MG PO CAPS: 15.0000 mg | ORAL_CAPSULE | Freq: Every evening | ORAL | Status: DC | PRN

## 2013-10-06 MED ORDER — VANCOMYCIN HCL IN DEXTROSE 1-5 GM/200ML-% IV SOLN
INTRAVENOUS | Status: AC
  Filled 2013-10-06: qty 200

## 2013-10-06 MED ORDER — OXYCODONE HCL 5 MG/5ML PO SOLN
5.0000 mg | Freq: Once | ORAL | Status: AC | PRN
Start: 2013-10-06 — End: 2013-10-06

## 2013-10-06 MED ORDER — FENTANYL CITRATE 0.05 MG/ML IJ SOLN: 50.0000 ug | INTRAMUSCULAR | Status: DC | PRN

## 2013-10-06 MED ORDER — HYDROMORPHONE HCL PF 1 MG/ML IJ SOLN
0.2500 mg | INTRAMUSCULAR | Status: DC | PRN
  Administered 2013-10-06 (×4): 0.5 mg via INTRAVENOUS

## 2013-10-06 MED ORDER — MONTELUKAST SODIUM 10 MG PO TABS: 10.0000 mg | ORAL_TABLET | Freq: Every day | ORAL | Status: DC

## 2013-10-06 MED ORDER — OXYCODONE HCL 5 MG PO TABS
5.0000 mg | ORAL_TABLET | ORAL | Status: DC | PRN
  Administered 2013-10-06 – 2013-10-07 (×4): 5 mg via ORAL
  Filled 2013-10-06 (×4): qty 1

## 2013-10-06 MED ORDER — VANCOMYCIN HCL 10 G IV SOLR: 1500.0000 mg | INTRAVENOUS | Status: DC

## 2013-10-06 MED ORDER — ACETAMINOPHEN 325 MG PO TABS: 650.0000 mg | ORAL_TABLET | Freq: Four times a day (QID) | ORAL | Status: DC | PRN

## 2013-10-06 MED ORDER — PROPOFOL 10 MG/ML IV BOLUS
INTRAVENOUS | Status: DC | PRN
  Administered 2013-10-06: 150 mg via INTRAVENOUS
  Administered 2013-10-06: 50 mg via INTRAVENOUS

## 2013-10-06 MED ORDER — DEXAMETHASONE SODIUM PHOSPHATE 4 MG/ML IJ SOLN
INTRAMUSCULAR | Status: DC | PRN
  Administered 2013-10-06: 10 mg via INTRAVENOUS

## 2013-10-06 SURGICAL SUPPLY — 59 items
ADH SKN CLS APL DERMABOND .7 (GAUZE/BANDAGES/DRESSINGS) ×1
APL SKNCLS STERI-STRIP NONHPOA (GAUZE/BANDAGES/DRESSINGS)
APPLIER CLIP 9.375 MED OPEN (MISCELLANEOUS)
APR CLP MED 9.3 20 MLT OPN (MISCELLANEOUS)
BENZOIN TINCTURE PRP APPL 2/3 (GAUZE/BANDAGES/DRESSINGS) IMPLANT
BINDER BREAST LRG (GAUZE/BANDAGES/DRESSINGS) IMPLANT
BINDER BREAST MEDIUM (GAUZE/BANDAGES/DRESSINGS) IMPLANT
BINDER BREAST XLRG (GAUZE/BANDAGES/DRESSINGS) IMPLANT
BINDER BREAST XXLRG (GAUZE/BANDAGES/DRESSINGS) IMPLANT
BLADE 15 SAFETY STRL DISP (BLADE) ×3 IMPLANT
CANISTER SUC SOCK COL 7IN (MISCELLANEOUS) IMPLANT
CANISTER SUCT 1200ML W/VALVE (MISCELLANEOUS) IMPLANT
CHLORAPREP W/TINT 26ML (MISCELLANEOUS) ×3 IMPLANT
CLIP APPLIE 9.375 MED OPEN (MISCELLANEOUS) IMPLANT
CLOSURE WOUND 1/2 X4 (GAUZE/BANDAGES/DRESSINGS) ×1
COVER MAYO STAND STRL (DRAPES) ×3 IMPLANT
COVER PROBE W GEL 5X96 (DRAPES) ×3 IMPLANT
COVER TABLE BACK 60X90 (DRAPES) ×3 IMPLANT
DECANTER SPIKE VIAL GLASS SM (MISCELLANEOUS) ×2 IMPLANT
DERMABOND ADVANCED (GAUZE/BANDAGES/DRESSINGS) ×2
DERMABOND ADVANCED .7 DNX12 (GAUZE/BANDAGES/DRESSINGS) ×1 IMPLANT
DEVICE DUBIN W/COMP PLATE 8390 (MISCELLANEOUS) ×3 IMPLANT
DRAPE PED LAPAROTOMY (DRAPES) ×3 IMPLANT
DRSG TEGADERM 4X4.75 (GAUZE/BANDAGES/DRESSINGS) IMPLANT
ELECT COATED BLADE 2.86 ST (ELECTRODE) ×3 IMPLANT
ELECT REM PT RETURN 9FT ADLT (ELECTROSURGICAL) ×3
ELECTRODE REM PT RTRN 9FT ADLT (ELECTROSURGICAL) ×1 IMPLANT
GLOVE BIO SURGEON STRL SZ7 (GLOVE) ×8 IMPLANT
GLOVE BIOGEL PI IND STRL 7.5 (GLOVE) ×1 IMPLANT
GLOVE BIOGEL PI INDICATOR 7.5 (GLOVE) ×4
GLOVE EXAM NITRILE EXT CUFF MD (GLOVE) ×2 IMPLANT
GOWN STRL REUS W/ TWL LRG LVL3 (GOWN DISPOSABLE) ×2 IMPLANT
GOWN STRL REUS W/TWL LRG LVL3 (GOWN DISPOSABLE) ×6
KIT MARKER MARGIN INK (KITS) ×3 IMPLANT
NDL HYPO 25X1 1.5 SAFETY (NEEDLE) ×1 IMPLANT
NEEDLE HYPO 25X1 1.5 SAFETY (NEEDLE) ×3 IMPLANT
NS IRRIG 1000ML POUR BTL (IV SOLUTION) IMPLANT
PACK BASIN DAY SURGERY FS (CUSTOM PROCEDURE TRAY) ×3 IMPLANT
PENCIL BUTTON HOLSTER BLD 10FT (ELECTRODE) ×3 IMPLANT
SHEET MEDIUM DRAPE 40X70 STRL (DRAPES) IMPLANT
SLEEVE SCD COMPRESS KNEE MED (MISCELLANEOUS) ×3 IMPLANT
SPONGE GAUZE 4X4 12PLY STER LF (GAUZE/BANDAGES/DRESSINGS) IMPLANT
SPONGE LAP 4X18 X RAY DECT (DISPOSABLE) ×3 IMPLANT
STAPLER VISISTAT 35W (STAPLE) IMPLANT
STRIP CLOSURE SKIN 1/2X4 (GAUZE/BANDAGES/DRESSINGS) ×2 IMPLANT
SUT MNCRL AB 4-0 PS2 18 (SUTURE) IMPLANT
SUT MON AB 5-0 PS2 18 (SUTURE) ×2 IMPLANT
SUT SILK 2 0 SH (SUTURE) IMPLANT
SUT VIC AB 2-0 SH 27 (SUTURE) ×3
SUT VIC AB 2-0 SH 27XBRD (SUTURE) ×1 IMPLANT
SUT VIC AB 3-0 SH 27 (SUTURE) ×3
SUT VIC AB 3-0 SH 27X BRD (SUTURE) ×1 IMPLANT
SUT VIC AB 5-0 PS2 18 (SUTURE) IMPLANT
SYR CONTROL 10ML LL (SYRINGE) ×3 IMPLANT
TOWEL OR 17X24 6PK STRL BLUE (TOWEL DISPOSABLE) ×3 IMPLANT
TOWEL OR NON WOVEN STRL DISP B (DISPOSABLE) ×3 IMPLANT
TUBE CONNECTING 20'X1/4 (TUBING)
TUBE CONNECTING 20X1/4 (TUBING) IMPLANT
YANKAUER SUCT BULB TIP NO VENT (SUCTIONS) IMPLANT

## 2013-10-06 NOTE — Anesthesia Procedure Notes (Signed)
Procedure Name: LMA Insertion Date/Time: 10/06/2013 12:52 PM Performed by: Toula Moos L Pre-anesthesia Checklist: Patient identified, Emergency Drugs available, Suction available, Patient being monitored and Timeout performed Patient Re-evaluated:Patient Re-evaluated prior to inductionOxygen Delivery Method: Circle System Utilized Preoxygenation: Pre-oxygenation with 100% oxygen Intubation Type: IV induction Ventilation: Mask ventilation without difficulty LMA: LMA inserted LMA Size: 4.0 Number of attempts: 1 Airway Equipment and Method: bite block Placement Confirmation: positive ETCO2 and breath sounds checked- equal and bilateral Tube secured with: Tape Dental Injury: Teeth and Oropharynx as per pre-operative assessment

## 2013-10-06 NOTE — H&P (Signed)
This is a 68 year old female who presents after undergoing a routine screening mammogram. She had bilateral microcalcifications. She has no complaints referable to either breast. She does have a family history of breast cancer her mother. The left-sided biopsy showed a sclerosed intraductal papilloma with calcifications. The right side showing hyalinized fibroadenoma with calcifications. I discussed this with radiology. The right side is concordant and they feel this can be followed by mammography. They recommended excision on the left side.  She also has recently been diagnosed with sleep apnea as been seen by pulmonology. She has her cpap machine now  Past Medical History   Diagnosis  Date   .  Mixed hyperlipidemia    .  Obstructive sleep apnea    .  Chronic bronchitis    .  Osteoarthritis    .  GERD (gastroesophageal reflux disease)  1995   .  Asthma  2010    Past Surgical History   Procedure  Laterality  Date   .  Rotator cuff repair  Right    .  Cholecystectomy      Family History   Problem  Relation  Age of Onset   .  Cancer  Father    .  Cancer  Mother    .  Heart attack  Mother    .  Cancer  Brother    Social History  History   Substance Use Topics   .  Smoking status:  Never Smoker   .  Smokeless tobacco:  Never Used   .  Alcohol Use:  No    Allergies   Allergen  Reactions   .  Cephalexin  Hives   .  Omeprazole  Hives   .  Rofecoxib  Rash    Current Outpatient Prescriptions   Medication  Sig  Dispense  Refill   .  albuterol (PROVENTIL HFA;VENTOLIN HFA) 108 (90 BASE) MCG/ACT inhaler  Inhale 2 puffs into the lungs every 6 (six) hours as needed for wheezing or shortness of breath.  18 g  0   .  budesonide-formoterol (SYMBICORT) 160-4.5 MCG/ACT inhaler  Inhale 2 puffs into the lungs 2 (two) times daily.  1 Inhaler  12   .  FLUoxetine (PROZAC) 20 MG capsule  Take 1 capsule (20 mg total) by mouth daily.  30 capsule  3   .  meclizine (ANTIVERT) 12.5 MG tablet  Take 1  tablet (12.5 mg total) by mouth 3 (three) times daily as needed for dizziness.  30 tablet  0   .  montelukast (SINGULAIR) 10 MG tablet  Take 1 tablet (10 mg total) by mouth at bedtime.  30 tablet  3   .  omeprazole (PRILOSEC) 40 MG capsule  Take 1 capsule (40 mg total) by mouth daily.  30 capsule  3   .  phentermine (ADIPEX-P) 37.5 MG tablet  Half tablet once daily with breakfast  15 tablet  2   .  promethazine-dextromethorphan (PROMETHAZINE-DM) 6.25-15 MG/5ML syrup  One teaspoon at bedtime , as needed, for excessive cough  240 mL  0   .  temazepam (RESTORIL) 15 MG capsule  Take 1 capsule (15 mg total) by mouth at bedtime as needed for sleep.  30 capsule  3   .  Vitamin D, Ergocalciferol, (DRISDOL) 50000 UNITS CAPS capsule  Take 1 capsule (50,000 Units total) by mouth every 7 (seven) days.  4 capsule  5    No current facility-administered medications for this visit.    Physical  Exam  Vitals reviewed.  Constitutional: She appears well-developed and well-nourished.  Cardiovascular: Normal rate, regular rhythm and normal heart sounds.  Pulmonary/Chest: Effort normal and breath sounds normal. She has no wheezes. She has no rales. Right breast exhibits no inverted nipple, no mass, no nipple discharge, no skin change and no tenderness. Left breast exhibits no inverted nipple, no mass, no nipple discharge, no skin change and no tenderness.   Data Reviewed  EXAM:  DIGITAL DIAGNOSTIC BILATERAL MAMMOGRAM WITH CAD  COMPARISON: July 01, 2013, August 06, 2007, June 05, 2006  ACR Breast Density Category c: The breast tissue is heterogeneously  dense, which may obscure small masses.  FINDINGS:  Right CC view, spot magnification bilateral CC and lateral views are  submitted. There is a group of indeterminate microcalcifications in  the slightly lower left breast. There is a group of indeterminate  microcalcifications in the slightly lateral posterior right breast.  Mammographic images were processed with  CAD.  IMPRESSION:  Suspicious findings.  RECOMMENDATION:  Stereotactic core biopsy of calcifications in both breasts. I  explained to the patient at the time of the exam the calcifications  in the right breast are very posterior and may not be obtainable  with stereotactic technique but we will attempt to biopsy with  stereotactic core.  I have discussed the findings and recommendations with the patient.  Results were also provided in writing at the conclusion of the  visit. If applicable, a reminder letter will be sent to the patient  regarding the next appointment.  BI-RADS CATEGORY 4: Suspicious abnormality - biopsy should be  considered.  Assessment  Left breast papilloma  Plan  Left breast radioactive seed guided excision

## 2013-10-06 NOTE — Anesthesia Preprocedure Evaluation (Signed)
Anesthesia Evaluation  Patient identified by MRN, date of birth, ID band Patient awake    Reviewed: Allergy & Precautions, H&P , NPO status , Patient's Chart, lab work & pertinent test results  Airway Mallampati: III TM Distance: >3 FB Neck ROM: Full    Dental no notable dental hx. (+) Teeth Intact, Dental Advisory Given   Pulmonary asthma , sleep apnea and Continuous Positive Airway Pressure Ventilation ,  breath sounds clear to auscultation  Pulmonary exam normal       Cardiovascular negative cardio ROS  Rhythm:Regular Rate:Normal     Neuro/Psych negative neurological ROS  negative psych ROS   GI/Hepatic Neg liver ROS, GERD-  Medicated and Controlled,  Endo/Other  Morbid obesity  Renal/GU negative Renal ROS  negative genitourinary   Musculoskeletal   Abdominal   Peds  Hematology negative hematology ROS (+)   Anesthesia Other Findings   Reproductive/Obstetrics negative OB ROS                           Anesthesia Physical Anesthesia Plan  ASA: III  Anesthesia Plan: General   Post-op Pain Management:    Induction: Intravenous  Airway Management Planned: LMA  Additional Equipment:   Intra-op Plan:   Post-operative Plan: Extubation in OR  Informed Consent: I have reviewed the patients History and Physical, chart, labs and discussed the procedure including the risks, benefits and alternatives for the proposed anesthesia with the patient or authorized representative who has indicated his/her understanding and acceptance.   Dental advisory given  Plan Discussed with: CRNA  Anesthesia Plan Comments:         Anesthesia Quick Evaluation

## 2013-10-06 NOTE — H&P (View-Only) (Signed)
Subjective:     Patient ID: Angela Chase, female   DOB: 1945-12-27, 68 y.o.   MRN: [id number]  HPI 69 yof comes in to discuss surgery.  She now has cpap machine and has been cleared by Dr Gwenette Greet for surgery in her current condition. She has no changes  Review of Systems     Objective:   Physical Exam deferred    Assessment:     Left breast radioactive seed guided excision     Plan:     Discussed indications, procedure, day of surgery and recovery again today for about 15 minutes.

## 2013-10-06 NOTE — Interval H&P Note (Signed)
History and Physical Interval Note:  10/06/2013 12:33 PM  Angela Chase  has presented today for surgery, with the diagnosis of left breast abnornal mamogram  The various methods of treatment have been discussed with the patient and family. After consideration of risks, benefits and other options for treatment, the patient has consented to  Procedure(s): RADIOACTIVE SEED GUIDED EXCISIONAL BREAST BIOPSY (Left) as a surgical intervention .  The patient's history has been reviewed, patient examined, no change in status, stable for surgery.  I have reviewed the patient's chart and labs.  Questions were answered to the patient's satisfaction.     [name]

## 2013-10-06 NOTE — Op Note (Signed)
Preoperative diagnosis: Left breast papilloma Postoperative diagnosis: same as above Procedure: Left breast radioactive seed guided excisional biopsy Surgeon: Dr Serita Grammes Anesthesia: same as above EBL: minimal Drains: none Specimen: left breast marked with paint Sponge and needle count was correct Disposition to recovery stable  Indications: This is a 41 yof with bilateral mammographic abnormalities who underwent biopsy of both breast and the left side was a papilloma for which excision was recommended.  We placed a radioactive seed prior to surgery. Risks/benefits/indications were discussed with patient.  Procedure:  I had mammograms in the operating room. After informed consent was obtained she was taken to the OR.  She was given 2 g of cefazolin. Sequential compression devices were on her legs. She was then placed under general anesthesia without complication. Her left breast was prepped and draped in the standard sterile surgical fashion. A surgical timeout was then performed. I identified the location of the radioactive seed with the neoprobe. I then made a periareolar incision at the superior aspect of areola. I then used the neoprobe to guide the excision of the seed and the surrounding tissue. I then removed this. I did a mammogram which confirmed removal of the clip abd the seed. This was confirmed by radiology. The seed was present in the specimen by the neoprobe also. This was then sent to pathology.  I then closed the cavity with 2-0 Vicryl. The dermis was closed with 3-0 Vicryl and the skin with 4-0 Monocryl. Dermabond and Steri-Strips were applied. The area was infiltrated with Marcaine.

## 2013-10-06 NOTE — Transfer of Care (Signed)
Immediate Anesthesia Transfer of Care Note  Patient: Angela Chase  Procedure(s) Performed: Procedure(s): RADIOACTIVE SEED GUIDED EXCISIONAL BREAST BIOPSY (Left)  Patient Location: PACU  Anesthesia Type:General  Level of Consciousness: awake, sedated and patient cooperative  Airway & Oxygen Therapy: Patient Spontanous Breathing and Patient connected to face mask oxygen  Post-op Assessment: Report given to PACU RN and Post -op Vital signs reviewed and stable  Post vital signs: Reviewed and stable  Complications: No apparent anesthesia complications

## 2013-10-06 NOTE — Anesthesia Postprocedure Evaluation (Signed)
Anesthesia Post Note  Patient: Angela Chase  Procedure(s) Performed: Procedure(s) (LRB): RADIOACTIVE SEED GUIDED EXCISIONAL BREAST BIOPSY (Left)  Anesthesia type: General  Patient location: PACU  Post pain: Pain level controlled  Post assessment: Patient's Cardiovascular Status Stable  Last Vitals:  Filed Vitals:   10/06/13 1500  BP: 112/61  Pulse: 76  Temp:   Resp: 21    Post vital signs: Reviewed and stable  Level of consciousness: alert  Complications: No apparent anesthesia complications

## 2013-10-07 MED ORDER — OXYCODONE HCL 5 MG PO TABS: 5.0000 mg | ORAL_TABLET | ORAL | Status: DC | PRN

## 2013-10-07 NOTE — Discharge Instructions (Signed)
[facility] °Office Phone Number [phone] ° °BREAST BIOPSY/ PARTIAL MASTECTOMY: POST OP INSTRUCTIONS ° °Always review your discharge instruction sheet given to you by the facility where your surgery was performed. ° °IF YOU HAVE DISABILITY OR FAMILY LEAVE FORMS, YOU MUST BRING THEM TO THE OFFICE FOR PROCESSING.  DO NOT GIVE THEM TO YOUR DOCTOR. ° °1. A prescription for pain medication may be given to you upon discharge.  Take your pain medication as prescribed, if needed.  If narcotic pain medicine is not needed, then you may take acetaminophen (Tylenol), naprosyn (Alleve) or ibuprofen (Advil) as needed. °2. Take your usually prescribed medications unless otherwise directed °3. If you need a refill on your pain medication, please contact your pharmacy.  They will contact our office to request authorization.  Prescriptions will not be filled after 5pm or on week-ends. °4. You should eat very light the first 24 hours after surgery, such as soup, crackers, pudding, etc.  Resume your normal diet the day after surgery. °5. Most patients will experience some swelling and bruising in the breast.  Ice packs and a good support bra will help.  Wear the breast binder provided or a sports bra for 72 hours day and night.  After that wear a sports bra during the day until you return to the office. Swelling and bruising can take several days to resolve.  °6. It is common to experience some constipation if taking pain medication after surgery.  Increasing fluid intake and taking a stool softener will usually help or prevent this problem from occurring.  A mild laxative (Milk of Magnesia or Miralax) should be taken according to package directions if there are no bowel movements after 48 hours. °7. Unless discharge instructions indicate otherwise, you may remove your bandages 48 hours after surgery and you may shower at that time.  You may have steri-strips (small skin tapes) in place directly over the incision.   These strips should be left on the skin for 7-10 days and will come off on their own.  If your surgeon used skin glue on the incision, you may shower in 24 hours.  The glue will flake off over the next 2-3 weeks.  Any sutures or staples will be removed at the office during your follow-up visit. °8. ACTIVITIES:  You may resume regular daily activities (gradually increasing) beginning the next day.  Wearing a good support bra or sports bra minimizes pain and swelling.  You may have sexual intercourse when it is comfortable. °a. You may drive when you no longer are taking prescription pain medication, you can comfortably wear a seatbelt, and you can safely maneuver your car and apply brakes. °b. RETURN TO WORK:  ______________________________________________________________________________________ °9. You should see your doctor in the office for a follow-up appointment approximately two weeks after your surgery.  Your doctor’s nurse will typically make your follow-up appointment when she calls you with your pathology report.  Expect your pathology report 3-4 business days after your surgery.  You may call to check if you do not hear from us after three days. °10. OTHER INSTRUCTIONS: _______________________________________________________________________________________________ _____________________________________________________________________________________________________________________________________ °_____________________________________________________________________________________________________________________________________ °_____________________________________________________________________________________________________________________________________ ° °WHEN TO CALL DR WAKEFIELD: °1. Fever over 101.0 °2. Nausea and/or vomiting. °3. Extreme swelling or bruising. °4. Continued bleeding from incision. °5. Increased pain, redness, or drainage from the incision. ° °The clinic staff is available to  answer your questions during regular business hours.  Please don’t hesitate to call and ask to speak to one of the nurses for   clinical concerns.  If you have a medical emergency, go to the nearest emergency room or call 911.  A surgeon from [facility] is always on call at the hospital. ° °For further questions, please visit [email or web address] ° °

## 2013-10-08 ENCOUNTER — Telehealth (INDEPENDENT_AMBULATORY_CARE_PROVIDER_SITE_OTHER): Payer: Self-pay | Admitting: General Surgery

## 2013-10-08 NOTE — Telephone Encounter (Signed)
Called pt and informed her that her surgical pathology came back benign.

## 2013-10-11 ENCOUNTER — Encounter: Payer: Self-pay | Admitting: Pulmonary Disease

## 2013-10-11 ENCOUNTER — Ambulatory Visit (INDEPENDENT_AMBULATORY_CARE_PROVIDER_SITE_OTHER): Payer: Medicare Other | Admitting: Pulmonary Disease

## 2013-10-11 VITALS — BP 130/88 | HR 70 | Temp 98.6°F | Ht 65.5 in | Wt 259.0 lb

## 2013-10-11 DIAGNOSIS — G4733 Obstructive sleep apnea (adult) (pediatric): Secondary | ICD-10-CM

## 2013-10-11 DIAGNOSIS — J45909 Unspecified asthma, uncomplicated: Secondary | ICD-10-CM | POA: Insufficient documentation

## 2013-10-11 DIAGNOSIS — R053 Chronic cough: Secondary | ICD-10-CM

## 2013-10-11 DIAGNOSIS — R05 Cough: Secondary | ICD-10-CM

## 2013-10-11 DIAGNOSIS — R059 Cough, unspecified: Secondary | ICD-10-CM

## 2013-10-11 NOTE — Assessment & Plan Note (Signed)
It is unclear whether the patient has asthma, but her current symptoms are not related to this. I would like to continue aggressively treating her upper airway cough, and at some point in may challenge her with discontinuation of her asthma medications to see how she responds.

## 2013-10-11 NOTE — Assessment & Plan Note (Signed)
The patient's chronic cough is clearly coming from her upper airway, and she feels that it is improved from the last visit. I do not think this is coming from her asthma, but rather from postnasal drip and reflux. I have stressed to her the importance of being compliant daily with her antihistamine and twice a day proton pump inhibitor.

## 2013-10-11 NOTE — Patient Instructions (Signed)
Will have your homecare company show you how to use the heated humidifier, and check out your machine.  Will also increase the pressure.   Work on [employer] loss Stay on your asthma medications for now, but you must take zyrtec 10mg  everyday  Take your omeprazole 40mg  TWICE a day everyday.   followup with me again in 47mos.

## 2013-10-11 NOTE — Assessment & Plan Note (Signed)
The patient feels that she is sleeping much better with CPAP, with increased daytime alertness. Her download shows only averaged compliance, but the patient tells me that it is not recording her actual usage. We'll therefore have her home care company check her machine. Will also increase the pressure setting since she is having some breakthrough events. I have encouraged her to work aggressively on weight loss.

## 2013-10-11 NOTE — Progress Notes (Signed)
   Subjective:    Patient ID: Angela Chase, female    DOB: [birth year], 68 y.o.   MRN: [id number]  HPI The patient comes in today for followup of multiple pulmonary issues. She has obstructive sleep apnea, and has been started on CPAP. She has seen a significant improvement in her sleep and daytime alertness, and her AHI has decreased to 6 by her recent download. She is going to need a higher pressure, and will use the automatic setting. The patient states that her cough is greatly improved, although she has stopped taking Zyrtec and twice a day proton pump inhibitor daily as I instructed her. She has continued on her asthma medications as instructed, but it remains unclear whether she even has asthma.   Review of Systems  Constitutional: Negative for fever and unexpected weight change.  HENT: Negative for congestion, dental problem, ear pain, nosebleeds, postnasal drip, rhinorrhea, sinus pressure, sneezing, sore throat and trouble swallowing.   Eyes: Negative for redness and itching.  Respiratory: Positive for cough. Negative for chest tightness, shortness of breath and wheezing.   Cardiovascular: Negative for palpitations and leg swelling.  Gastrointestinal: Negative for nausea and vomiting.  Genitourinary: Negative for dysuria.  Musculoskeletal: Negative for joint swelling.  Skin: Negative for rash.  Neurological: Negative for headaches.  Hematological: Does not bruise/bleed easily.  Psychiatric/Behavioral: Negative for dysphoric mood. The patient is not nervous/anxious.        Objective:   Physical Exam Morbidly obese female in no acute distress Nose without purulence or discharge noted No skin breakdown or pressure necrosis from the CPAP mask Neck without lymphadenopathy or thyromegaly Chest totally clear to auscultation, no wheezing Cardiac exam with regular rate and rhythm Lower extremities with 1+ edema, no cyanosis Alert and oriented, moves all 4 extremities.         Assessment & Plan:

## 2013-10-12 ENCOUNTER — Encounter (INDEPENDENT_AMBULATORY_CARE_PROVIDER_SITE_OTHER): Payer: Medicare Other | Admitting: General Surgery

## 2013-10-15 ENCOUNTER — Telehealth: Payer: Self-pay | Admitting: Family Medicine

## 2013-10-15 NOTE — Telephone Encounter (Signed)
Pls let pt know I received labs recently from outside facility, and I  recommend following low fat , reduced carb diet.and continuing to work on weight loss TG were slightly elevated at 218, but she was not fasting, ALT and alk phos were both slightly elevated, but had been normal when I  Checked them earlier this year I trust that she is recovering well from her surgery,I tried to call but would have had to leave a message, pls let her know

## 2013-10-18 NOTE — Telephone Encounter (Signed)
Letter sent reviewing results.

## 2013-10-26 ENCOUNTER — Other Ambulatory Visit (HOSPITAL_COMMUNITY)
Admission: RE | Admit: 2013-10-26 | Discharge: 2013-10-26 | Disposition: A | Discharge status: Home / Self Care | Payer: Medicare Other | Source: Ambulatory Visit | Attending: Family Medicine | Admitting: Family Medicine

## 2013-10-26 ENCOUNTER — Encounter (INDEPENDENT_AMBULATORY_CARE_PROVIDER_SITE_OTHER): Payer: Self-pay | Admitting: General Surgery

## 2013-10-26 ENCOUNTER — Encounter (INDEPENDENT_AMBULATORY_CARE_PROVIDER_SITE_OTHER): Payer: Medicare Other | Admitting: General Surgery

## 2013-10-26 ENCOUNTER — Ambulatory Visit (INDEPENDENT_AMBULATORY_CARE_PROVIDER_SITE_OTHER): Payer: Medicare Other | Admitting: Family Medicine

## 2013-10-26 ENCOUNTER — Ambulatory Visit (INDEPENDENT_AMBULATORY_CARE_PROVIDER_SITE_OTHER): Payer: Medicare Other | Admitting: General Surgery

## 2013-10-26 ENCOUNTER — Encounter: Payer: Self-pay | Admitting: Family Medicine

## 2013-10-26 VITALS — BP 122/80 | HR 92 | Resp 18 | Ht 65.5 in | Wt 247.1 lb

## 2013-10-26 VITALS — BP 132/80 | HR 82 | Temp 97.5°F | Ht 65.0 in | Wt 245.0 lb

## 2013-10-26 DIAGNOSIS — Z0000 Encounter for general adult medical examination without abnormal findings: Secondary | ICD-10-CM | POA: Insufficient documentation

## 2013-10-26 DIAGNOSIS — Z01419 Encounter for gynecological examination (general) (routine) without abnormal findings: Secondary | ICD-10-CM | POA: Insufficient documentation

## 2013-10-26 DIAGNOSIS — Z6841 Body Mass Index (BMI) 40.0 and over, adult: Secondary | ICD-10-CM

## 2013-10-26 DIAGNOSIS — E785 Hyperlipidemia, unspecified: Secondary | ICD-10-CM

## 2013-10-26 DIAGNOSIS — Z124 Encounter for screening for malignant neoplasm of cervix: Secondary | ICD-10-CM

## 2013-10-26 DIAGNOSIS — Z09 Encounter for follow-up examination after completed treatment for conditions other than malignant neoplasm: Secondary | ICD-10-CM

## 2013-10-26 DIAGNOSIS — Z1211 Encounter for screening for malignant neoplasm of colon: Secondary | ICD-10-CM

## 2013-10-26 DIAGNOSIS — E559 Vitamin D deficiency, unspecified: Secondary | ICD-10-CM

## 2013-10-26 LAB — POC HEMOCCULT BLD/STL (OFFICE/1-CARD/DIAGNOSTIC): FECAL OCCULT BLD: NEGATIVE

## 2013-10-26 MED ORDER — PHENTERMINE HCL 37.5 MG PO TABS: ORAL_TABLET | ORAL | Status: DC

## 2013-10-26 NOTE — Patient Instructions (Addendum)
F/u in 4 month, call if you need me before  I am thankful that your breast surgery went very well, and you do not have breast cancer. I will also let Dr Donne Hazel know how very highly you spoke of his care  Congrats on weight loss,please keep it up!  Fasting lipid, chem 7 , Vit D in 4 month, before next visit  You are referred to Dr Laural Golden for screening colonoscopy, impt, to get this done  Continue phentermine as before  Pls let me knwo by calling when and if you want to see orthopedics F/u in 4 months, call if you need me before  Continue to commit to healthy lifestyle to promote improved health and weight loss  Screening colonoscopy still needed, you are referred to Dr Laural Golden

## 2013-10-26 NOTE — Progress Notes (Signed)
Subjective:     Patient ID: Angela Chase, female   DOB: 10-Jan-1946, 68 y.o.   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  She has done well and returns today without complaints.    Review of Systems     Objective:   Physical Exam Healing left breast incision without infection    Assessment:     S/p seed guided excision      Plan:     She can return to full activity. I will plan on seeing her back as needed. She knows that she needs to continue her normal screening including annual mammography, monthly self exams as well as annual clinical exams.

## 2013-10-26 NOTE — Patient Instructions (Signed)

## 2013-10-26 NOTE — Progress Notes (Signed)
   Subjective:    Patient ID: Angela Chase, female    DOB: Sep 09, 1945, 68 y.o.   MRN: [id number]  HPI Pt in for her annual physical exam. Continues to have significant knee pain but no interest in intervention at this time Recently had left breast surgery healing well, benign disease Doing very well with weight loss Needs colonoscopy, and immunization is up to date   Review of Systems See HPI     Objective:   Physical Exam  BP 122/80  Pulse 92  Resp 18  Ht 5' 5.5" (1.664 m)  Wt 247 lb 1.3 oz (112.075 kg)  BMI 40.48 kg/m2  SpO2 96% Pleasant obese female, alert and oriented x 3, in no cardio-pulmonary distress. Afebrile. HEENT No facial trauma or asymetry. Sinuses non tender.  EOMI, PERTL, .  External ears normal, tympanic membranes clear. Oropharynx moist, no exudate, good dentition. Neck: supple, no adenopathy,JVD or thyromegaly.No bruits.  Chest: Clear to ascultation bilaterally.No crackles or wheezes. Non tender to palpation  Breast: Right breast,no masses or lumps. No tenderness. No nipple discharge or inversion.Left breast tender to palpation on upper outer quadrant, pt currently has healing scar from recent lumpectomy, no erythema or drainage no sign of infection at wound site No axillary or supraclavicular adenopathy  Cardiovascular system; Heart sounds normal,  S1 and  S2 ,no S3.  No murmur, or thrill. Apical beat not displaced Peripheral pulses normal.  Abdomen: Soft, non tender, no organomegaly or masses. No bruits. Bowel sounds normal. No guarding, tenderness or rebound.  Rectal:  Normal sphincter tone. No mass.No rectal masses.  Guaiac negative stool.  GU: External genitalia normal female genitalia , female distribution of hair. No lesions. Urethral meatus normal in size, no  Prolapse, no lesions visibly  Present. Bladder non tender. Vagina pink and moist , with no visible lesions , discharge present . Adequate pelvic support no   cystocele or rectocele noted Cervix pink and appears healthy, no lesions or ulcerations noted, no discharge noted from os Uterus normal size, no adnexal masses, no cervical motion or adnexal tenderness.   Musculoskeletal exam:  decreased  ROM of spine, hips , shoulders and knees.  deformity ,swelling and  crepitus noted on both knees left more than right No muscle wasting or atrophy.   Neurologic: Cranial nerves 2 to 12 intact. Power, tone ,sensation and reflexes normal throughout. No disturbance in gait. No tremor.  Skin: Intact, no ulceration, erythema , scaling or rash noted. Pigmentation normal throughout  Psych; Normal mood and affect. Judgement and concentration normal       Assessment & Plan:  Encounter for annual physical exam Annual exam as documented. Counseling done  re healthy lifestyle involving commitment to 150 minutes exercise per week, heart healthy diet, and attaining healthy weight.The importance of adequate sleep also discussed. Regular seat belt use and safe storage  of firearms if patient has them, is also discussed. Changes in health habits are decided on by the patient with goals and time frames  set for achieving them. Immunization and cancer screening needs are specifically addressed at this visit.

## 2013-10-27 LAB — CYTOLOGY - PAP

## 2013-11-02 ENCOUNTER — Encounter (INDEPENDENT_AMBULATORY_CARE_PROVIDER_SITE_OTHER): Payer: Self-pay | Admitting: *Deleted

## 2013-11-10 ENCOUNTER — Encounter: Payer: Self-pay | Admitting: Pulmonary Disease

## 2013-11-18 ENCOUNTER — Ambulatory Visit (INDEPENDENT_AMBULATORY_CARE_PROVIDER_SITE_OTHER): Payer: Medicare Other | Admitting: Otolaryngology

## 2013-11-18 ENCOUNTER — Other Ambulatory Visit: Payer: Self-pay | Admitting: Family Medicine

## 2013-11-18 DIAGNOSIS — J343 Hypertrophy of nasal turbinates: Secondary | ICD-10-CM

## 2013-11-18 DIAGNOSIS — J310 Chronic rhinitis: Secondary | ICD-10-CM

## 2013-11-19 ENCOUNTER — Telehealth: Payer: Self-pay | Admitting: Family Medicine

## 2013-11-19 MED ORDER — VITAMIN D (ERGOCALCIFEROL) 1.25 MG (50000 UNIT) PO CAPS: 50000.0000 [IU] | ORAL_CAPSULE | ORAL | Status: DC

## 2013-11-19 NOTE — Telephone Encounter (Signed)
Med sent.

## 2013-11-21 NOTE — Assessment & Plan Note (Signed)
Annual exam as documented. Counseling done  re healthy lifestyle involving commitment to 150 minutes exercise per week, heart healthy diet, and attaining healthy weight.The importance of adequate sleep also discussed. Regular seat belt use and safe storage  of firearms if patient has them, is also discussed. Changes in health habits are decided on by the patient with goals and time frames  set for achieving them. Immunization and cancer screening needs are specifically addressed at this visit.  

## 2013-12-20 ENCOUNTER — Telehealth: Payer: Self-pay | Admitting: Family Medicine

## 2013-12-20 MED ORDER — POTASSIUM CHLORIDE CRYS ER 20 MEQ PO TBCR: 20.0000 meq | EXTENDED_RELEASE_TABLET | Freq: Every day | ORAL | Status: DC | PRN

## 2013-12-20 MED ORDER — FUROSEMIDE 20 MG PO TABS: 20.0000 mg | ORAL_TABLET | Freq: Every day | ORAL | Status: DC | PRN

## 2013-12-20 NOTE — Telephone Encounter (Signed)
States that she does not have any increase in sob and no signs of heart failure.  Will collect rx.

## 2013-12-20 NOTE — Telephone Encounter (Signed)
Is it ok to refill? 

## 2013-12-20 NOTE — Telephone Encounter (Signed)
Patient in to office and notified.

## 2013-12-20 NOTE — Addendum Note (Signed)
Addended by: Denman George B on: 12/20/2013 04:01 PM   Modules accepted: Orders, Medications

## 2013-12-20 NOTE — Telephone Encounter (Signed)
Needs potassium pill and water pills like Dr gave her with 12 pills like she had gave her before and they worked for her feet are swelling and very painful wants to know does she need to come in please call back

## 2013-12-20 NOTE — Telephone Encounter (Signed)
pls advise her to reduce salt intake , send in lasix 20mg  one daily as needed for swelling # 10 refill one and potassium 55meq one daily on the days that she take her lasix  Pls specifically ask about waking up SOB and needing to use more pillows under her head , unable to lie flat, if either of those are positive , pls sched appt this week(symptoms of heart failure)

## 2014-01-05 ENCOUNTER — Telehealth: Payer: Self-pay | Admitting: Family Medicine

## 2014-01-05 MED ORDER — POTASSIUM CHLORIDE CRYS ER 20 MEQ PO TBCR: 20.0000 meq | EXTENDED_RELEASE_TABLET | Freq: Two times a day (BID) | ORAL | Status: DC

## 2014-01-05 MED ORDER — FUROSEMIDE 40 MG PO TABS: 40.0000 mg | ORAL_TABLET | Freq: Every day | ORAL | Status: DC

## 2014-01-05 NOTE — Addendum Note (Signed)
Addended by: Tula Nakayama E on: 01/05/2014 12:28 PM   Modules accepted: Orders

## 2014-01-05 NOTE — Telephone Encounter (Signed)
Lasix and potassium were entered since 8/24, pls first call pharmacyn to zxee if collected , if not re send and let pt know pls If they were will need to change dose let me know

## 2014-01-05 NOTE — Telephone Encounter (Signed)
Patient did fill rx in August per pharmacy

## 2014-01-05 NOTE — Telephone Encounter (Signed)
Needs appt scheduled for Ov early October in the interim I have sent in higher dose of both lasix and potassium pls let her know

## 2014-01-05 NOTE — Telephone Encounter (Signed)
Please advise if this is appropriate to refill.

## 2014-01-05 NOTE — Telephone Encounter (Signed)
Pt aware.

## 2014-01-24 ENCOUNTER — Telehealth: Payer: Self-pay | Admitting: Pulmonary Disease

## 2014-01-24 NOTE — Telephone Encounter (Signed)
Called spoke Angela Chase from [place]. She reports pt has been non compliant with CPAP. Pt told them on 6/10 pt had surgery d/t her breast cancer and has been recovering from this. [facility] wants to know if [facility] will write a letter stating pt needs to keep CPAP and reason why she needs this to help [facility] try to get pt insurance to keep from taking this. Please advise thanks

## 2014-01-26 ENCOUNTER — Telehealth: Payer: Self-pay | Admitting: Family Medicine

## 2014-01-26 NOTE — Telephone Encounter (Signed)
noted 

## 2014-01-26 NOTE — Telephone Encounter (Signed)
Spoke with the pt  She states that she uses CPAP "sometimes"  She states that she was not able to use in June due to breast surgery and being out of town  I asked multiple times for her to tell me how often she uses machine, and she never gave a straightforward answer  When asked why she is unable to use consistently, she states "because I didn't think it was working properly, but I had it checked and it was" She states that she will try to use machine every night from here on our and is to call with any problems  Will forward to [facility] so that he is aware

## 2014-01-26 NOTE — Telephone Encounter (Signed)
Pt returned call [phone]

## 2014-01-26 NOTE — Telephone Encounter (Signed)
Return call from patient documented in incorrect phone note Will route to Dr Moshe Cipro

## 2014-01-26 NOTE — Telephone Encounter (Signed)
Needs to have Doc responsible for treating sleep apnea prescribe  Pls let me know who is Parke Poisson also [employer]

## 2014-01-26 NOTE — Telephone Encounter (Signed)
Pt returned call.  Holly D Pryor ° °

## 2014-01-26 NOTE — Telephone Encounter (Signed)
Patient states she needs her CPAP machine. Already has one but it needs to be set back up because she has trouble at night. Originally ordered by Dr Gwenette Greet in [place] but she no longer sees him. Got machine from [facility] [phone]. Hasn't been too long ago had a sleep study at [facility]. Please advise

## 2014-01-26 NOTE — Telephone Encounter (Signed)
Looks like patient called Clance's office and had discussion with them about the matter. Any letter needed for insurance purposes should come from pulmonary. Patient was advised on CPAP use by his office

## 2014-01-26 NOTE — Telephone Encounter (Signed)
Zero compliance since CPAP delivered. Per Maudie Mercury the patient has used ONLY 72mins.  Insurance is not wanting to cover machine and has advised [facility] to pick it up Maudie Mercury states that she does not want to pick up machine but insurance is refusing to cover CPAP at all d/t non-compliance. Maudie Mercury states that the only way around this is to write a letter to her insurance stating that importance and need for CPAP.   I have called and LM with patient to contact our office to let us know what happened with CPAP use

## 2014-01-26 NOTE — Telephone Encounter (Signed)
The pt's insurance does not take away the pt's machine, her DME DOES!! Why should I write a letter saying she needs cpap, when I have already ordered it?? Let them know if they take her cpap away, I will refer to different dme!!

## 2014-01-26 NOTE — Telephone Encounter (Signed)
Per the 9.30.15 phone note under PCP: Call [place] at 01/26/2014 2:12 PM     Status: Signed        Pt returned call [phone]        Jaci Carrel [facility], LPN at 5/46/5035 46:56 PM     Status: Signed        Patient states she needs her CPAP machine. Already has one but it needs to be set back up because she has trouble at night. Originally ordered by Dr Gwenette Greet in [place] but she no longer sees him. Got machine from [facility] [phone]. Hasn't been too long ago had a sleep study at [facility]. Please advise    LMOM TCB x1 for pt Per the 6.15.15 ov w/ [facility], pt reported she is wearing her CPAP every night x7hrs per night but downloads are only recording that she wears her CPAP a fraction of this time.

## 2014-02-09 ENCOUNTER — Encounter (INDEPENDENT_AMBULATORY_CARE_PROVIDER_SITE_OTHER): Payer: Self-pay

## 2014-02-09 ENCOUNTER — Ambulatory Visit (INDEPENDENT_AMBULATORY_CARE_PROVIDER_SITE_OTHER): Payer: Medicare Other | Admitting: Pulmonary Disease

## 2014-02-09 ENCOUNTER — Encounter: Payer: Self-pay | Admitting: Pulmonary Disease

## 2014-02-09 VITALS — BP 124/72 | HR 68 | Temp 98.0°F | Ht 65.5 in | Wt 251.8 lb

## 2014-02-09 DIAGNOSIS — J4520 Mild intermittent asthma, uncomplicated: Secondary | ICD-10-CM

## 2014-02-09 DIAGNOSIS — G4733 Obstructive sleep apnea (adult) (pediatric): Secondary | ICD-10-CM

## 2014-02-09 NOTE — Progress Notes (Signed)
   Subjective:    Patient ID: Angela Chase, female    DOB: Apr 04, 1946, 68 y.o.   MRN: [id number]  HPI The patient comes in today for followup of her obstructive sleep apnea and questionable history of asthma. Her download on her CPAP device does not show compliance, but the patient claims the machine is not working properly. She does see a benefit whenever she is wearing the device consistently and it is working properly. The patient also continues to have the upper airway issues with postnasal drip and hoarseness. She apparently has been seen by otolaryngology. She feels that she is having a lot of allergy symptoms, and I've asked her to try a daily antihistamine. She may need to get back with her allergist. She is staying on her maintenance inhaler regimen, but again it is unclear if she even has asthma.   Review of Systems  Constitutional: Negative for fever and unexpected weight change.  HENT: Positive for congestion and postnasal drip. Negative for dental problem, ear pain, nosebleeds, rhinorrhea, sinus pressure, sneezing, sore throat and trouble swallowing.   Eyes: Negative for redness and itching.  Respiratory: Positive for cough, shortness of breath and wheezing. Negative for chest tightness.   Cardiovascular: Negative for palpitations and leg swelling.  Gastrointestinal: Negative for nausea and vomiting.  Genitourinary: Negative for dysuria.  Musculoskeletal: Negative for joint swelling.  Skin: Negative for rash.  Neurological: Negative for headaches.  Hematological: Does not bruise/bleed easily.  Psychiatric/Behavioral: Negative for dysphoric mood. The patient is not nervous/anxious.        Objective:   Physical Exam Overweight female in no acute distress Nose without purulence or discharge noted Neck without lymphadenopathy or thyromegaly No skin breakdown or pressure necrosis from the CPAP mask Chest is totally clear to auscultation, no wheezing Cardiac exam with  regular rate and rhythm Mild ankle edema, no cyanosis Alert and oriented, moves all 4 extremities.       Assessment & Plan:

## 2014-02-09 NOTE — Assessment & Plan Note (Signed)
The patient carries a diagnosis of asthma, however spirometry does not show airflow obstruction. It is unclear to me whether she even has asthma, and I have wanted to try taking her off her maintenance medication to see if she remained stable. Right now she is having a lot of allergy symptoms, and I would like to try this at a time when she is not having as many issues.

## 2014-02-09 NOTE — Patient Instructions (Signed)
Will refer you back to your home care company to check your cpap machine. Try taking zyrtec 10mg  at bedtime to see if helps allergy symptoms and upper airway noise If you feel your allergies are not adequately controlled, will need to consider seeing your allergist again. followup with me again in 48mos.

## 2014-02-09 NOTE — Assessment & Plan Note (Signed)
The patient's download shows noncompliance with her device, but she claims the machine is not functioning properly. She also feels it is not recording her actual usage accurately either. We'll get her referred back to her home care company to check her machine. I have stressed to her the need to stay on this consistently because of the severity of her sleep apnea. I've also encouraged her work aggressively on weight loss.

## 2014-02-22 ENCOUNTER — Emergency Department (HOSPITAL_COMMUNITY): Payer: Medicare Other

## 2014-02-22 ENCOUNTER — Inpatient Hospital Stay (HOSPITAL_COMMUNITY)
Admission: EM | Admit: 2014-02-22 | Discharge: 2014-02-25 | DRG: 153 | Disposition: A | Discharge status: Home / Self Care | Payer: Medicare Other | Attending: Internal Medicine | Admitting: Internal Medicine

## 2014-02-22 ENCOUNTER — Encounter (HOSPITAL_COMMUNITY): Payer: Self-pay | Admitting: Emergency Medicine

## 2014-02-22 DIAGNOSIS — J029 Acute pharyngitis, unspecified: Secondary | ICD-10-CM

## 2014-02-22 DIAGNOSIS — R05 Cough: Secondary | ICD-10-CM

## 2014-02-22 DIAGNOSIS — J020 Streptococcal pharyngitis: Secondary | ICD-10-CM | POA: Diagnosis not present

## 2014-02-22 DIAGNOSIS — G4733 Obstructive sleep apnea (adult) (pediatric): Secondary | ICD-10-CM | POA: Diagnosis present

## 2014-02-22 DIAGNOSIS — Z6837 Body mass index (BMI) 37.0-37.9, adult: Secondary | ICD-10-CM

## 2014-02-22 DIAGNOSIS — E669 Obesity, unspecified: Secondary | ICD-10-CM | POA: Diagnosis present

## 2014-02-22 DIAGNOSIS — R059 Cough, unspecified: Secondary | ICD-10-CM

## 2014-02-22 DIAGNOSIS — R600 Localized edema: Secondary | ICD-10-CM | POA: Diagnosis present

## 2014-02-22 DIAGNOSIS — J45909 Unspecified asthma, uncomplicated: Secondary | ICD-10-CM | POA: Diagnosis present

## 2014-02-22 DIAGNOSIS — F419 Anxiety disorder, unspecified: Secondary | ICD-10-CM | POA: Diagnosis present

## 2014-02-22 DIAGNOSIS — R071 Chest pain on breathing: Secondary | ICD-10-CM | POA: Diagnosis present

## 2014-02-22 DIAGNOSIS — R490 Dysphonia: Secondary | ICD-10-CM | POA: Diagnosis present

## 2014-02-22 DIAGNOSIS — R0781 Pleurodynia: Secondary | ICD-10-CM | POA: Diagnosis present

## 2014-02-22 DIAGNOSIS — F329 Major depressive disorder, single episode, unspecified: Secondary | ICD-10-CM | POA: Diagnosis present

## 2014-02-22 DIAGNOSIS — R609 Edema, unspecified: Secondary | ICD-10-CM | POA: Diagnosis present

## 2014-02-22 DIAGNOSIS — Z9989 Dependence on other enabling machines and devices: Secondary | ICD-10-CM

## 2014-02-22 DIAGNOSIS — K219 Gastro-esophageal reflux disease without esophagitis: Secondary | ICD-10-CM | POA: Diagnosis present

## 2014-02-22 DIAGNOSIS — M1990 Unspecified osteoarthritis, unspecified site: Secondary | ICD-10-CM | POA: Diagnosis present

## 2014-02-22 DIAGNOSIS — E785 Hyperlipidemia, unspecified: Secondary | ICD-10-CM | POA: Diagnosis present

## 2014-02-22 DIAGNOSIS — R590 Localized enlarged lymph nodes: Secondary | ICD-10-CM | POA: Diagnosis present

## 2014-02-22 LAB — RAPID STREP SCREEN (MED CTR MEBANE ONLY): STREPTOCOCCUS, GROUP A SCREEN (DIRECT): POSITIVE — AB

## 2014-02-22 MED ORDER — IPRATROPIUM-ALBUTEROL 0.5-2.5 (3) MG/3ML IN SOLN
3.0000 mL | Freq: Once | RESPIRATORY_TRACT | Status: AC
  Administered 2014-02-22: 3 mL via RESPIRATORY_TRACT
  Filled 2014-02-22: qty 3

## 2014-02-22 MED ORDER — DEXAMETHASONE SODIUM PHOSPHATE 4 MG/ML IJ SOLN
10.0000 mg | Freq: Once | INTRAMUSCULAR | Status: AC
  Administered 2014-02-22: 10 mg via INTRAVENOUS
  Filled 2014-02-22: qty 3

## 2014-02-22 MED ORDER — PENICILLIN G BENZATHINE 1200000 UNIT/2ML IM SUSP
1.2000 10*6.[IU] | Freq: Once | INTRAMUSCULAR | Status: AC
  Administered 2014-02-22: 1.2 10*6.[IU] via INTRAMUSCULAR
  Filled 2014-02-22: qty 2

## 2014-02-22 NOTE — ED Notes (Signed)
Sob with productive cough x 1 week, fever and chills

## 2014-02-23 ENCOUNTER — Emergency Department (HOSPITAL_COMMUNITY): Payer: Medicare Other

## 2014-02-23 DIAGNOSIS — F419 Anxiety disorder, unspecified: Secondary | ICD-10-CM | POA: Diagnosis not present

## 2014-02-23 DIAGNOSIS — Z8719 Personal history of other diseases of the digestive system: Secondary | ICD-10-CM | POA: Diagnosis not present

## 2014-02-23 DIAGNOSIS — E669 Obesity, unspecified: Secondary | ICD-10-CM | POA: Diagnosis present

## 2014-02-23 DIAGNOSIS — J020 Streptococcal pharyngitis: Secondary | ICD-10-CM | POA: Diagnosis not present

## 2014-02-23 DIAGNOSIS — R490 Dysphonia: Secondary | ICD-10-CM | POA: Diagnosis present

## 2014-02-23 DIAGNOSIS — J45909 Unspecified asthma, uncomplicated: Secondary | ICD-10-CM | POA: Diagnosis present

## 2014-02-23 DIAGNOSIS — E785 Hyperlipidemia, unspecified: Secondary | ICD-10-CM | POA: Diagnosis present

## 2014-02-23 DIAGNOSIS — Z9989 Dependence on other enabling machines and devices: Secondary | ICD-10-CM | POA: Diagnosis not present

## 2014-02-23 DIAGNOSIS — K219 Gastro-esophageal reflux disease without esophagitis: Secondary | ICD-10-CM | POA: Diagnosis present

## 2014-02-23 DIAGNOSIS — F329 Major depressive disorder, single episode, unspecified: Secondary | ICD-10-CM | POA: Diagnosis not present

## 2014-02-23 DIAGNOSIS — Z6837 Body mass index (BMI) 37.0-37.9, adult: Secondary | ICD-10-CM | POA: Diagnosis not present

## 2014-02-23 DIAGNOSIS — R05 Cough: Secondary | ICD-10-CM

## 2014-02-23 DIAGNOSIS — Z79899 Other long term (current) drug therapy: Secondary | ICD-10-CM | POA: Diagnosis not present

## 2014-02-23 DIAGNOSIS — R590 Localized enlarged lymph nodes: Secondary | ICD-10-CM | POA: Diagnosis present

## 2014-02-23 DIAGNOSIS — M1990 Unspecified osteoarthritis, unspecified site: Secondary | ICD-10-CM | POA: Diagnosis present

## 2014-02-23 DIAGNOSIS — R071 Chest pain on breathing: Secondary | ICD-10-CM | POA: Diagnosis present

## 2014-02-23 DIAGNOSIS — Z8639 Personal history of other endocrine, nutritional and metabolic disease: Secondary | ICD-10-CM | POA: Diagnosis not present

## 2014-02-23 DIAGNOSIS — G4733 Obstructive sleep apnea (adult) (pediatric): Secondary | ICD-10-CM | POA: Diagnosis not present

## 2014-02-23 DIAGNOSIS — M542 Cervicalgia: Secondary | ICD-10-CM | POA: Diagnosis not present

## 2014-02-23 DIAGNOSIS — J45901 Unspecified asthma with (acute) exacerbation: Secondary | ICD-10-CM | POA: Diagnosis not present

## 2014-02-23 DIAGNOSIS — R0781 Pleurodynia: Secondary | ICD-10-CM | POA: Diagnosis present

## 2014-02-23 DIAGNOSIS — R609 Edema, unspecified: Secondary | ICD-10-CM | POA: Diagnosis present

## 2014-02-23 LAB — CBC WITH DIFFERENTIAL/PLATELET
BASOS ABS: 0 10*3/uL (ref 0.0–0.1)
Basophils Relative: 0 % (ref 0–1)
EOS ABS: 0 10*3/uL (ref 0.0–0.7)
EOS PCT: 0 % (ref 0–5)
HCT: 35.3 % — ABNORMAL LOW (ref 36.0–46.0)
Hemoglobin: 12.6 g/dL (ref 12.0–15.0)
Lymphocytes Relative: 8 % — ABNORMAL LOW (ref 12–46)
Lymphs Abs: 2 10*3/uL (ref 0.7–4.0)
MCH: 25.9 pg — AB (ref 26.0–34.0)
MCHC: 35.7 g/dL (ref 30.0–36.0)
MCV: 72.5 fL — ABNORMAL LOW (ref 78.0–100.0)
MONO ABS: 2 10*3/uL — AB (ref 0.1–1.0)
Monocytes Relative: 8 % (ref 3–12)
NEUTROS PCT: 84 % — AB (ref 43–77)
Neutro Abs: 20.7 10*3/uL — ABNORMAL HIGH (ref 1.7–7.7)
PLATELETS: 241 10*3/uL (ref 150–400)
RBC: 4.87 MIL/uL (ref 3.87–5.11)
RDW: 14.5 % (ref 11.5–15.5)
WBC: 24.7 10*3/uL — ABNORMAL HIGH (ref 4.0–10.5)

## 2014-02-23 LAB — CBC
HCT: 36 % (ref 36.0–46.0)
Hemoglobin: 12.7 g/dL (ref 12.0–15.0)
MCH: 25.7 pg — AB (ref 26.0–34.0)
MCHC: 35.3 g/dL (ref 30.0–36.0)
MCV: 72.7 fL — ABNORMAL LOW (ref 78.0–100.0)
PLATELETS: 229 10*3/uL (ref 150–400)
RBC: 4.95 MIL/uL (ref 3.87–5.11)
RDW: 14.5 % (ref 11.5–15.5)
WBC: 33.1 10*3/uL — ABNORMAL HIGH (ref 4.0–10.5)

## 2014-02-23 LAB — COMPREHENSIVE METABOLIC PANEL
ALT: 94 U/L — ABNORMAL HIGH (ref 0–35)
AST: 55 U/L — ABNORMAL HIGH (ref 0–37)
Albumin: 2.8 g/dL — ABNORMAL LOW (ref 3.5–5.2)
Alkaline Phosphatase: 233 U/L — ABNORMAL HIGH (ref 39–117)
Anion gap: 16 — ABNORMAL HIGH (ref 5–15)
BUN: 6 mg/dL (ref 6–23)
CALCIUM: 8.9 mg/dL (ref 8.4–10.5)
CO2: 22 meq/L (ref 19–32)
Chloride: 98 mEq/L (ref 96–112)
Creatinine, Ser: 0.69 mg/dL (ref 0.50–1.10)
GFR calc Af Amer: >90 mL/min (ref >90)
GFR calc non Af Amer: 89 mL/min — ABNORMAL LOW (ref >90)
Glucose, Bld: 140 mg/dL — ABNORMAL HIGH (ref 70–99)
Potassium: 3.7 mEq/L (ref 3.7–5.3)
Sodium: 136 mEq/L — ABNORMAL LOW (ref 137–147)
Total Bilirubin: 1.5 mg/dL — ABNORMAL HIGH (ref 0.3–1.2)
Total Protein: 8 g/dL (ref 6.0–8.3)

## 2014-02-23 LAB — BASIC METABOLIC PANEL
ANION GAP: 15 (ref 5–15)
BUN: 7 mg/dL (ref 6–23)
CALCIUM: 9.3 mg/dL (ref 8.4–10.5)
CO2: 22 meq/L (ref 19–32)
CREATININE: 0.71 mg/dL (ref 0.50–1.10)
Chloride: 96 mEq/L (ref 96–112)
GFR calc Af Amer: >90 mL/min (ref >90)
GFR, EST NON AFRICAN AMERICAN: 88 mL/min — AB (ref >90)
Glucose, Bld: 129 mg/dL — ABNORMAL HIGH (ref 70–99)
Potassium: 3.8 mEq/L (ref 3.7–5.3)
Sodium: 133 mEq/L — ABNORMAL LOW (ref 137–147)

## 2014-02-23 LAB — GLUCOSE, CAPILLARY
Glucose-Capillary: 152 mg/dL — ABNORMAL HIGH (ref 70–99)
Glucose-Capillary: 124 mg/dL — ABNORMAL HIGH (ref 70–99)
Glucose-Capillary: 153 mg/dL — ABNORMAL HIGH (ref 70–99)

## 2014-02-23 LAB — MRSA PCR SCREENING: MRSA by PCR: NEGATIVE

## 2014-02-23 LAB — LACTIC ACID, PLASMA: Lactic Acid, Venous: 1.6 mmol/L (ref 0.5–2.2)

## 2014-02-23 MED ORDER — ALBUTEROL SULFATE (2.5 MG/3ML) 0.083% IN NEBU
2.5000 mg | INHALATION_SOLUTION | RESPIRATORY_TRACT | Status: DC | PRN
  Administered 2014-02-23: 2.5 mg via RESPIRATORY_TRACT
  Filled 2014-02-23: qty 3

## 2014-02-23 MED ORDER — KETOROLAC TROMETHAMINE 15 MG/ML IJ SOLN
15.0000 mg | Freq: Three times a day (TID) | INTRAMUSCULAR | Status: DC | PRN
  Administered 2014-02-23: 15 mg via INTRAVENOUS
  Filled 2014-02-23 (×3): qty 1

## 2014-02-23 MED ORDER — FLUOXETINE HCL 20 MG PO CAPS
20.0000 mg | ORAL_CAPSULE | Freq: Every day | ORAL | Status: DC
  Administered 2014-02-24 – 2014-02-25 (×2): 20 mg via ORAL
  Filled 2014-02-23 (×3): qty 1

## 2014-02-23 MED ORDER — ONDANSETRON HCL 4 MG/2ML IJ SOLN: 4.0000 mg | Freq: Four times a day (QID) | INTRAMUSCULAR | Status: DC | PRN

## 2014-02-23 MED ORDER — CLINDAMYCIN PHOSPHATE 600 MG/50ML IV SOLN
600.0000 mg | Freq: Once | INTRAVENOUS | Status: AC
  Administered 2014-02-23: 600 mg via INTRAVENOUS
  Filled 2014-02-23: qty 50

## 2014-02-23 MED ORDER — METHYLPREDNISOLONE SODIUM SUCC 125 MG IJ SOLR
60.0000 mg | Freq: Every day | INTRAMUSCULAR | Status: DC
  Administered 2014-02-23: 60 mg via INTRAVENOUS
  Filled 2014-02-23 (×2): qty 0.96

## 2014-02-23 MED ORDER — ONDANSETRON HCL 4 MG PO TABS: 4.0000 mg | ORAL_TABLET | Freq: Four times a day (QID) | ORAL | Status: DC | PRN

## 2014-02-23 MED ORDER — ACETAMINOPHEN 500 MG PO TABS
1000.0000 mg | ORAL_TABLET | Freq: Once | ORAL | Status: AC
  Administered 2014-02-23: 1000 mg via ORAL
  Filled 2014-02-23: qty 2

## 2014-02-23 MED ORDER — KETOROLAC TROMETHAMINE 15 MG/ML IJ SOLN
15.0000 mg | Freq: Once | INTRAMUSCULAR | Status: AC
  Administered 2014-02-23: 15 mg via INTRAVENOUS
  Filled 2014-02-23: qty 1

## 2014-02-23 MED ORDER — SODIUM CHLORIDE 0.9 % IV BOLUS (SEPSIS)
1000.0000 mL | Freq: Once | INTRAVENOUS | Status: AC
  Administered 2014-02-23: 1000 mL via INTRAVENOUS

## 2014-02-23 MED ORDER — ENOXAPARIN SODIUM 40 MG/0.4ML ~~LOC~~ SOLN
40.0000 mg | SUBCUTANEOUS | Status: DC
  Administered 2014-02-23 – 2014-02-24 (×2): 40 mg via SUBCUTANEOUS
  Filled 2014-02-23 (×3): qty 0.4

## 2014-02-23 MED ORDER — CETYLPYRIDINIUM CHLORIDE 0.05 % MT LIQD
7.0000 mL | Freq: Two times a day (BID) | OROMUCOSAL | Status: DC
  Administered 2014-02-23 – 2014-02-25 (×5): 7 mL via OROMUCOSAL

## 2014-02-23 MED ORDER — METHYLPREDNISOLONE SODIUM SUCC 40 MG IJ SOLR
40.0000 mg | Freq: Two times a day (BID) | INTRAMUSCULAR | Status: DC
  Filled 2014-02-23 (×2): qty 1

## 2014-02-23 MED ORDER — CLINDAMYCIN PHOSPHATE 600 MG/50ML IV SOLN
600.0000 mg | Freq: Three times a day (TID) | INTRAVENOUS | Status: DC
  Administered 2014-02-23 – 2014-02-24 (×4): 600 mg via INTRAVENOUS
  Filled 2014-02-23 (×6): qty 50

## 2014-02-23 MED ORDER — SODIUM CHLORIDE 0.9 % IV SOLN
INTRAVENOUS | Status: DC
  Administered 2014-02-23: 75 mL/h via INTRAVENOUS
  Administered 2014-02-23: 05:00:00 via INTRAVENOUS

## 2014-02-23 MED ORDER — IOHEXOL 300 MG/ML  SOLN
75.0000 mL | Freq: Once | INTRAMUSCULAR | Status: AC | PRN
  Administered 2014-02-23: 75 mL via INTRAVENOUS

## 2014-02-23 NOTE — ED Provider Notes (Signed)
CSN: [id number]     Arrival date & time 02/22/14  1957 History   First MD Initiated Contact with Patient 02/22/14 2259     Chief Complaint  Patient presents with  . Shortness of Breath     (Consider location/radiation/quality/duration/timing/severity/associated sxs/prior Treatment) HPI  This is a very pleasant 68 year old female with history of hyperlipidemia, sleep apnea who presents with cough, fever, chest pain, and sore throat. Patient reports onset of symptoms approximately 1 week ago. She reports shortness of breath and anterior chest pain that is worse with cough. She reports chills without documented fevers. She also reports sore throat and decreased by mouth intake.  She has not received a flu shot this year.    Past Medical History  Diagnosis Date  . Mixed hyperlipidemia   . Osteoarthritis   . GERD (gastroesophageal reflux disease) 1995  . Asthma 2010  . Anxiety   . Depression   . Hoarseness   . Obstructive sleep apnea     using a cpap-severe  . Wears glasses    Past Surgical History  Procedure Laterality Date  . Rotator cuff repair Right 2005  . Cholecystectomy  1985   Family History  Problem Relation Age of Onset  . Cancer Father   . Cancer Mother   . Heart attack Mother   . Cancer Brother    History  Substance Use Topics  . Smoking status: Never Smoker   . Smokeless tobacco: Never Used  . Alcohol Use: No   OB History   Grav Para Term Preterm Abortions TAB SAB Ect Mult Living                 Review of Systems  Constitutional: Positive for fever and chills.  HENT: Positive for sore throat. Negative for drooling.   Respiratory: Positive for cough and shortness of breath. Negative for chest tightness.   Cardiovascular: Negative for chest pain.  Gastrointestinal: Negative for nausea, vomiting and abdominal pain.  Genitourinary: Negative for dysuria.  Musculoskeletal: Negative for back pain.  Neurological: Negative for headaches.   Psychiatric/Behavioral: Negative for confusion.  All other systems reviewed and are negative.     Allergies  Cephalexin; Omeprazole; and Rofecoxib  Home Medications   Prior to Admission medications   Medication Sig Start Date End Date Taking? Authorizing Provider  albuterol (PROVENTIL HFA;VENTOLIN HFA) 108 (90 BASE) MCG/ACT inhaler Inhale 2 puffs into the lungs every 6 (six) hours as needed for wheezing or shortness of breath. 07/29/13  Yes Fayrene Helper, MD  budesonide-formoterol [facility]) 160-4.5 MCG/ACT inhaler Inhale 2 puffs into the lungs as needed. 07/29/13  Yes Fayrene Helper, MD  FLUoxetine (PROZAC) 20 MG capsule Take 1 capsule (20 mg total) by mouth daily. 06/17/13  Yes Fayrene Helper, MD  furosemide (LASIX) 40 MG tablet Take 1 tablet (40 mg total) by mouth daily. 01/05/14  Yes Fayrene Helper, MD   BP 116/68  Pulse 110  Temp(Src) 99.9 F (37.7 C) (Oral)  Resp 21  Ht 5' 5.5" (1.664 m)  Wt 230 lb (104.327 kg)  BMI 37.68 kg/m2  SpO2 98% Physical Exam  Nursing note and vitals reviewed. Constitutional: She is oriented to person, place, and time.  Ill-appearing, muffled voice  HENT:  Head: Normocephalic and atraumatic.  Limited oropharyngeal exam, extensive swelling noted in the posterior oropharynx, no exudate noted  Eyes: EOM are normal. Pupils are equal, round, and reactive to light.  Neck: Neck supple.  Cardiovascular: Regular rhythm and normal heart sounds.  Tachycardia  Pulmonary/Chest: Effort normal. No stridor. No respiratory distress. She has no wheezes. She has rales.  Abdominal: Soft. Bowel sounds are normal. There is no tenderness. There is no rebound.  Musculoskeletal: She exhibits no edema.  Lymphadenopathy:    She has cervical adenopathy.  Neurological: She is alert and oriented to person, place, and time.  Skin: Skin is warm and dry.  Psychiatric: She has a normal mood and affect.    ED Course  Procedures (including critical care  time)  CRITICAL CARE Performed by: Thayer Jew, F   Total critical care time: 35 min  Critical care time was exclusive of separately billable procedures and treating other patients.  Critical care was necessary to treat or prevent imminent or life-threatening deterioration.  Critical care was time spent personally by me on the following activities: development of treatment plan with patient and/or surrogate as well as nursing, discussions with consultants, evaluation of patient's response to treatment, examination of patient, obtaining history from patient or surrogate, ordering and performing treatments and interventions, ordering and review of laboratory studies, ordering and review of radiographic studies, pulse oximetry and re-evaluation of patient's condition.  Labs Review Labs Reviewed  RAPID STREP SCREEN - Abnormal; Notable for the following:    Streptococcus, Group A Screen (Direct) POSITIVE (*)    All other components within normal limits  CBC WITH DIFFERENTIAL - Abnormal; Notable for the following:    WBC 24.7 (*)    HCT 35.3 (*)    MCV 72.5 (*)    MCH 25.9 (*)    Neutrophils Relative % 84 (*)    Lymphocytes Relative 8 (*)    Neutro Abs 20.7 (*)    Monocytes Absolute 2.0 (*)    All other components within normal limits  BASIC METABOLIC PANEL - Abnormal; Notable for the following:    Sodium 133 (*)    Glucose, Bld 129 (*)    GFR calc non Af Amer 88 (*)    All other components within normal limits  CULTURE, BLOOD (ROUTINE X 2)  CULTURE, BLOOD (ROUTINE X 2)  LACTIC ACID, PLASMA    Imaging Review Dg Chest 2 View  02/22/2014   CLINICAL DATA:  Shortness of breath and productive cough for 1 week. Fever and chills  EXAM: CHEST  2 VIEW  COMPARISON:  08/31/2013  FINDINGS: The heart size and mediastinal contours are within normal limits. Both lungs are clear. Spondylosis noted within the thoracic spine.  IMPRESSION: No active cardiopulmonary disease.   Electronically  Signed   By: Kerby Moors M.D.   On: 02/22/2014 20:16   Ct Soft Tissue Neck W Contrast  02/23/2014   CLINICAL DATA:  Shortness of breath and productive cough for 1 week. Throat feels swollen. Fever and chills and leukocytosis.  EXAM: CT NECK WITH CONTRAST  TECHNIQUE: Multidetector CT imaging of the neck was performed using the standard protocol following the bolus administration of intravenous contrast.  CONTRAST:  46mL OMNIPAQUE IOHEXOL 300 MG/ML  SOLN  COMPARISON:  Face CT 07/21/2013  FINDINGS: There is an advanced inflammatory process centered on the visceral compartment of the neck, likely originating as tonsillitis or pharyngitis in this patient with report of positive strep. Peripharyngeal edema and submucosal edema of the oropharynx, hypopharynx, and supraglottic larynx causes airway narrowing. The epiglottis is not primarily thickened. Diffuse spread of edema throughout the deep spaces of the neck, more extensive on the right. There is low-density expansion of the retropharynx. Inflammation extends into the upper mediastinum.  Phlegmonous changes in the right neck; There is no defined abscess for drainage. Bilateral reactive appearing cervical lymphadenopathy. Patent major cervical veins. No primary salivary gland inflammation. Negative thyroid gland. Clear apical lungs.  Diffuse and advanced degenerative disc disease. A central disc herniation at C3-4 causes advanced canal stenosis when combined with congenital spine narrowing. Disc osteophyte complexes are present at multiple cervical levels, also with spinal canal stenosis.  Critical Value/emergent results were called by telephone at the time of interpretation on 02/23/2014 at 2:14 am to Dr. Thayer Jew , who verbally acknowledged these results.  IMPRESSION: 1. Advanced infection of the visceral compartment, likely from pharyngitis/tonsillitis. Extensive edema narrows the hypopharynx and supraglottic larynx. Inflammatory changes extend to the  upper mediastinum. Phlegmonous changes present; no drainable abscess. 2. 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   Electronically Signed   By: Jorje Guild M.D.   On: 02/23/2014 02:19     EKG Interpretation   Date/Time:  Tuesday February 22 2014 23:49:26 EDT Ventricular Rate:  125 PR Interval:  94 QRS Duration: 72 QT Interval:  304 QTC Calculation: 438 R Axis:   -21 Text Interpretation:  Sinus tachycardia Borderline left axis deviation  Confirmed by HORTON  MD, COURTNEY ([id number]) on 02/23/2014 2:33:01 AM      MDM   Final diagnoses:  Sore throat  Strep pharyngitis    Patient presents with fever, chills, sore throat, and shortness of breath. She is ill-appearing on exam with muffled voice.  No evidence of stridor. She is febrile and tachycardic. Workup from triage reviewed including strep screen and chest x-ray. Strep screen is positive. Lab work was added on including CBC, BMP, lactate, and blood cultures. Patient was given IM Bicillin, Decadron, and Tylenol for fever. Rectal temperature of 103. Patient has evidence of leukocytosis to 25. Given this and limited oral pharyngeal exam with muffled voice, will obtain CT neck to evaluate for deep space infection. CT concerning for extensive edema of the oropharyngeal tissues extending inferiorly into the mediastinum with edema. While patient has no evidence of airway compromise at this time, if swelling continues, patient could develop airway compromise. Given this, discussed with admitting hospitalist. Plan to transfer to [facility] for close monitoring in the stepdown unit and availability of ENT and/or anesthesiology if needed. No drainable abscess at this time. Given the extensive nature of edema and infection, will give a dose of clindamycin as well.    Merryl Hacker, MD 02/23/14 [phone]

## 2014-02-23 NOTE — Progress Notes (Signed)
Patient admitted after midnight. Chart reviewed. Patient examined. Swelling improved. Able to swallow. Having pleuritic CP. Cont abx, steroids. Start clears and prn NSAIDs  Doree Barthel, MD [facility]

## 2014-02-23 NOTE — H&P (Signed)
PCP:   Tula Nakayama, MD   Chief Complaint:  Shortness of breath  HPI:  68 year old female who  has a past medical history of Mixed hyperlipidemia; Osteoarthritis; GERD (gastroesophageal reflux disease) (1995); Asthma (2010); Anxiety; Depression; Hoarseness; Obstructive sleep apnea; and Wears glasses. Today presents to the ED with chief complaint of cough and fever chest pain shortness of breath and sore throat. Patient says that the symptoms started about a week ago, and her voice has become more muffled. Patient also reports shortness of breath worse with coughing. She had fever, also had chills. She denies nausea vomiting or diarrhea strep screen was positive, chest x-ray did not show any significant abnormality. CT scan soft tissue neck was obtained which showed advanced infection of the visceral compartment from pharyngitis/tonsillitis. Extensive edema narrows the hypopharynx and subglottic larynx. Inflammatory changes extend to the upper mediastinum, no drainable abscess.  Allergies:   Allergies  Allergen Reactions  . Cephalexin Hives  . Omeprazole Hives  . Rofecoxib Rash      Past Medical History  Diagnosis Date  . Mixed hyperlipidemia   . Osteoarthritis   . GERD (gastroesophageal reflux disease) 1995  . Asthma 2010  . Anxiety   . Depression   . Hoarseness   . Obstructive sleep apnea     using a cpap-severe  . Wears glasses     Past Surgical History  Procedure Laterality Date  . Rotator cuff repair Right 2005  . Cholecystectomy  1985    Prior to Admission medications   Medication Sig Start Date End Date Taking? Authorizing Provider  albuterol (PROVENTIL HFA;VENTOLIN HFA) 108 (90 BASE) MCG/ACT inhaler Inhale 2 puffs into the lungs every 6 (six) hours as needed for wheezing or shortness of breath. 07/29/13  Yes Fayrene Helper, MD  budesonide-formoterol [facility]) 160-4.5 MCG/ACT inhaler Inhale 2 puffs into the lungs as needed. 07/29/13  Yes Fayrene Helper,  MD  FLUoxetine (PROZAC) 20 MG capsule Take 1 capsule (20 mg total) by mouth daily. 06/17/13  Yes Fayrene Helper, MD  furosemide (LASIX) 40 MG tablet Take 1 tablet (40 mg total) by mouth daily. 01/05/14  Yes Fayrene Helper, MD    Social History:  reports that she has never smoked. She has never used smokeless tobacco. She reports that she does not drink alcohol or use illicit drugs.  Family History  Problem Relation Age of Onset  . Cancer Father   . Cancer Mother   . Heart attack Mother   . Cancer Brother      All the positives are listed in BOLD  Review of Systems:  HEENT: Headache, blurred vision, runny nose, sore throat Neck: Hypothyroidism, hyperthyroidism,,lymphadenopathy Chest : Shortness of breath, history of COPD, Asthma Heart : Chest pain, history of coronary arterey disease GI:  Nausea, vomiting, diarrhea, constipation, GERD GU: Dysuria, urgency, frequency of urination, hematuria Neuro: Stroke, seizures, syncope Psych: Depression, anxiety, hallucinations   Physical Exam: Blood pressure 127/62, pulse 108, temperature 99.9 F (37.7 C), temperature source Oral, resp. rate 20, height 5' 5.5" (1.664 m), weight 104.327 kg (230 lb), SpO2 98.00%. Constitutional:   Patient is a well-developed and well-nourished female* in no acute distress and cooperative with exam. Head: Normocephalic and atraumatic Pharynx- pharyngeal edema noted, mild erythema Mouth: Mucus membranes moist Eyes: PERRL, EOMI, conjunctivae normal Neck: Supple, No Thyromegaly Cardiovascular: RRR, S1 normal, S2 normal Pulmonary/Chest: CTAB, no wheezes, rales, or rhonchi Abdominal: Soft. Non-tender, non-distended, bowel sounds are normal, no masses, organomegaly, or guarding present.  Neurological: A&O x3, Strenght is normal and symmetric bilaterally, cranial nerve II-XII are grossly intact, no focal motor deficit, sensory intact to light touch bilaterally.  Extremities : No Cyanosis, Clubbing or  Edema  Labs on Admission:  Basic Metabolic Panel:  Recent Labs Lab 02/22/14 2342  NA 133*  K 3.8  CL 96  CO2 22  GLUCOSE 129*  BUN 7  CREATININE 0.71  CALCIUM 9.3   Liver Function Tests: No results found for this basename: AST, ALT, ALKPHOS, BILITOT, PROT, ALBUMIN,  in the last 168 hours No results found for this basename: LIPASE, AMYLASE,  in the last 168 hours No results found for this basename: AMMONIA,  in the last 168 hours CBC:  Recent Labs Lab 02/22/14 2342  WBC 24.7*  NEUTROABS 20.7*  HGB 12.6  HCT 35.3*  MCV 72.5*  PLT 241   Cardiac Enzymes: No results found for this basename: CKTOTAL, CKMB, CKMBINDEX, TROPONINI,  in the last 168 hours  BNP (last 3 results) No results found for this basename: PROBNP,  in the last 8760 hours CBG: No results found for this basename: GLUCAP,  in the last 168 hours  Radiological Exams on Admission: Dg Chest 2 View  02/22/2014   CLINICAL DATA:  Shortness of breath and productive cough for 1 week. Fever and chills  EXAM: CHEST  2 VIEW  COMPARISON:  08/31/2013  FINDINGS: The heart size and mediastinal contours are within normal limits. Both lungs are clear. Spondylosis noted within the thoracic spine.  IMPRESSION: No active cardiopulmonary disease.   Electronically Signed   By: Kerby Moors M.D.   On: 02/22/2014 20:16   Ct Soft Tissue Neck W Contrast  02/23/2014   CLINICAL DATA:  Shortness of breath and productive cough for 1 week. Throat feels swollen. Fever and chills and leukocytosis.  EXAM: CT NECK WITH CONTRAST  TECHNIQUE: Multidetector CT imaging of the neck was performed using the standard protocol following the bolus administration of intravenous contrast.  CONTRAST:  39mL OMNIPAQUE IOHEXOL 300 MG/ML  SOLN  COMPARISON:  Face CT 07/21/2013  FINDINGS: There is an advanced inflammatory process centered on the visceral compartment of the neck, likely originating as tonsillitis or pharyngitis in this patient with report of  positive strep. Peripharyngeal edema and submucosal edema of the oropharynx, hypopharynx, and supraglottic larynx causes airway narrowing. The epiglottis is not primarily thickened. Diffuse spread of edema throughout the deep spaces of the neck, more extensive on the right. There is low-density expansion of the retropharynx. Inflammation extends into the upper mediastinum. Phlegmonous changes in the right neck; There is no defined abscess for drainage. Bilateral reactive appearing cervical lymphadenopathy. Patent major cervical veins. No primary salivary gland inflammation. Negative thyroid gland. Clear apical lungs.  Diffuse and advanced degenerative disc disease. A central disc herniation at C3-4 causes advanced canal stenosis when combined with congenital spine narrowing. Disc osteophyte complexes are present at multiple cervical levels, also with spinal canal stenosis.  Critical Value/emergent results were called by telephone at the time of interpretation on 02/23/2014 at 2:14 am to Dr. Thayer Jew , who verbally acknowledged these results.  IMPRESSION: 1. Advanced infection of the visceral compartment, likely from pharyngitis/tonsillitis. Extensive edema narrows the hypopharynx and supraglottic larynx. Inflammatory changes extend to the upper mediastinum. Phlegmonous changes present; no drainable abscess. 2. 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   Electronically Signed   By: Jorje Guild M.D.   On: 02/23/2014 02:19    EKG: Independently  reviewed. Sinus tachycardia   Assessment/Plan Principal Problem:   Pharyngitis due to group A beta hemolytic Streptococci Active Problems:   Chronic hoarseness   Intrinsic asthma  Pharyngitis due to group A beta-hemolytic streptococci Patient was given benzathine penicillin G 1.2 million IV 1 and Decadron 10 mg IV 1 in the ED, also given 1 dose of clindamycin. Will continue with clindamycin 600 mg IV every 8 hours, will start  Solu-Medrol 40 mg IV every 12 hours. Patient at this time is not having any difficulty breathing, but is at risk for worsening of the pharyngeal swelling, so will be transferred to stepdown at [facility] and discussed with Dr. Ernestina Patches on call. Who has accepted the patient.  Asthma Continue albuterol nebulizers every 2 hours when necessary.  Code status: Patient is full code  Family discussion: Admission, patients condition and plan of care including tests being ordered have been discussed with the patient and her family at bedside who indicate understanding and agree with the plan and Code Status.   Time Spent on Admission: 60 minutes  [facility] Pager: [phone] 02/23/2014, 2:58 AM  If 7PM-7AM, please contact night-coverage  [email or web address]  Password [email or web address]

## 2014-02-23 NOTE — Care Management Note (Addendum)
    Page 1 of 1   02/25/2014     5:04:33 PM CARE MANAGEMENT NOTE 02/25/2014  Patient:  Angela Chase, Angela Chase   Account Number:  [id number]  Date Initiated:  02/23/2014  Documentation initiated by:  Elissa Hefty  Subjective/Objective Assessment:   adm w pharyngitis     Action/Plan:   lives alone, pcp dr Joycelyn Schmid simpson   Anticipated DC Date:  02/27/2014   Anticipated DC Plan:  [place]  CM consult      Choice offered to / List presented to:             Status of service:  In process, will continue to follow Medicare Important Message given?  YES (If response is "NO", the following Medicare IM given date fields will be blank) Date Medicare IM given:  02/25/2014 Medicare IM given by:  Tayllor Breitenstein Date Additional Medicare IM given:   Additional Medicare IM given by:    Discharge Disposition:    Per UR Regulation:  Reviewed for med. necessity/level of care/duration of stay  If discussed at [place] of Stay Meetings, dates discussed:    Comments:

## 2014-02-24 ENCOUNTER — Ambulatory Visit: Payer: Medicare Other | Admitting: Family Medicine

## 2014-02-24 DIAGNOSIS — R0781 Pleurodynia: Secondary | ICD-10-CM

## 2014-02-24 DIAGNOSIS — R609 Edema, unspecified: Secondary | ICD-10-CM | POA: Diagnosis present

## 2014-02-24 LAB — GLUCOSE, CAPILLARY
GLUCOSE-CAPILLARY: 132 mg/dL — AB (ref 70–99)
GLUCOSE-CAPILLARY: 106 mg/dL — AB (ref 70–99)
GLUCOSE-CAPILLARY: 121 mg/dL — AB (ref 70–99)
GLUCOSE-CAPILLARY: 135 mg/dL — AB (ref 70–99)

## 2014-02-24 MED ORDER — BUDESONIDE-FORMOTEROL FUMARATE 160-4.5 MCG/ACT IN AERO
2.0000 | INHALATION_SPRAY | Freq: Two times a day (BID) | RESPIRATORY_TRACT | Status: DC
  Administered 2014-02-24: 2 via RESPIRATORY_TRACT
  Filled 2014-02-24 (×2): qty 6

## 2014-02-24 MED ORDER — KETOROLAC TROMETHAMINE 15 MG/ML IJ SOLN
15.0000 mg | Freq: Once | INTRAMUSCULAR | Status: AC
  Administered 2014-02-24: 15 mg via INTRAVENOUS
  Filled 2014-02-24: qty 1

## 2014-02-24 MED ORDER — GUAIFENESIN-DM 100-10 MG/5ML PO SYRP: 5.0000 mL | ORAL_SOLUTION | ORAL | Status: DC | PRN

## 2014-02-24 MED ORDER — CLINDAMYCIN HCL 300 MG PO CAPS
300.0000 mg | ORAL_CAPSULE | Freq: Four times a day (QID) | ORAL | Status: DC
  Administered 2014-02-24 – 2014-02-25 (×6): 300 mg via ORAL
  Filled 2014-02-24 (×8): qty 1

## 2014-02-24 MED ORDER — PREDNISONE 20 MG PO TABS
40.0000 mg | ORAL_TABLET | Freq: Every day | ORAL | Status: DC
  Administered 2014-02-24 – 2014-02-25 (×2): 40 mg via ORAL
  Filled 2014-02-24 (×3): qty 2

## 2014-02-24 MED ORDER — ACETAMINOPHEN 160 MG/5ML PO SOLN
650.0000 mg | Freq: Four times a day (QID) | ORAL | Status: DC | PRN
  Filled 2014-02-24: qty 20.3

## 2014-02-24 MED ORDER — FUROSEMIDE 40 MG PO TABS
40.0000 mg | ORAL_TABLET | Freq: Every day | ORAL | Status: DC
  Administered 2014-02-24 – 2014-02-25 (×2): 40 mg via ORAL
  Filled 2014-02-24 (×3): qty 1

## 2014-02-24 NOTE — Progress Notes (Signed)
Pt to rm 2w32 via wheelchair from 2h. Pt settled in bed with 02 in place and instructed to call for help.

## 2014-02-24 NOTE — Progress Notes (Signed)
[facility] PROGRESS NOTE  Angela Chase DJS:[id number] DOB: 1945/06/04 DOA: 02/22/2014 PCP: Tula Nakayama, MD  Assessment/Plan:  Principal Problem:   Pharyngitis due to group A beta hemolytic Streptococci: improving. Change to po abx and steroid. Transfer to floor. Increase activity. Home 1-2 days Active Problems:   Hyperlipemia   OSA (obstructive sleep apnea)   Chronic hoarseness   Intrinsic asthma   Pleuritic chest pain: prn toradol. Helped yesterday.   Obesity   Peripheral edema: resume lasix and d/c fluids  HPI/Subjective: Feeling much better. Tolerating clears. C/o foot edema wants to resume lasix. Still with CP when coughs or repositions.  Objective: Filed Vitals:   02/24/14 0300  BP: 108/60  Pulse: 81  Temp: 98 F (36.7 C)  Resp: 22    Intake/Output Summary (Last 24 hours) at 02/24/14 0839 Last data filed at 02/24/14 0800  Gross per 24 hour  Intake   2025 ml  Output    250 ml  Net   1775 ml   Filed Weights   02/22/14 2003  Weight: 104.327 kg (230 lb)    Exam:   General:  Comfortable  HEENT: tonsilar exudates, less edema in posterior pharynx and tonsils.  Neck. Some submandibular adenopathy. Swelling improved  Cardiovascular: RRR  Respiratory: CTA without MGR  Abdomen: S, nt, nd  Ext: 1 + edema  Basic Metabolic Panel:  Recent Labs Lab 02/22/14 2342 02/23/14 0500  NA 133* 136*  K 3.8 3.7  CL 96 98  CO2 22 22  GLUCOSE 129* 140*  BUN 7 6  CREATININE 0.71 0.69  CALCIUM 9.3 8.9   Liver Function Tests:  Recent Labs Lab 02/23/14 0500  AST 55*  ALT 94*  ALKPHOS 233*  BILITOT 1.5*  PROT 8.0  ALBUMIN 2.8*   No results found for this basename: LIPASE, AMYLASE,  in the last 168 hours No results found for this basename: AMMONIA,  in the last 168 hours CBC:  Recent Labs Lab 02/22/14 2342 02/23/14 0500  WBC 24.7* 33.1*  NEUTROABS 20.7*  --   HGB 12.6 12.7  HCT 35.3* 36.0  MCV 72.5* 72.7*  PLT 241 229    Cardiac Enzymes: No results found for this basename: CKTOTAL, CKMB, CKMBINDEX, TROPONINI,  in the last 168 hours BNP (last 3 results) No results found for this basename: PROBNP,  in the last 8760 hours CBG:  Recent Labs Lab 02/23/14 0757 02/23/14 1220 02/23/14 1648  GLUCAP 152* 124* 153*    Recent Results (from the past 240 hour(s))  RAPID STREP SCREEN     Status: Abnormal   Collection Time    02/22/14  9:55 PM      Result Value Ref Range Status   Streptococcus, Group A Screen (Direct) POSITIVE (*) NEGATIVE Final  CULTURE, BLOOD (ROUTINE X 2)     Status: None   Collection Time    02/23/14  2:29 AM      Result Value Ref Range Status   Specimen Description BLOOD RIGHT ANTECUBITAL   Final   Special Requests BOTTLES DRAWN AEROBIC ONLY 8CC   Final   Culture NO GROWTH <24 HRS   Final   Report Status PENDING   Incomplete  CULTURE, BLOOD (ROUTINE X 2)     Status: None   Collection Time    02/23/14  2:45 AM      Result Value Ref Range Status   Specimen Description BLOOD RIGHT FOREARM   Final   Special Requests BOTTLES DRAWN AEROBIC ONLY 3CC  Final   Culture NO GROWTH <24 HRS   Final   Report Status PENDING   Incomplete  MRSA PCR SCREENING     Status: None   Collection Time    02/23/14  4:39 AM      Result Value Ref Range Status   MRSA by PCR NEGATIVE  NEGATIVE Final   Comment:            The GeneXpert MRSA Assay (FDA     approved for NASAL specimens     only), is one component of a     comprehensive MRSA colonization     surveillance program. It is not     intended to diagnose MRSA     infection nor to guide or     monitor treatment for     MRSA infections.     Studies: Dg Chest 2 View  02/22/2014   CLINICAL DATA:  Shortness of breath and productive cough for 1 week. Fever and chills  EXAM: CHEST  2 VIEW  COMPARISON:  08/31/2013  FINDINGS: The heart size and mediastinal contours are within normal limits. Both lungs are clear. Spondylosis noted within the thoracic  spine.  IMPRESSION: No active cardiopulmonary disease.   Electronically Signed   By: Kerby Moors M.D.   On: 02/22/2014 20:16   Ct Soft Tissue Neck W Contrast  02/23/2014   CLINICAL DATA:  Shortness of breath and productive cough for 1 week. Throat feels swollen. Fever and chills and leukocytosis.  EXAM: CT NECK WITH CONTRAST  TECHNIQUE: Multidetector CT imaging of the neck was performed using the standard protocol following the bolus administration of intravenous contrast.  CONTRAST:  23mL OMNIPAQUE IOHEXOL 300 MG/ML  SOLN  COMPARISON:  Face CT 07/21/2013  FINDINGS: There is an advanced inflammatory process centered on the visceral compartment of the neck, likely originating as tonsillitis or pharyngitis in this patient with report of positive strep. Peripharyngeal edema and submucosal edema of the oropharynx, hypopharynx, and supraglottic larynx causes airway narrowing. The epiglottis is not primarily thickened. Diffuse spread of edema throughout the deep spaces of the neck, more extensive on the right. There is low-density expansion of the retropharynx. Inflammation extends into the upper mediastinum. Phlegmonous changes in the right neck; There is no defined abscess for drainage. Bilateral reactive appearing cervical lymphadenopathy. Patent major cervical veins. No primary salivary gland inflammation. Negative thyroid gland. Clear apical lungs.  Diffuse and advanced degenerative disc disease. A central disc herniation at C3-4 causes advanced canal stenosis when combined with congenital spine narrowing. Disc osteophyte complexes are present at multiple cervical levels, also with spinal canal stenosis.  Critical Value/emergent results were called by telephone at the time of interpretation on 02/23/2014 at 2:14 am to Dr. Thayer Jew , who verbally acknowledged these results.  IMPRESSION: 1. Advanced infection of the visceral compartment, likely from pharyngitis/tonsillitis. Extensive edema narrows the  hypopharynx and supraglottic larynx. Inflammatory changes extend to the upper mediastinum. Phlegmonous changes present; no drainable abscess. 2. 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   Electronically Signed   By: Jorje Guild M.D.   On: 02/23/2014 02:19    Scheduled Meds: . antiseptic oral rinse  7 mL Mouth Rinse BID  . clindamycin (CLEOCIN) IV  600 mg Intravenous 3 times per day  . enoxaparin (LOVENOX) injection  40 mg Subcutaneous Q24H  . FLUoxetine  20 mg Oral Daily  . methylPREDNISolone (SOLU-MEDROL) injection  60 mg Intravenous Daily   Continuous Infusions: .  sodium chloride 75 mL/hr (02/23/14 1712)    Time spent: 25 minutes  [facility] Pager [phone]. If 7PM-7AM, please contact night-coverage at [email or web address], password [facility] 02/24/2014, 8:39 AM  LOS: 2 days

## 2014-02-24 NOTE — Clinical Documentation Improvement (Signed)
  68 year old black female admitted with Pharyngitis due to group A beta-hemolytic streptococci.  Temp 103.7 rectal, heart rate 110's to 120's, WBC 24.7 in ED.  Treated with benzathine penicillin G, Decadron and Clindamycin.   Please document if a condition below provides greater specificity regarding the patient's Pharyngitis:   - Sepsis, Present on Admission   - Other Condition   - Unable to Clinically Determine    Thank You, Erling Conte ,RN Clinical Documentation Specialist:  [facility]

## 2014-02-25 ENCOUNTER — Encounter (HOSPITAL_COMMUNITY): Payer: Self-pay | Admitting: Emergency Medicine

## 2014-02-25 ENCOUNTER — Emergency Department (HOSPITAL_COMMUNITY): Payer: Medicare Other

## 2014-02-25 DIAGNOSIS — Z8719 Personal history of other diseases of the digestive system: Secondary | ICD-10-CM | POA: Insufficient documentation

## 2014-02-25 DIAGNOSIS — R05 Cough: Secondary | ICD-10-CM | POA: Diagnosis not present

## 2014-02-25 DIAGNOSIS — M542 Cervicalgia: Secondary | ICD-10-CM | POA: Insufficient documentation

## 2014-02-25 DIAGNOSIS — G4733 Obstructive sleep apnea (adult) (pediatric): Secondary | ICD-10-CM | POA: Insufficient documentation

## 2014-02-25 DIAGNOSIS — J45901 Unspecified asthma with (acute) exacerbation: Secondary | ICD-10-CM | POA: Insufficient documentation

## 2014-02-25 DIAGNOSIS — J020 Streptococcal pharyngitis: Secondary | ICD-10-CM | POA: Insufficient documentation

## 2014-02-25 DIAGNOSIS — F419 Anxiety disorder, unspecified: Secondary | ICD-10-CM | POA: Insufficient documentation

## 2014-02-25 DIAGNOSIS — Z8639 Personal history of other endocrine, nutritional and metabolic disease: Secondary | ICD-10-CM | POA: Insufficient documentation

## 2014-02-25 DIAGNOSIS — Z79899 Other long term (current) drug therapy: Secondary | ICD-10-CM | POA: Insufficient documentation

## 2014-02-25 DIAGNOSIS — F329 Major depressive disorder, single episode, unspecified: Secondary | ICD-10-CM | POA: Insufficient documentation

## 2014-02-25 LAB — CBC WITH DIFFERENTIAL/PLATELET
Basophils Absolute: 0.3 10*3/uL — ABNORMAL HIGH (ref 0.0–0.1)
Basophils Relative: 1 % (ref 0–1)
EOS PCT: 0 % (ref 0–5)
Eosinophils Absolute: 0 10*3/uL (ref 0.0–0.7)
HCT: 37.4 % (ref 36.0–46.0)
Hemoglobin: 13.4 g/dL (ref 12.0–15.0)
Lymphocytes Relative: 8 % — ABNORMAL LOW (ref 12–46)
Lymphs Abs: 2.3 10*3/uL (ref 0.7–4.0)
MCH: 26.2 pg (ref 26.0–34.0)
MCHC: 35.8 g/dL (ref 30.0–36.0)
MCV: 73.2 fL — ABNORMAL LOW (ref 78.0–100.0)
MONO ABS: 1.2 10*3/uL — AB (ref 0.1–1.0)
MONOS PCT: 4 % (ref 3–12)
NEUTROS PCT: 87 % — AB (ref 43–77)
Neutro Abs: 25 10*3/uL — ABNORMAL HIGH (ref 1.7–7.7)
Platelets: 284 10*3/uL (ref 150–400)
RBC: 5.11 MIL/uL (ref 3.87–5.11)
RDW: 14.7 % (ref 11.5–15.5)
WBC Morphology: INCREASED
WBC: 28.8 10*3/uL — AB (ref 4.0–10.5)

## 2014-02-25 LAB — COMPREHENSIVE METABOLIC PANEL
ALBUMIN: 2.5 g/dL — AB (ref 3.5–5.2)
ALT: 64 U/L — ABNORMAL HIGH (ref 0–35)
AST: 34 U/L (ref 0–37)
Alkaline Phosphatase: 203 U/L — ABNORMAL HIGH (ref 39–117)
Anion gap: 14 (ref 5–15)
BILIRUBIN TOTAL: 0.4 mg/dL (ref 0.3–1.2)
BUN: 15 mg/dL (ref 6–23)
CHLORIDE: 99 meq/L (ref 96–112)
CO2: 24 mEq/L (ref 19–32)
CREATININE: 0.87 mg/dL (ref 0.50–1.10)
Calcium: 9.2 mg/dL (ref 8.4–10.5)
GFR calc Af Amer: 79 mL/min — ABNORMAL LOW (ref >90)
GFR calc non Af Amer: 68 mL/min — ABNORMAL LOW (ref >90)
Glucose, Bld: 112 mg/dL — ABNORMAL HIGH (ref 70–99)
Potassium: 4.5 mEq/L (ref 3.7–5.3)
SODIUM: 137 meq/L (ref 137–147)
Total Protein: 8.6 g/dL — ABNORMAL HIGH (ref 6.0–8.3)

## 2014-02-25 LAB — GLUCOSE, CAPILLARY
Glucose-Capillary: 92 mg/dL (ref 70–99)
Glucose-Capillary: 135 mg/dL — ABNORMAL HIGH (ref 70–99)
Glucose-Capillary: 153 mg/dL — ABNORMAL HIGH (ref 70–99)

## 2014-02-25 LAB — PRO B NATRIURETIC PEPTIDE: PRO B NATRI PEPTIDE: 344 pg/mL — AB (ref 0–125)

## 2014-02-25 MED ORDER — IBUPROFEN 200 MG PO TABS: 400.0000 mg | ORAL_TABLET | Freq: Three times a day (TID) | ORAL | Status: DC

## 2014-02-25 MED ORDER — KETOROLAC TROMETHAMINE 15 MG/ML IJ SOLN
15.0000 mg | Freq: Once | INTRAMUSCULAR | Status: AC
  Administered 2014-02-25: 15 mg via INTRAVENOUS

## 2014-02-25 MED ORDER — GUAIFENESIN-DM 100-10 MG/5ML PO SYRP: 5.0000 mL | ORAL_SOLUTION | ORAL | Status: DC | PRN

## 2014-02-25 MED ORDER — PENICILLIN V POTASSIUM 500 MG PO TABS: 500.0000 mg | ORAL_TABLET | Freq: Three times a day (TID) | ORAL | Status: DC

## 2014-02-25 NOTE — Discharge Summary (Signed)
Physician Discharge Summary  Angela Chase NGE:[id number] DOB: 27-Sep-1945 DOA: 02/22/2014  PCP: Tula Nakayama, MD  Admit date: 02/22/2014 Discharge date: 02/25/2014  Time spent: greater than 30 minutes  Recommendations for Outpatient Follow-up:  1. Stressed compliance with CPAP  Discharge Diagnoses:  Principal Problem:   Pharyngitis due to group A beta hemolytic Streptococci, severe Active Problems:   Hyperlipemia   OSA (obstructive sleep apnea): has been compliant with CPAP and recommend continued CPAP use at home   Chronic hoarseness   Intrinsic asthma   Pleuritic chest pain   Obesity   Peripheral edema  Discharge Condition: stable  Filed Weights   02/22/14 2003  Weight: 104.327 kg (230 lb)    History of present illness:   68 year old female who has a past medical history of Mixed hyperlipidemia; Osteoarthritis; GERD (gastroesophageal reflux disease) (1995); Asthma (2010); Anxiety; Depression; Hoarseness; Obstructive sleep apnea; and Wears glasses.  Today presents to the ED with chief complaint of cough and fever chest pain shortness of breath and sore throat. Patient says that the symptoms started about a week ago, and her voice has become more muffled. Patient also reports shortness of breath worse with coughing. She had fever, also had chills.  She denies nausea vomiting or diarrhea strep screen was positive, chest x-ray did not show any significant abnormality. CT scan soft tissue neck was obtained which showed advanced infection of the visceral compartment from pharyngitis/tonsillitis. Extensive edema narrows the hypopharynx and subglottic larynx. Inflammatory changes extend to the upper mediastinum, no drainable abscess. Transferred from [facility] ED to [facility] step down unit for ENT/critical care backup if needed  Hospital Course:     Pharyngitis due to group A beta hemolytic Streptococci: started on IV clindamycin, and IV steroids. Swelling and difficulty  swallowing improved quickly. Tolerating solid diet, po abx and minimal submandibular lymphadenopathy. No need for further steroid. Will narrow to [facility] as outpatient to avoid complications.  Active Problems:   Hyperlipemia   OSA (obstructive sleep apnea): has been compliant with CPAP here. Day of discharge, patient upset that she was informed her insurance may not cover continued CPAP, due to compliance issues. Patient reports compliance at home, but "machine not registering". I personally called DME company and recommended they not d/c cpap without doctors order, especially in light of recent admission to hospital. I encouraged patient to keep a log of CPAP usage to compare to machine numbers in case of discrepancy (which patient maintains is the case)   Chronic hoarseness   Intrinsic asthma: stable. No wheezing currently   Pleuritic chest pain: much improved with toradol. May continue ibuprofen with meals for the next few days.   Obesity   Peripheral edema: lasix resumed   Procedures:  none  Consultations:  none  Discharge Exam: Filed Vitals:   02/25/14 0545  BP: 126/70  Pulse: 72  Temp: 97.7 F (36.5 C)  Resp: 20    General: talking on phone HEENT: op unobstructed. Tonsillar and posterior pharynx edema much improved Neck no edema. supple Cardiovascular: RRR Respiratory: CTA abd s, nt, nd Ext no CCE  Discharge Instructions   Discharge instructions    Complete by:  As directed   Soft diet     Increase activity slowly    Complete by:  As directed           Current Discharge Medication List    START taking these medications   Details  guaiFENesin-dextromethorphan (ROBITUSSIN DM) 100-10 MG/5ML syrup Take 5 mLs  by mouth every 4 (four) hours as needed for cough. Qty: 118 mL, Refills: 0    ibuprofen (ADVIL,MOTRIN) 200 MG tablet Take 2 tablets (400 mg total) by mouth 3 (three) times daily with meals. For 2 - 3 days then as needed Refills: 0    penicillin v  potassium (VEETID) 500 MG tablet Take 1 tablet (500 mg total) by mouth 3 (three) times daily. Until gone Qty: 18 tablet, Refills: 0      CONTINUE these medications which have NOT CHANGED   Details  albuterol (PROVENTIL HFA;VENTOLIN HFA) 108 (90 BASE) MCG/ACT inhaler Inhale 2 puffs into the lungs every 6 (six) hours as needed for wheezing or shortness of breath. Qty: 18 g, Refills: 0   Associated Diagnoses: Unspecified asthma, with exacerbation    budesonide-formoterol (SYMBICORT) 160-4.5 MCG/ACT inhaler Inhale 2 puffs into the lungs as needed.    FLUoxetine (PROZAC) 20 MG capsule Take 1 capsule (20 mg total) by mouth daily. Qty: 30 capsule, Refills: 3    furosemide (LASIX) 40 MG tablet Take 1 tablet (40 mg total) by mouth daily. Qty: 30 tablet, Refills: 1       Allergies  Allergen Reactions  . Cephalexin Hives  . Omeprazole Hives  . Rofecoxib Rash   Follow-up Information   Follow up with Tula Nakayama, MD. (1-2 weeks)    Specialty:  [facility]   Contact information:   [address] [place] [address] [phone]        The results of significant diagnostics from this hospitalization (including imaging, microbiology, ancillary and laboratory) are listed below for reference.    Significant Diagnostic Studies: Dg Chest 2 View  02/22/2014   CLINICAL DATA:  Shortness of breath and productive cough for 1 week. Fever and chills  EXAM: CHEST  2 VIEW  COMPARISON:  08/31/2013  FINDINGS: The heart size and mediastinal contours are within normal limits. Both lungs are clear. Spondylosis noted within the thoracic spine.  IMPRESSION: No active cardiopulmonary disease.   Electronically Signed   By: Kerby Moors M.D.   On: 02/22/2014 20:16   Ct Soft Tissue Neck W Contrast  02/23/2014   CLINICAL DATA:  Shortness of breath and productive cough for 1 week. Throat feels swollen. Fever and chills and leukocytosis.  EXAM: CT NECK WITH CONTRAST  TECHNIQUE:  Multidetector CT imaging of the neck was performed using the standard protocol following the bolus administration of intravenous contrast.  CONTRAST:  80mL OMNIPAQUE IOHEXOL 300 MG/ML  SOLN  COMPARISON:  Face CT 07/21/2013  FINDINGS: There is an advanced inflammatory process centered on the visceral compartment of the neck, likely originating as tonsillitis or pharyngitis in this patient with report of positive strep. Peripharyngeal edema and submucosal edema of the oropharynx, hypopharynx, and supraglottic larynx causes airway narrowing. The epiglottis is not primarily thickened. Diffuse spread of edema throughout the deep spaces of the neck, more extensive on the right. There is low-density expansion of the retropharynx. Inflammation extends into the upper mediastinum. Phlegmonous changes in the right neck; There is no defined abscess for drainage. Bilateral reactive appearing cervical lymphadenopathy. Patent major cervical veins. No primary salivary gland inflammation. Negative thyroid gland. Clear apical lungs.  Diffuse and advanced degenerative disc disease. A central disc herniation at C3-4 causes advanced canal stenosis when combined with congenital spine narrowing. Disc osteophyte complexes are present at multiple cervical levels, also with spinal canal stenosis.  Critical Value/emergent results were called by telephone at the time of interpretation  on 02/23/2014 at 2:14 am to Dr. Thayer Jew , who verbally acknowledged these results.  IMPRESSION: 1. Advanced infection of the visceral compartment, likely from pharyngitis/tonsillitis. Extensive edema narrows the hypopharynx and supraglottic larynx. Inflammatory changes extend to the upper mediastinum. Phlegmonous changes present; no drainable abscess. 2. 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   Electronically Signed   By: Jorje Guild M.D.   On: 02/23/2014 02:19    Microbiology: Recent Results (from the past 240 hour(s))   RAPID STREP SCREEN     Status: Abnormal   Collection Time    02/22/14  9:55 PM      Result Value Ref Range Status   Streptococcus, Group A Screen (Direct) POSITIVE (*) NEGATIVE Final  CULTURE, BLOOD (ROUTINE X 2)     Status: None   Collection Time    02/23/14  2:29 AM      Result Value Ref Range Status   Specimen Description BLOOD RIGHT ANTECUBITAL   Final   Special Requests BOTTLES DRAWN AEROBIC ONLY 8CC   Final   Culture NO GROWTH 2 DAYS   Final   Report Status PENDING   Incomplete  CULTURE, BLOOD (ROUTINE X 2)     Status: None   Collection Time    02/23/14  2:45 AM      Result Value Ref Range Status   Specimen Description BLOOD RIGHT FOREARM   Final   Special Requests BOTTLES DRAWN AEROBIC ONLY 3CC   Final   Culture NO GROWTH 2 DAYS   Final   Report Status PENDING   Incomplete  MRSA PCR SCREENING     Status: None   Collection Time    02/23/14  4:39 AM      Result Value Ref Range Status   MRSA by PCR NEGATIVE  NEGATIVE Final   Comment:            The GeneXpert MRSA Assay (FDA     approved for NASAL specimens     only), is one component of a     comprehensive MRSA colonization     surveillance program. It is not     intended to diagnose MRSA     infection nor to guide or     monitor treatment for     MRSA infections.     Labs: Basic Metabolic Panel:  Recent Labs Lab 02/22/14 2342 02/23/14 0500  NA 133* 136*  K 3.8 3.7  CL 96 98  CO2 22 22  GLUCOSE 129* 140*  BUN 7 6  CREATININE 0.71 0.69  CALCIUM 9.3 8.9   Liver Function Tests:  Recent Labs Lab 02/23/14 0500  AST 55*  ALT 94*  ALKPHOS 233*  BILITOT 1.5*  PROT 8.0  ALBUMIN 2.8*   No results found for this basename: LIPASE, AMYLASE,  in the last 168 hours No results found for this basename: AMMONIA,  in the last 168 hours CBC:  Recent Labs Lab 02/22/14 2342 02/23/14 0500  WBC 24.7* 33.1*  NEUTROABS 20.7*  --   HGB 12.6 12.7  HCT 35.3* 36.0  MCV 72.5* 72.7*  PLT 241 229   Cardiac  Enzymes: No results found for this basename: CKTOTAL, CKMB, CKMBINDEX, TROPONINI,  in the last 168 hours BNP: BNP (last 3 results) No results found for this basename: PROBNP,  in the last 8760 hours CBG:  Recent Labs Lab 02/24/14 1224 02/24/14 1703 02/24/14 2122 02/25/14 0622 02/25/14 1149  GLUCAP 106* 121* 135* 92 135*  SignedDelfina Redwood  [facility] 02/25/2014, 2:19 PM

## 2014-02-25 NOTE — ED Notes (Signed)
Pt. reports productive cough , chest congestion / nasal congestion and exertional dyspnea onset this evening , denies fever or chills.

## 2014-02-25 NOTE — Progress Notes (Signed)
Pt upset with d/c. She doesn't feel like she is medically stable. Dr.Sullivan, care management, and AC aware. Care management up to speak with pt.

## 2014-02-26 ENCOUNTER — Emergency Department (HOSPITAL_COMMUNITY)
Admission: EM | Admit: 2014-02-26 | Discharge: 2014-02-26 | Disposition: A | Discharge status: Home / Self Care | Payer: Medicare Other | Attending: Emergency Medicine | Admitting: Emergency Medicine

## 2014-02-26 DIAGNOSIS — R059 Cough, unspecified: Secondary | ICD-10-CM

## 2014-02-26 DIAGNOSIS — R05 Cough: Secondary | ICD-10-CM

## 2014-02-26 DIAGNOSIS — J020 Streptococcal pharyngitis: Secondary | ICD-10-CM

## 2014-02-26 DIAGNOSIS — G4733 Obstructive sleep apnea (adult) (pediatric): Secondary | ICD-10-CM

## 2014-02-26 DIAGNOSIS — J9801 Acute bronchospasm: Secondary | ICD-10-CM

## 2014-02-26 MED ORDER — FLUCONAZOLE 150 MG PO TABS: 150.0000 mg | ORAL_TABLET | Freq: Every day | ORAL | Status: DC

## 2014-02-26 MED ORDER — SPACER/AERO-HOLDING CHAMBERS DEVI: Status: DC

## 2014-02-26 MED ORDER — IPRATROPIUM-ALBUTEROL 0.5-2.5 (3) MG/3ML IN SOLN
3.0000 mL | RESPIRATORY_TRACT | Status: DC
  Administered 2014-02-26: 3 mL via RESPIRATORY_TRACT
  Filled 2014-02-26: qty 3

## 2014-02-26 MED ORDER — PREDNISONE 50 MG PO TABS
ORAL_TABLET | ORAL | Status: DC
Start: 2014-02-26 — End: 2014-02-26

## 2014-02-26 MED ORDER — ALBUTEROL SULFATE HFA 108 (90 BASE) MCG/ACT IN AERS: 2.0000 | INHALATION_SPRAY | Freq: Four times a day (QID) | RESPIRATORY_TRACT | Status: DC | PRN

## 2014-02-26 MED ORDER — PREDNISONE 20 MG PO TABS
40.0000 mg | ORAL_TABLET | Freq: Every day | ORAL | Status: DC
  Administered 2014-02-26: 40 mg via ORAL
  Filled 2014-02-26: qty 2

## 2014-02-26 MED ORDER — PREDNISONE 50 MG PO TABS: ORAL_TABLET | ORAL | Status: DC

## 2014-02-26 NOTE — ED Notes (Signed)
Hospitalist at bedside 

## 2014-02-26 NOTE — Discharge Instructions (Signed)
You are doing great. You are experiencing symptoms that are typical during the recovery stage from your infection and tissue swelling.  Please start your cough medicine. Please remember to use your inhaler and spacer as instructed when needed for episodes when your breathing gets short Please take the steroids on Sunday and Monday Please call your primary doctor if you have further questions.  Please finish out your antibiotics as instructed Please take the diflucan if your get a yeast infection from the steroids and antibiotics Have a blessed day

## 2014-02-26 NOTE — ED Notes (Signed)
Dr. Goldston at bedside.  

## 2014-02-26 NOTE — ED Provider Notes (Signed)
CSN: [id number]     Arrival date & time 02/25/14  2125 History   First MD Initiated Contact with Patient 02/26/14 0057     Chief Complaint  Patient presents with  . Cough     (Consider location/radiation/quality/duration/timing/severity/associated sxs/prior Treatment) HPI 68 year old female presents immediately after being discharged from the floor. She was admitted to the stepdown unit and transferred to the floor for a infection. She was having trouble swallowing and eating and drinking and had to be admitted for IV fluids, antibiotics, and airway observation. The patient states she's been having a cough since this all started is not worsened. She has chronic dyspnea with sleep apnea and states she is still dyspneic. When she was discharged his afternoon because she is not feeling well she came straight to the ER in hopes of being readmitted release having a better expedition for why she is discharged. Her breathing has improved since she was admitted a couple days ago. She's now able to swallow and speak normally. Her neck swelling has decreased.  Past Medical History  Diagnosis Date  . Mixed hyperlipidemia   . Osteoarthritis   . GERD (gastroesophageal reflux disease) 1995  . Asthma 2010  . Anxiety   . Depression   . Hoarseness   . Obstructive sleep apnea     using a cpap-severe  . Wears glasses    Past Surgical History  Procedure Laterality Date  . Rotator cuff repair Right 2005  . Cholecystectomy  1985   Family History  Problem Relation Age of Onset  . Cancer Father   . Cancer Mother   . Heart attack Mother   . Cancer Brother    History  Substance Use Topics  . Smoking status: Never Smoker   . Smokeless tobacco: Never Used  . Alcohol Use: No   OB History   Grav Para Term Preterm Abortions TAB SAB Ect Mult Living                 Review of Systems  HENT: Positive for sore throat. Negative for trouble swallowing and voice change.   Respiratory: Positive for cough  and shortness of breath.   Musculoskeletal: Positive for neck pain.  All other systems reviewed and are negative.     Allergies  Cephalexin; Omeprazole; and Rofecoxib  Home Medications   Prior to Admission medications   Medication Sig Start Date End Date Taking? Authorizing Provider  albuterol (PROVENTIL HFA;VENTOLIN HFA) 108 (90 BASE) MCG/ACT inhaler Inhale 2 puffs into the lungs every 6 (six) hours as needed for wheezing or shortness of breath. 07/29/13  Yes Fayrene Helper, MD  FLUoxetine (PROZAC) 20 MG capsule Take 1 capsule (20 mg total) by mouth daily. 06/17/13  Yes Fayrene Helper, MD  furosemide (LASIX) 40 MG tablet Take 1 tablet (40 mg total) by mouth daily. 01/05/14  Yes Fayrene Helper, MD  potassium chloride SA (K-DUR,KLOR-CON) 20 MEQ tablet Take 20 mEq by mouth 2 (two) times daily.   Yes Historical Provider, MD  guaiFENesin-dextromethorphan (ROBITUSSIN DM) 100-10 MG/5ML syrup Take 5 mLs by mouth every 4 (four) hours as needed for cough. 02/25/14   Delfina Redwood, MD  ibuprofen (ADVIL,MOTRIN) 200 MG tablet Take 2 tablets (400 mg total) by mouth 3 (three) times daily with meals. For 2 - 3 days then as needed 02/25/14   Delfina Redwood, MD  penicillin v potassium (VEETID) 500 MG tablet Take 1 tablet (500 mg total) by mouth 3 (three) times daily. Until  gone 02/25/14   Delfina Redwood, MD   BP 117/62  Pulse 81  Temp(Src) 98 F (36.7 C) (Oral)  Resp 18  SpO2 99% Physical Exam  Nursing note and vitals reviewed. Constitutional: She is oriented to person, place, and time. She appears well-developed and well-nourished.  HENT:  Head: Normocephalic and atraumatic.  Right Ear: External ear normal.  Left Ear: External ear normal.  Nose: Nose normal.  Mouth/Throat: No oropharyngeal exudate.  Airway patent, no swelling  Eyes: Right eye exhibits no discharge. Left eye exhibits no discharge.  Neck:  Mild right neck tenderness, no significant swelling  Cardiovascular:  Normal rate, regular rhythm and normal heart sounds.   Pulmonary/Chest: Effort normal and breath sounds normal. She has no wheezes.  Abdominal: Soft. There is no tenderness.  Neurological: She is alert and oriented to person, place, and time.  Skin: Skin is warm and dry.    ED Course  Procedures (including critical care time) Labs Review Labs Reviewed  CBC WITH DIFFERENTIAL - Abnormal; Notable for the following:    WBC 28.8 (*)    MCV 73.2 (*)    Neutrophils Relative % 87 (*)    Lymphocytes Relative 8 (*)    Neutro Abs 25.0 (*)    Monocytes Absolute 1.2 (*)    Basophils Absolute 0.3 (*)    All other components within normal limits  COMPREHENSIVE METABOLIC PANEL - Abnormal; Notable for the following:    Glucose, Bld 112 (*)    Total Protein 8.6 (*)    Albumin 2.5 (*)    ALT 64 (*)    Alkaline Phosphatase 203 (*)    GFR calc non Af Amer 68 (*)    GFR calc Af Amer 79 (*)    All other components within normal limits  PRO B NATRIURETIC PEPTIDE - Abnormal; Notable for the following:    Pro B Natriuretic peptide (BNP) 344.0 (*)    All other components within normal limits    Imaging Review Dg Chest 2 View  02/25/2014   CLINICAL DATA:  Productive cough with shortness of breath and chest pain.  EXAM: CHEST  2 VIEW  COMPARISON:  Chest x-ray dated 04/24/2014  FINDINGS: There is new partial atelectasis of the right lower lobe with new elevation of the right hemidiaphragm. Heart size and pulmonary vascularity are normal. Left lung is clear. No osseous abnormality. No there appears to be a tiny left effusion on the lateral view.  IMPRESSION: New partial atelectasis in the right lower lobe. New tiny left effusion.   Electronically Signed   By: Rozetta Nunnery M.D.   On: 02/25/2014 23:28     EKG Interpretation None      MDM   Final diagnoses:  Cough    Patient appears well here, has no increased work of breathing, hypoxia, or signs of acute illness. She is rapidly improving from her  admission a couple days ago. It sounds like she had posttussive dyspnea this is now resolved. I discussed her with the hospital's on-call, Dr. Barbaraann Faster, who is come to evaluate the patient, discussed management options, and they have agreed on discharge. He is written her prescriptions and she will be discharged as previously planned.    Ephraim Hamburger, MD 02/26/14 [phone]

## 2014-02-26 NOTE — ED Notes (Signed)
Pt reports she was admitted upstairs for cough and infection. Pt reports she was upstairs and getting ready to be discharged home when she told the staff she could not breath. Staff brought her downstairs to be evaluated. Pt reports dyspnea at rest and cough. Pt reports cp 4/10.

## 2014-02-26 NOTE — Consult Note (Signed)
[facility] Consult Note  Angela Chase ERD:[id number] DOB: [birth year] DOA: 02/26/2014  Referring physician: Dr. Regenia Skeeter PCP: Tula Nakayama, MD   Chief Complaint: sore throat  HPI: Angela Chase is a 68 y.o. female  Pt discharged on 02/25/14 from the floor yesterday. Had a coughing episode and felt short of breath for a couple minutes after coughing. She had already been given her discharge paperwork and was told by her discharging nurse that she would have to go to the emergency room if she needed to be seen. She went to the ED and was seen by the ED physician who asked for me to come evaluate the pt.   A/P 57DJ AAF discharged after 2 days of therapy for strep throat adn soft tissue swelling w/ GERD, HLD, OSA, anxiety/depression.  Pt w/ pharyngitis due to Strep A beta hemolytic strep.   The pt was mostly looking for answers to some of her lingering symptoms, such as why she is coughing and why it causes her to get short of breath if she coughs so violently. She denied any lingering SOB, WHeezing, CP, HA, dysphagia. The disease process of Strep throat, tissue swelling and damage and healing was explained at great length. I took several minutes to talk about symptoms that are often associated w/ violent rapid fire coughing episodes including nausea, diaphoresis, SOB, throat pain, and sometimes lightheadedness. The pt states that she does not have any albuterol at home. It is currently 03:00. THe pt was given prednisone 40mg  (third dose since admission), and a duoneb. I gave her prescriptions for albuterol, a spacer, prednisone 50mg  (2 more days), and diflucan in case she gets a fungal infection. The pt expressed sincere gratitude and will follow the instructions given her. She understands that her cough could last for several weeks. She will try the cough medication prescribed her at time of discharge. Pt to use CPAP tonight to sleep  Previous H&P and Discharge summary  reviewed in full   Review of Systems:  Per HPI w/ all other systems neg.     Past Medical History  Diagnosis Date  . Mixed hyperlipidemia   . Osteoarthritis   . GERD (gastroesophageal reflux disease) 1995  . Asthma 2010  . Anxiety   . Depression   . Hoarseness   . Obstructive sleep apnea     using a cpap-severe  . Wears glasses    Past Surgical History  Procedure Laterality Date  . Rotator cuff repair Right 2005  . Cholecystectomy  1985   Social History:  reports that she has never smoked. She has never used smokeless tobacco. She reports that she does not drink alcohol or use illicit drugs.  Allergies  Allergen Reactions  . Cephalexin Hives  . Omeprazole Hives  . Rofecoxib Rash    Family History  Problem Relation Age of Onset  . Cancer Father   . Cancer Mother   . Heart attack Mother   . Cancer Brother      Prior to Admission medications   Medication Sig Start Date End Date Taking? Authorizing Provider  albuterol (PROVENTIL HFA;VENTOLIN HFA) 108 (90 BASE) MCG/ACT inhaler Inhale 2 puffs into the lungs every 6 (six) hours as needed for wheezing or shortness of breath. 07/29/13  Yes Fayrene Helper, MD  FLUoxetine (PROZAC) 20 MG capsule Take 1 capsule (20 mg total) by mouth daily. 06/17/13  Yes Fayrene Helper, MD  furosemide (LASIX) 40 MG tablet Take 1 tablet (40 mg total) by mouth  daily. 01/05/14  Yes Fayrene Helper, MD  potassium chloride SA (K-DUR,KLOR-CON) 20 MEQ tablet Take 20 mEq by mouth 2 (two) times daily.   Yes Historical Provider, MD  guaiFENesin-dextromethorphan (ROBITUSSIN DM) 100-10 MG/5ML syrup Take 5 mLs by mouth every 4 (four) hours as needed for cough. 02/25/14   Delfina Redwood, MD  ibuprofen (ADVIL,MOTRIN) 200 MG tablet Take 2 tablets (400 mg total) by mouth 3 (three) times daily with meals. For 2 - 3 days then as needed 02/25/14   Delfina Redwood, MD  penicillin v potassium (VEETID) 500 MG tablet Take 1 tablet (500 mg total) by mouth 3  (three) times daily. Until gone 02/25/14   Delfina Redwood, MD   Physical Exam: Filed Vitals:   02/25/14 2138  BP: 117/62  Pulse: 81  Temp: 98 F (36.7 C)  TempSrc: Oral  Resp: 18  SpO2: 99%    Wt Readings from Last 3 Encounters:  02/22/14 104.327 kg (230 lb)  02/09/14 114.216 kg (251 lb 12.8 oz)  10/26/13 111.131 kg (245 lb)    General: Appears calm and comfortable Eyes: PERRL, normal lids, irises & conjunctiva ENT: grossly normal hearing, lips & tongue Neck: nml ROM, no LAD, masses or thyromegaly Cardiovascular: RRR, no m/r/g. No LE edema. Telemetry: SR, no arrhythmias  Respiratory: CTA bilaterally, no w/r/r. Normal respiratory effort. Abdomen: soft, ntnd Skin: no rash or induration seen on limited exam Musculoskeletal: grossly normal tone BUE/BLE Psychiatric: grossly normal mood and affect, speech fluent and appropriate Neurologic: grossly non-focal.          Labs on Admission:  Basic Metabolic Panel:  Recent Labs Lab 02/22/14 2342 02/23/14 0500 02/25/14 2207  NA 133* 136* 137  K 3.8 3.7 4.5  CL 96 98 99  CO2 22 22 24   GLUCOSE 129* 140* 112*  BUN 7 6 15   CREATININE 0.71 0.69 0.87  CALCIUM 9.3 8.9 9.2   Liver Function Tests:  Recent Labs Lab 02/23/14 0500 02/25/14 2207  AST 55* 34  ALT 94* 64*  ALKPHOS 233* 203*  BILITOT 1.5* 0.4  PROT 8.0 8.6*  ALBUMIN 2.8* 2.5*   No results found for this basename: LIPASE, AMYLASE,  in the last 168 hours No results found for this basename: AMMONIA,  in the last 168 hours CBC:  Recent Labs Lab 02/22/14 2342 02/23/14 0500 02/25/14 2207  WBC 24.7* 33.1* 28.8*  NEUTROABS 20.7*  --  25.0*  HGB 12.6 12.7 13.4  HCT 35.3* 36.0 37.4  MCV 72.5* 72.7* 73.2*  PLT 241 229 284   Cardiac Enzymes: No results found for this basename: CKTOTAL, CKMB, CKMBINDEX, TROPONINI,  in the last 168 hours  BNP (last 3 results)  Recent Labs  02/25/14 2207  PROBNP 344.0*   CBG:  Recent Labs Lab 02/24/14 1703  02/24/14 2122 02/25/14 0622 02/25/14 1149 02/25/14 1657  GLUCAP 121* 135* 92 135* 153*    Radiological Exams on Admission: Dg Chest 2 View  02/25/2014   CLINICAL DATA:  Productive cough with shortness of breath and chest pain.  EXAM: CHEST  2 VIEW  COMPARISON:  Chest x-ray dated 04/24/2014  FINDINGS: There is new partial atelectasis of the right lower lobe with new elevation of the right hemidiaphragm. Heart size and pulmonary vascularity are normal. Left lung is clear. No osseous abnormality. No there appears to be a tiny left effusion on the lateral view.  IMPRESSION: New partial atelectasis in the right lower lobe. New tiny left effusion.   Electronically  Signed   By: Rozetta Nunnery M.D.   On: 02/25/2014 23:28     Time spent: > 60 min in direct pt care and coordination  [place], [facility] [email or web address] Password [email or web address]

## 2014-03-01 LAB — CULTURE, BLOOD (ROUTINE X 2)
CULTURE: NO GROWTH
CULTURE: NO GROWTH

## 2014-03-04 ENCOUNTER — Ambulatory Visit (INDEPENDENT_AMBULATORY_CARE_PROVIDER_SITE_OTHER): Payer: Medicare Other | Admitting: Family Medicine

## 2014-03-04 ENCOUNTER — Encounter: Payer: Self-pay | Admitting: Family Medicine

## 2014-03-04 VITALS — BP 122/84 | HR 100 | Resp 20 | Ht 66.0 in | Wt 235.1 lb

## 2014-03-04 DIAGNOSIS — E785 Hyperlipidemia, unspecified: Secondary | ICD-10-CM

## 2014-03-04 DIAGNOSIS — F329 Major depressive disorder, single episode, unspecified: Secondary | ICD-10-CM

## 2014-03-04 DIAGNOSIS — J45909 Unspecified asthma, uncomplicated: Secondary | ICD-10-CM

## 2014-03-04 DIAGNOSIS — Z6841 Body Mass Index (BMI) 40.0 and over, adult: Secondary | ICD-10-CM

## 2014-03-04 DIAGNOSIS — Z09 Encounter for follow-up examination after completed treatment for conditions other than malignant neoplasm: Secondary | ICD-10-CM

## 2014-03-04 DIAGNOSIS — R42 Dizziness and giddiness: Secondary | ICD-10-CM

## 2014-03-04 DIAGNOSIS — Z8619 Personal history of other infectious and parasitic diseases: Secondary | ICD-10-CM

## 2014-03-04 DIAGNOSIS — Z8709 Personal history of other diseases of the respiratory system: Secondary | ICD-10-CM

## 2014-03-04 DIAGNOSIS — F32A Depression, unspecified: Secondary | ICD-10-CM

## 2014-03-04 DIAGNOSIS — J020 Streptococcal pharyngitis: Secondary | ICD-10-CM

## 2014-03-04 MED ORDER — PENICILLIN V POTASSIUM 500 MG PO TABS: 500.0000 mg | ORAL_TABLET | Freq: Three times a day (TID) | ORAL | Status: DC

## 2014-03-04 MED ORDER — FLUOXETINE HCL 20 MG PO CAPS: 20.0000 mg | ORAL_CAPSULE | Freq: Every day | ORAL | Status: DC

## 2014-03-04 MED ORDER — MECLIZINE HCL 12.5 MG PO TABS: 12.5000 mg | ORAL_TABLET | Freq: Three times a day (TID) | ORAL | Status: DC | PRN

## 2014-03-04 NOTE — Progress Notes (Signed)
   Subjective:    Patient ID: Angela Chase, female    DOB: Nov 10, 1945, 68 y.o.   MRN: [id number]  HPI Pt in for f/u recent hospitalization for sever strep infection with persistent marked leukocytosis, and chills C/o wheezing and still has no neb treatment at home Increased and anxiety, paranoia and depression at visit, had stopped fluoxetine but even she realizes that her behavior is "not her norm" and will resume med with close f/u   Review of Systems See HPI   Denies chest pains, palpitations and leg swelling Denies abdominal pain, nausea, vomiting,diarrhea or constipation.   Denies dysuria, frequency, hesitancy or incontinence. Denies joint pain, swelling and limitation in mobility. Denies headaches, seizures, numbness, or tingling.  Denies skin break down or rash.        Objective:   Physical Exam BP 122/84 mmHg  Pulse 100  Resp 20  Ht 5\' 6"  (1.676 m)  Wt 235 lb 1.9 oz (106.65 kg)  BMI 37.97 kg/m2  SpO2 98% Patient alert and oriented and in no cardiopulmonary distress.  HEENT: No facial asymmetry, EOMI,   oropharynx pink and moist.  Neck supple no JVD, no mass.  Chest: decreased air entry, few wheezes, no crackles  CVS: S1, S2 no murmurs, no S3.Regular rate.  ABD: Soft non tender.   Ext: No edema  MS: Adequate ROM spine, shoulders, hips and knees.  Skin: Intact, no ulcerations or rash noted.  Psych: Good eye contact, anxious, pressured tangential;  speech, multiple complaints re recent hospital care CNS: CN 2-12 intact, power,  normal throughout.no focal deficits noted.        Assessment & Plan:  H/O streptococcal pharyngitis Improved, but still experiencing sore thraot, denies fever or chills currently starting to tolerate oral die again Hospitalized for 3 days    Hospital discharge follow-up Recently hospitalized with severe leukocytosis and strep infection, doing better, will need rept wbc, pt aware of need to complete antibiotics  prescribed in out pt setting   Hyperlipemia Dyslipidemia with abn LFT Hyperlipidemia:Low fat diet discussed and encouraged.  Updated lab needed at/ before next visit.    Morbid obesity with BMI of 40.0-44.9, adult Deteriorated. Patient re-educated about  the importance of commitment to a  minimum of 150 minutes of exercise per week. The importance of healthy food choices with portion control discussed. Encouraged to start a food diary, count calories and to consider  joining a support group. Sample diet sheets offered. Goals set by the patient for the next several months.      Depression Deteriorated, somewhat confused at visit , pressure of speech and flight of ideas, will resume medication  Not suicidal or homicidal F/u in 4 to 6 weeks needed   Intrinsic asthma Still reports not  having neb treatments , will ensure follow through so that pt gets the supplies that she needs

## 2014-03-04 NOTE — Patient Instructions (Addendum)
F/u in 4 weeks, please  call if you need me before  Penicillin is prescribed for 1 week, and also nebulizing solution to use 3 times daily, as needed, for wheezing   Resume fluoxetine 20 mg one daily  No labs are due today  Please CALL if you need help

## 2014-03-09 ENCOUNTER — Telehealth: Payer: Self-pay | Admitting: Family Medicine

## 2014-03-09 NOTE — Telephone Encounter (Signed)
Noted.  Will sent letter to patient.

## 2014-03-17 ENCOUNTER — Telehealth: Payer: Self-pay | Admitting: *Deleted

## 2014-03-17 NOTE — Telephone Encounter (Signed)
Pt called [facility] stating she needs her nebulizer refilled to [facility] or [facility], pt would like a returned call so she will know which pharmacy medication was sent. Please advise

## 2014-03-17 NOTE — Telephone Encounter (Signed)
i don't see this on her med list

## 2014-03-18 NOTE — Telephone Encounter (Signed)
Called and left message for patient to return call.  

## 2014-04-01 ENCOUNTER — Ambulatory Visit (INDEPENDENT_AMBULATORY_CARE_PROVIDER_SITE_OTHER): Payer: Medicare Other | Admitting: Family Medicine

## 2014-04-01 ENCOUNTER — Encounter: Payer: Self-pay | Admitting: Family Medicine

## 2014-04-01 ENCOUNTER — Ambulatory Visit (INDEPENDENT_AMBULATORY_CARE_PROVIDER_SITE_OTHER): Payer: Medicare Other

## 2014-04-01 VITALS — BP 130/72 | HR 86 | Resp 18 | Ht 65.0 in | Wt 243.1 lb

## 2014-04-01 DIAGNOSIS — Z23 Encounter for immunization: Secondary | ICD-10-CM

## 2014-04-01 DIAGNOSIS — M174 Other bilateral secondary osteoarthritis of knee: Secondary | ICD-10-CM

## 2014-04-01 DIAGNOSIS — F32A Depression, unspecified: Secondary | ICD-10-CM

## 2014-04-01 DIAGNOSIS — J45909 Unspecified asthma, uncomplicated: Secondary | ICD-10-CM

## 2014-04-01 DIAGNOSIS — R32 Unspecified urinary incontinence: Secondary | ICD-10-CM | POA: Insufficient documentation

## 2014-04-01 DIAGNOSIS — E785 Hyperlipidemia, unspecified: Secondary | ICD-10-CM

## 2014-04-01 DIAGNOSIS — N3941 Urge incontinence: Secondary | ICD-10-CM

## 2014-04-01 DIAGNOSIS — Z6841 Body Mass Index (BMI) 40.0 and over, adult: Secondary | ICD-10-CM

## 2014-04-01 DIAGNOSIS — E559 Vitamin D deficiency, unspecified: Secondary | ICD-10-CM | POA: Insufficient documentation

## 2014-04-01 DIAGNOSIS — F329 Major depressive disorder, single episode, unspecified: Secondary | ICD-10-CM

## 2014-04-01 LAB — CBC WITH DIFFERENTIAL/PLATELET
Basophils Absolute: 0 10*3/uL (ref 0.0–0.1)
Basophils Relative: 1 % (ref 0–1)
Eosinophils Absolute: 0.2 10*3/uL (ref 0.0–0.7)
Eosinophils Relative: 5 % (ref 0–5)
HEMATOCRIT: 40.7 % (ref 36.0–46.0)
Hemoglobin: 13.2 g/dL (ref 12.0–15.0)
LYMPHS PCT: 35 % (ref 12–46)
Lymphs Abs: 1.6 10*3/uL (ref 0.7–4.0)
MCH: 25 pg — ABNORMAL LOW (ref 26.0–34.0)
MCHC: 32.4 g/dL (ref 30.0–36.0)
MCV: 77.1 fL — ABNORMAL LOW (ref 78.0–100.0)
MONO ABS: 0.4 10*3/uL (ref 0.1–1.0)
Monocytes Relative: 9 % (ref 3–12)
NEUTROS ABS: 2.4 10*3/uL (ref 1.7–7.7)
Neutrophils Relative %: 50 % (ref 43–77)
Platelets: 247 10*3/uL (ref 150–400)
RBC: 5.28 MIL/uL — ABNORMAL HIGH (ref 3.87–5.11)
RDW: 15.1 % (ref 11.5–15.5)
WBC: 4.7 10*3/uL (ref 4.0–10.5)

## 2014-04-01 MED ORDER — METHYLPREDNISOLONE ACETATE 80 MG/ML IJ SUSP
80.0000 mg | Freq: Once | INTRAMUSCULAR | Status: AC
  Administered 2014-04-01: 80 mg via INTRAMUSCULAR

## 2014-04-01 MED ORDER — ERGOCALCIFEROL 1.25 MG (50000 UT) PO CAPS: 50000.0000 [IU] | ORAL_CAPSULE | ORAL | Status: DC

## 2014-04-01 MED ORDER — KETOROLAC TROMETHAMINE 60 MG/2ML IM SOLN
60.0000 mg | Freq: Once | INTRAMUSCULAR | Status: AC
  Administered 2014-04-01: 60 mg via INTRAMUSCULAR

## 2014-04-01 MED ORDER — SOLIFENACIN SUCCINATE 5 MG PO TABS: 5.0000 mg | ORAL_TABLET | Freq: Every day | ORAL | Status: DC

## 2014-04-01 MED ORDER — PREDNISONE (PAK) 5 MG PO TABS
5.0000 mg | ORAL_TABLET | ORAL | Status: DC
Start: 2014-04-01 — End: 2014-07-26

## 2014-04-01 NOTE — Progress Notes (Signed)
   Subjective:    Patient ID: Angela Chase, female    DOB: May 31, 1945, 68 y.o.   MRN: [id number]  HPI The PT is here for follow up and re-evaluation of chronic medical conditions, medication management and review of any available recent lab and radiology data.  Preventive health is updated, specifically  Cancer screening and Immunization.   Questions or concerns regarding consultations or procedures which the PT has had in the interim are  addressed. The PT denies any adverse reactions to current medications since the last visit.  C/o urinary in ncontinence with wetting, needs to wear a pad, no dysuria C/o increased knee pain  Needs med for this Depression and mood much improved      Review of Systems See HPI Denies recent fever or chills. Denies sinus pressure, nasal congestion, ear pain or sore throat. Denies chest congestion, productive cough or wheezing. Denies chest pains, palpitations and leg swelling Denies abdominal pain, nausea, vomiting,diarrhea or constipation.   Denies dysuria, frequency, hesitancy .  Denies headaches, seizures, numbness, or tingling. Denies uncontrolled depression, anxiety or insomnia. Denies skin break down or rash.        Objective:   Physical Exam BP 130/72 mmHg  Pulse 86  Resp 18  Ht 5\' 5"  (1.651 m)  Wt 243 lb 1.3 oz (110.26 kg)  BMI 40.45 kg/m2  SpO2 95% Patient alert and oriented and in no cardiopulmonary distress.  HEENT: No facial asymmetry, EOMI,   oropharynx pink and moist.  Neck supple no JVD, no mass.  Chest: Clear to auscultation bilaterally.  CVS: S1, S2 no murmurs, no S3.Regular rate.  ABD: Soft non tender.   Ext: No edema  MS: decreased  ROM spine, shoulders, hips and knees.deformity and swelling of knees, with crepitus, right more than left  Skin: Intact, no ulcerations or rash noted.  Psych: Good eye contact, normal affect. Memory intact not anxious or depressed appearing.  CNS: CN 2-12 intact, power,   normal throughout.no focal deficits noted.        Assessment & Plan:  Osteoarthritis of both knees Increased and uncontrolled rigth knee pain with instability, toradol and depo medrol in ioffice, prednisone dose pack and orhto referral  Need for prophylactic vaccination and inoculation against influenza Vaccine administered at visit.   Intrinsic asthma Controlled, no change in medication   Depression Marked improvement, pt to remain on fluoxetine as prescribed  Morbid obesity with BMI of 40.0-44.9, adult Deteriorated. Patient re-educated about  the importance of commitment to a  minimum of 150 minutes of exercise per week. The importance of healthy food choices with portion control discussed. Encouraged to start a food diary, count calories and to consider  joining a support group. Sample diet sheets offered. Goals set by the patient for the next several months.     Vitamin D deficiency Needs treatmentr, med prescribed  Urinary incontinence discussed pelvic strengthening exercises, voiding on schedule, start vesicare

## 2014-04-01 NOTE — Patient Instructions (Addendum)
F/u in 3.5 month, please call if you need me before  Flu vaccine today  Injections today for knee pain, right more than left  Prednisone sent in for 6 days for knee pain  You are referred to orthopedics re right knee pain   New for incontinence is vesicare one daily, also please start exercises to strengthen pelvis and empty bladder regularly, to reduce accidents  You are much better, stay on current medication , fluoxetine for mental health  Use tylenol 500mg  , up to 3 daily for arthritis pain and stop ibupriofen on a regular basis  Start once weekly vitamin D , you need this , also prescribed  Fasting lipid, chem 7 , tSH in 3.5 month

## 2014-04-01 NOTE — Assessment & Plan Note (Signed)
Increased and uncontrolled rigth knee pain with instability, toradol and depo medrol in ioffice, prednisone dose pack and orhto referral

## 2014-04-01 NOTE — Assessment & Plan Note (Signed)
Vaccine administered at visit.  

## 2014-04-03 NOTE — Assessment & Plan Note (Signed)
Needs treatmentr, med prescribed

## 2014-04-03 NOTE — Assessment & Plan Note (Signed)
Marked improvement, pt to remain on fluoxetine as prescribed

## 2014-04-03 NOTE — Assessment & Plan Note (Signed)
Deteriorated. Patient re-educated about  the importance of commitment to a  minimum of 150 minutes of exercise per week. The importance of healthy food choices with portion control discussed. Encouraged to start a food diary, count calories and to consider  joining a support group. Sample diet sheets offered. Goals set by the patient for the next several months.    

## 2014-04-03 NOTE — Assessment & Plan Note (Signed)
Controlled, no change in medication  

## 2014-04-03 NOTE — Assessment & Plan Note (Signed)
discussed pelvic strengthening exercises, voiding on schedule, start vesicare

## 2014-04-08 ENCOUNTER — Telehealth: Payer: Self-pay

## 2014-04-11 NOTE — Telephone Encounter (Signed)
Offer nurse visit for depo medrol 80 mg im to be followed by prednisone 10mg  dose pack for 6 days as sounds like asthma is flaring pls

## 2014-04-11 NOTE — Telephone Encounter (Signed)
Called patient and left message for them to return call at the office   

## 2014-04-12 NOTE — Telephone Encounter (Signed)
Called left message for patient to return call 

## 2014-04-14 NOTE — Telephone Encounter (Signed)
Unable to reach patient so I mailed a letter for her to call the office

## 2014-04-14 NOTE — Telephone Encounter (Signed)
Called patient and left message for them to return call at the office   

## 2014-05-05 ENCOUNTER — Other Ambulatory Visit: Payer: Self-pay | Admitting: Family Medicine

## 2014-06-19 DIAGNOSIS — Z09 Encounter for follow-up examination after completed treatment for conditions other than malignant neoplasm: Secondary | ICD-10-CM | POA: Insufficient documentation

## 2014-06-19 DIAGNOSIS — Z8709 Personal history of other diseases of the respiratory system: Secondary | ICD-10-CM | POA: Insufficient documentation

## 2014-06-19 NOTE — Assessment & Plan Note (Addendum)
Deteriorated, somewhat confused at visit , pressure of speech and flight of ideas, will resume medication  Not suicidal or homicidal F/u in 4 to 6 weeks needed

## 2014-06-19 NOTE — Assessment & Plan Note (Signed)
Improved, but still experiencing sore thraot, denies fever or chills currently starting to tolerate oral die again Hospitalized for 3 days

## 2014-06-19 NOTE — Assessment & Plan Note (Signed)
Deteriorated. Patient re-educated about  the importance of commitment to a  minimum of 150 minutes of exercise per week. The importance of healthy food choices with portion control discussed. Encouraged to start a food diary, count calories and to consider  joining a support group. Sample diet sheets offered. Goals set by the patient for the next several months.    

## 2014-06-19 NOTE — Assessment & Plan Note (Signed)
Still reports not  having neb treatments , will ensure follow through so that pt gets the supplies that she needs

## 2014-06-19 NOTE — Assessment & Plan Note (Addendum)
Recently hospitalized with severe leukocytosis and strep infection, doing better, will need rept wbc, pt aware of need to complete antibiotics prescribed in out pt setting

## 2014-06-19 NOTE — Assessment & Plan Note (Signed)
Dyslipidemia with abn LFT Hyperlipidemia:Low fat diet discussed and encouraged.  Updated lab needed at/ before next visit.

## 2014-07-01 ENCOUNTER — Other Ambulatory Visit: Payer: Self-pay

## 2014-07-01 MED ORDER — FUROSEMIDE 40 MG PO TABS: 40.0000 mg | ORAL_TABLET | Freq: Every day | ORAL | Status: DC

## 2014-07-15 ENCOUNTER — Telehealth: Payer: Self-pay | Admitting: Family Medicine

## 2014-07-15 MED ORDER — PHENTERMINE HCL 37.5 MG PO TABS: ORAL_TABLET | ORAL | Status: DC

## 2014-07-15 NOTE — Telephone Encounter (Signed)
Med refilled.

## 2014-07-26 ENCOUNTER — Telehealth: Payer: Self-pay | Admitting: Family Medicine

## 2014-07-26 ENCOUNTER — Ambulatory Visit (INDEPENDENT_AMBULATORY_CARE_PROVIDER_SITE_OTHER): Payer: PPO | Admitting: Family Medicine

## 2014-07-26 ENCOUNTER — Encounter: Payer: Self-pay | Admitting: Family Medicine

## 2014-07-26 VITALS — BP 134/82 | HR 82 | Resp 18 | Ht 65.0 in | Wt 263.0 lb

## 2014-07-26 DIAGNOSIS — Z23 Encounter for immunization: Secondary | ICD-10-CM

## 2014-07-26 DIAGNOSIS — F32A Depression, unspecified: Secondary | ICD-10-CM

## 2014-07-26 DIAGNOSIS — R600 Localized edema: Secondary | ICD-10-CM

## 2014-07-26 DIAGNOSIS — E785 Hyperlipidemia, unspecified: Secondary | ICD-10-CM | POA: Diagnosis not present

## 2014-07-26 DIAGNOSIS — J45909 Unspecified asthma, uncomplicated: Secondary | ICD-10-CM

## 2014-07-26 DIAGNOSIS — E559 Vitamin D deficiency, unspecified: Secondary | ICD-10-CM | POA: Diagnosis not present

## 2014-07-26 DIAGNOSIS — N3946 Mixed incontinence: Secondary | ICD-10-CM

## 2014-07-26 DIAGNOSIS — Z139 Encounter for screening, unspecified: Secondary | ICD-10-CM | POA: Diagnosis not present

## 2014-07-26 DIAGNOSIS — Z1231 Encounter for screening mammogram for malignant neoplasm of breast: Secondary | ICD-10-CM

## 2014-07-26 DIAGNOSIS — F329 Major depressive disorder, single episode, unspecified: Secondary | ICD-10-CM

## 2014-07-26 DIAGNOSIS — Z6841 Body Mass Index (BMI) 40.0 and over, adult: Secondary | ICD-10-CM

## 2014-07-26 LAB — CBC WITH DIFFERENTIAL/PLATELET
BASOS ABS: 0.1 10*3/uL (ref 0.0–0.1)
BASOS PCT: 1 % (ref 0–1)
Eosinophils Absolute: 0.2 10*3/uL (ref 0.0–0.7)
Eosinophils Relative: 3 % (ref 0–5)
HEMATOCRIT: 40.3 % (ref 36.0–46.0)
HEMOGLOBIN: 13.1 g/dL (ref 12.0–15.0)
LYMPHS ABS: 1.4 10*3/uL (ref 0.7–4.0)
LYMPHS PCT: 28 % (ref 12–46)
MCH: 26 pg (ref 26.0–34.0)
MCHC: 32.5 g/dL (ref 30.0–36.0)
MCV: 80 fL (ref 78.0–100.0)
MONO ABS: 0.5 10*3/uL (ref 0.1–1.0)
MPV: 9.7 fL (ref 8.6–12.4)
Monocytes Relative: 10 % (ref 3–12)
NEUTROS ABS: 3 10*3/uL (ref 1.7–7.7)
NEUTROS PCT: 58 % (ref 43–77)
Platelets: 236 10*3/uL (ref 150–400)
RBC: 5.04 MIL/uL (ref 3.87–5.11)
RDW: 14.3 % (ref 11.5–15.5)
WBC: 5.1 10*3/uL (ref 4.0–10.5)

## 2014-07-26 MED ORDER — POTASSIUM CHLORIDE CRYS ER 20 MEQ PO TBCR: 20.0000 meq | EXTENDED_RELEASE_TABLET | Freq: Two times a day (BID) | ORAL | Status: DC

## 2014-07-26 MED ORDER — FUROSEMIDE 40 MG PO TABS: 40.0000 mg | ORAL_TABLET | Freq: Every day | ORAL | Status: DC

## 2014-07-26 NOTE — Telephone Encounter (Signed)
Patient advised that if it happens again to call the office for an appt

## 2014-07-26 NOTE — Telephone Encounter (Signed)
Work in at [employer]

## 2014-07-26 NOTE — Patient Instructions (Signed)
Initial medicare wellness visit in 2.5 month, call if you ned me before  Start lasix, potassium, fluoxetine, vit D and vesicare and albuterol every day as prescribed  Prevnar today  Fasting labs today  You are referred for diagnostic/ screening mammogram April 3 or after,never Wednesday  It is important that you exercise regularly at least 30 minutes 5 times a week. If you develop chest pain, have severe difficulty breathing, or feel very tired, stop exercising immediately and seek medical attention   A healthy diet is rich in fruit, vegetables and whole grains. Poultry fish, nuts and beans are a healthy choice for protein rather then red meat. A low sodium diet and drinking 64 ounces of water daily is generally recommended. Oils and sweet should be limited. Carbohydrates especially for those who are diabetic or overweight, should be limited to 60 to45 gram per meal. It is important to eat on a regular schedule, at least 3 times daily. Snacks should be primarily fruits, vegetables or nuts.

## 2014-07-27 ENCOUNTER — Other Ambulatory Visit: Payer: Self-pay

## 2014-07-27 ENCOUNTER — Ambulatory Visit: Payer: BC Managed Care – PPO | Admitting: Family Medicine

## 2014-07-27 DIAGNOSIS — N3941 Urge incontinence: Secondary | ICD-10-CM

## 2014-07-27 DIAGNOSIS — J9801 Acute bronchospasm: Secondary | ICD-10-CM

## 2014-07-27 LAB — VITAMIN D 25 HYDROXY (VIT D DEFICIENCY, FRACTURES): Vit D, 25-Hydroxy: 29 ng/mL — ABNORMAL LOW (ref 30–100)

## 2014-07-27 LAB — COMPREHENSIVE METABOLIC PANEL
ALBUMIN: 4.1 g/dL (ref 3.5–5.2)
ALT: 33 U/L (ref 0–35)
AST: 31 U/L (ref 0–37)
Alkaline Phosphatase: 137 U/L — ABNORMAL HIGH (ref 39–117)
BILIRUBIN TOTAL: 1 mg/dL (ref 0.2–1.2)
BUN: 10 mg/dL (ref 6–23)
CALCIUM: 9.6 mg/dL (ref 8.4–10.5)
CHLORIDE: 105 meq/L (ref 96–112)
CO2: 25 meq/L (ref 19–32)
CREATININE: 0.58 mg/dL (ref 0.50–1.10)
GLUCOSE: 80 mg/dL (ref 70–99)
Potassium: 4 mEq/L (ref 3.5–5.3)
Sodium: 143 mEq/L (ref 135–145)
Total Protein: 7.8 g/dL (ref 6.0–8.3)

## 2014-07-27 LAB — LIPID PANEL
CHOL/HDL RATIO: 2.7 ratio
Cholesterol: 220 mg/dL — ABNORMAL HIGH (ref 0–200)
HDL: 81 mg/dL (ref >=46)
LDL CALC: 126 mg/dL — AB (ref 0–99)
Triglycerides: 63 mg/dL (ref <150)
VLDL: 13 mg/dL (ref 0–40)

## 2014-07-27 LAB — HEMOGLOBIN A1C
Hgb A1c MFr Bld: 5.5 % (ref <5.7)
Mean Plasma Glucose: 111 mg/dL (ref <117)

## 2014-07-27 LAB — TSH: TSH: 2.765 u[IU]/mL (ref 0.350–4.500)

## 2014-07-27 MED ORDER — FLUOXETINE HCL 20 MG PO CAPS: 20.0000 mg | ORAL_CAPSULE | Freq: Every day | ORAL | Status: DC

## 2014-07-27 MED ORDER — ALBUTEROL SULFATE HFA 108 (90 BASE) MCG/ACT IN AERS: 2.0000 | INHALATION_SPRAY | Freq: Four times a day (QID) | RESPIRATORY_TRACT | Status: DC | PRN

## 2014-07-27 MED ORDER — SOLIFENACIN SUCCINATE 5 MG PO TABS: 5.0000 mg | ORAL_TABLET | Freq: Every day | ORAL | Status: DC

## 2014-07-27 MED ORDER — VITAMIN D (ERGOCALCIFEROL) 1.25 MG (50000 UNIT) PO CAPS: ORAL_CAPSULE | ORAL | Status: DC

## 2014-08-03 ENCOUNTER — Telehealth: Payer: Self-pay | Admitting: Family Medicine

## 2014-08-03 NOTE — Telephone Encounter (Signed)
Returned patient's call.  Unable to leave message due to mailbox being full.

## 2014-08-11 ENCOUNTER — Ambulatory Visit: Payer: Medicare Other | Admitting: Pulmonary Disease

## 2014-08-19 ENCOUNTER — Encounter: Payer: Self-pay | Admitting: Family Medicine

## 2014-08-19 ENCOUNTER — Ambulatory Visit (INDEPENDENT_AMBULATORY_CARE_PROVIDER_SITE_OTHER): Payer: PPO | Admitting: Family Medicine

## 2014-08-19 VITALS — BP 134/80 | HR 92 | Resp 16 | Ht 65.0 in | Wt 264.0 lb

## 2014-08-19 DIAGNOSIS — F329 Major depressive disorder, single episode, unspecified: Secondary | ICD-10-CM

## 2014-08-19 DIAGNOSIS — M174 Other bilateral secondary osteoarthritis of knee: Secondary | ICD-10-CM | POA: Diagnosis not present

## 2014-08-19 DIAGNOSIS — M170 Bilateral primary osteoarthritis of knee: Secondary | ICD-10-CM

## 2014-08-19 DIAGNOSIS — J4520 Mild intermittent asthma, uncomplicated: Secondary | ICD-10-CM | POA: Diagnosis not present

## 2014-08-19 DIAGNOSIS — Z6841 Body Mass Index (BMI) 40.0 and over, adult: Secondary | ICD-10-CM

## 2014-08-19 DIAGNOSIS — R600 Localized edema: Secondary | ICD-10-CM

## 2014-08-19 DIAGNOSIS — F32A Depression, unspecified: Secondary | ICD-10-CM

## 2014-08-19 DIAGNOSIS — G4733 Obstructive sleep apnea (adult) (pediatric): Secondary | ICD-10-CM

## 2014-08-19 MED ORDER — MONTELUKAST SODIUM 10 MG PO TABS: 10.0000 mg | ORAL_TABLET | Freq: Every day | ORAL | Status: DC

## 2014-08-19 MED ORDER — MOMETASONE FUROATE 50 MCG/ACT NA SUSP: 2.0000 | Freq: Every day | NASAL | Status: DC

## 2014-08-19 MED ORDER — PREDNISONE 5 MG (21) PO TBPK: 5.0000 mg | ORAL_TABLET | ORAL | Status: DC

## 2014-08-19 MED ORDER — DICLOFENAC SODIUM 1 % TD GEL: TRANSDERMAL | Status: DC

## 2014-08-19 MED ORDER — METHYLPREDNISOLONE ACETATE 80 MG/ML IJ SUSP
80.0000 mg | Freq: Once | INTRAMUSCULAR | Status: AC
  Administered 2014-08-19: 80 mg via INTRAMUSCULAR

## 2014-08-19 NOTE — Patient Instructions (Addendum)
Welcome to medicare in May as before  Injection today for knee and medication sent , also you are referred to Dr Aline Brochure  Medications for allergies, singulair and nasonex  Blood pressure is excellent  Thanks for choosing [facility], we consider it a privelige to serve you.

## 2014-08-20 DIAGNOSIS — Z23 Encounter for immunization: Secondary | ICD-10-CM | POA: Insufficient documentation

## 2014-08-20 DIAGNOSIS — R600 Localized edema: Secondary | ICD-10-CM | POA: Insufficient documentation

## 2014-08-20 NOTE — Assessment & Plan Note (Signed)
Controlled, no change in medication  

## 2014-08-20 NOTE — Progress Notes (Signed)
   Angela Chase     MRN: [id number]      DOB: June 24, 1945   HPI Angela Chase is here with main c/o increased bilateral leg swelling over past 1 to 2 weeks, and  for follow up and re-evaluation of chronic medical conditions, medication management and review of any available recent lab and radiology data.  Preventive health is updated, specifically  Cancer screening and Immunization.   Questions or concerns regarding consultations or procedures which the PT has had in the interim are  addressed. The PT denies any adverse reactions to current medications since the last visit.    ROS Denies recent fever or chills. Denies sinus pressure, nasal congestion, ear pain or sore throat. Denies chest congestion, productive cough or wheezing. Denies chest pains, palpitations, PND or orthopnea. C/o bilateral leg swelling Denies abdominal pain, nausea, vomiting,diarrhea or constipation.   Denies dysuria, frequency, hesitancy or incontinence. Chronic  joint pain, swelling and limitation in mobility. Denies headaches, seizures, numbness, or tingling. Denies depression, anxiety or insomnia. Denies skin break down or rash.   PE  BP 134/82 mmHg  Pulse 82  Resp 18  Ht 5\' 5"  (1.651 m)  Wt 263 lb 0.6 oz (119.314 kg)  BMI 43.77 kg/m2  SpO2 98%  Patient alert and oriented and in no cardiopulmonary distress.  HEENT: No facial asymmetry, EOMI,   oropharynx pink and moist.  Neck supple no JVD, no mass.  Chest: Clear to auscultation bilaterally.  CVS: S1, S2 no murmurs, no S3.Regular rate.  ABD: Soft non tender.   Ext: 2 plus pitting  edema  GU:[id number] ROM spine, shoulders, hips and knees.  Skin: Intact, no ulcerations or rash noted.  Psych: Good eye contact, normal affect. Memory intact mildly  anxious or depressed appearing.  CNS: CN 2-12 intact, power,  normal throughout.no focal deficits noted.   Assessment & Plan   Depression Not adherent to prescription med, not actively  suicidal or homicidal, but there is obvious deterioration in mental health, more pressure of speech states she will resume fluoxetine as prescribed , she needs this   Bilateral leg edema counselled to reduce salt intake and elevate legs Start daily lasix and potassium, then use as needed No current symptoms or signs of CHF   Vitamin D deficiency Need to take weekly supplement is stressed , she is to fill script   Urinary incontinence Symptomatic, but non compliant with medication , this is re sent. Encourage Kegel exercises also   Intrinsic asthma No recent flares, however pt reports having no inhaler currently, the need to always have a recue inhaler is explained, she should fill proventil prescribed   Morbid obesity with BMI of 40.0-44.9, adult Deteriorated. Patient re-educated about  the importance of commitment to a  minimum of 150 minutes of exercise per week.  The importance of healthy food choices with portion control discussed. Encouraged to start a food diary, count calories and to consider  joining a support group. Sample diet sheets offered. Goals set by the patient for the next several months.   Weight /BMI 08/19/2014 07/26/2014 04/01/2014  WEIGHT 264 lb 263 lb 0.6 oz 243 lb 1.3 oz  HEIGHT 5\' 5"  5\' 5"  5\' 5"   BMI 43.93 kg/m2 43.77 kg/m2 40.45 kg/m2    Current exercise per week 30 minutes.    Need for vaccination with 13-polyvalent pneumococcal conjugate vaccine After obtaining informed consent, the vaccine is  administered by LPN.

## 2014-08-20 NOTE — Assessment & Plan Note (Signed)
counselled to reduce salt intake and elevate legs Start daily lasix and potassium, then use as needed No current symptoms or signs of CHF

## 2014-08-20 NOTE — Assessment & Plan Note (Signed)
Need to take weekly supplement is stressed , she is to fill script

## 2014-08-20 NOTE — Assessment & Plan Note (Signed)
Deteriorated. Patient re-educated about  the importance of commitment to a  minimum of 150 minutes of exercise per week.  The importance of healthy food choices with portion control discussed. Encouraged to start a food diary, count calories and to consider  joining a support group. Sample diet sheets offered. Goals set by the patient for the next several months.   Weight /BMI 08/19/2014 07/26/2014 04/01/2014  WEIGHT 264 lb 263 lb 0.6 oz 243 lb 1.3 oz  HEIGHT 5\' 5"  5\' 5"  5\' 5"   BMI 43.93 kg/m2 43.77 kg/m2 40.45 kg/m2    Current exercise per week 30 minutes.

## 2014-08-20 NOTE — Assessment & Plan Note (Signed)
Not adherent to prescription med, not actively suicidal or homicidal, but there is obvious deterioration in mental health, more pressure of speech states she will resume fluoxetine as prescribed , she needs this

## 2014-08-20 NOTE — Assessment & Plan Note (Signed)
Symptomatic, but non compliant with medication , this is re sent. Encourage Kegel exercises also

## 2014-08-20 NOTE — Assessment & Plan Note (Signed)
No recent flares, however pt reports having no inhaler currently, the need to always have a recue inhaler is explained, she should fill proventil prescribed

## 2014-08-20 NOTE — Assessment & Plan Note (Signed)
Controlled on medications.

## 2014-08-20 NOTE — Assessment & Plan Note (Signed)
Nightly use of CPAP equipment is stressed

## 2014-08-20 NOTE — Progress Notes (Signed)
   Subjective:    Patient ID: Angela Chase, female    DOB: [birth year], 69 y.o.   MRN: [id number]  HPI The PT is here for follow up and re-evaluation of chronic medical conditions, medication management and review of any available recent lab and radiology data.  Preventive health is updated, specifically  Cancer screening and Immunization.   Hs now decided that she needs to see ortho as her knee pain and instability have significantly worsened The PT denies any adverse reactions to current medications since the last visit.        Review of Systems See HPI Denies recent fever or chills. Denies sinus pressure, nasal congestion, ear pain or sore throat. Denies chest congestion, productive cough or wheezing. Denies chest pains, palpitations and leg swelling Denies abdominal pain, nausea, vomiting,diarrhea or constipation.   Denies dysuria, frequency, hesitancy or incontinence. C/o increased  joint pain, swelling and limitation in mobility. Denies headaches, seizures, numbness, or tingling. Denies uncontrolled  depression, anxiety or insomnia. Denies skin break down or rash.        Objective:   Physical Exam BP 134/80 mmHg  Pulse 92  Resp 16  Ht 5\' 5"  (1.651 m)  Wt 264 lb (119.75 kg)  BMI 43.93 kg/m2  SpO2 96%   Patient alert and oriented and in no cardiopulmonary distress.  HEENT: No facial asymmetry, EOMI,   oropharynx pink and moist.  Neck supple no JVD, no mass.  Chest: Clear to auscultation bilaterally.  CVS: S1, S2 no murmurs, no S3.Regular rate.  ABD: Soft non tender.   Ext: No edema  TD:HRCBULAGT  ROM spine, shoulders, hips and knees.  Skin: Intact, no ulcerations or rash noted.  Psych: Good eye contact, normal affect. Memory intact not anxious or depressed appearing.  CNS: CN 2-12 intact, power,  normal throughout.no focal deficits noted.        Assessment & Plan:  Osteoarthritis of both knees Worsening pain and  instability. Uncontrolled.Toradol and depo medrol administered IM in the office , to be followed by a short course of oral prednisone and NSAIDS. Refer to ortho also   Need for vaccination with 13-polyvalent pneumococcal conjugate vaccine After obtaining informed consent, the vaccine is  administered by LPN.    Intrinsic asthma Controlled, no change in medication    Morbid obesity with BMI of 40.0-44.9, adult Deteriorated. Patient re-educated about  the importance of commitment to a  minimum of 150 minutes of exercise per week.  The importance of healthy food choices with portion control discussed. Encouraged to start a food diary, count calories and to consider  joining a support group. Sample diet sheets offered. Goals set by the patient for the next several months.   Weight /BMI 08/19/2014 07/26/2014 04/01/2014  WEIGHT 264 lb 263 lb 0.6 oz 243 lb 1.3 oz  HEIGHT 5\' 5"  5\' 5"  5\' 5"   BMI 43.93 kg/m2 43.77 kg/m2 40.45 kg/m2    Current exercise per week 30 minutes.    Depression Improved, now compliant with her medication , she is to continue same   Bilateral leg edema Controlled on medications   OSA (obstructive sleep apnea) Nightly use of CPAP equipment is stressed

## 2014-08-20 NOTE — Assessment & Plan Note (Signed)
After obtaining informed consent, the vaccine is  administered by LPN.  

## 2014-08-20 NOTE — Assessment & Plan Note (Signed)
Worsening pain and instability. Uncontrolled.Toradol and depo medrol administered IM in the office , to be followed by a short course of oral prednisone and NSAIDS. Refer to ortho also

## 2014-08-20 NOTE — Assessment & Plan Note (Signed)
Improved, now compliant with her medication , she is to continue same

## 2014-08-23 ENCOUNTER — Ambulatory Visit: Payer: BC Managed Care – PPO | Admitting: Pulmonary Disease

## 2014-08-24 ENCOUNTER — Encounter: Payer: Self-pay | Admitting: Orthopedic Surgery

## 2014-09-05 ENCOUNTER — Encounter: Payer: BC Managed Care – PPO | Admitting: Family Medicine

## 2014-09-07 ENCOUNTER — Encounter (HOSPITAL_COMMUNITY): Payer: BC Managed Care – PPO

## 2014-09-08 ENCOUNTER — Ambulatory Visit (HOSPITAL_COMMUNITY)
Admission: RE | Admit: 2014-09-08 | Discharge: 2014-09-08 | Disposition: A | Discharge status: Home / Self Care | Payer: BC Managed Care – PPO | Source: Ambulatory Visit | Attending: Family Medicine | Admitting: Family Medicine

## 2014-09-08 ENCOUNTER — Other Ambulatory Visit: Payer: Self-pay | Admitting: Orthopedic Surgery

## 2014-09-08 ENCOUNTER — Ambulatory Visit (HOSPITAL_COMMUNITY)
Admission: RE | Admit: 2014-09-08 | Discharge: 2014-09-08 | Disposition: A | Discharge status: Home / Self Care | Payer: BC Managed Care – PPO | Source: Ambulatory Visit | Attending: Orthopedic Surgery | Admitting: Orthopedic Surgery

## 2014-09-08 DIAGNOSIS — M545 Low back pain: Secondary | ICD-10-CM | POA: Diagnosis present

## 2014-09-08 DIAGNOSIS — M25562 Pain in left knee: Secondary | ICD-10-CM

## 2014-09-08 DIAGNOSIS — M47816 Spondylosis without myelopathy or radiculopathy, lumbar region: Secondary | ICD-10-CM | POA: Insufficient documentation

## 2014-09-08 DIAGNOSIS — Z1231 Encounter for screening mammogram for malignant neoplasm of breast: Secondary | ICD-10-CM

## 2014-09-08 DIAGNOSIS — M25561 Pain in right knee: Secondary | ICD-10-CM

## 2014-09-08 DIAGNOSIS — M170 Bilateral primary osteoarthritis of knee: Secondary | ICD-10-CM | POA: Diagnosis not present

## 2014-09-08 DIAGNOSIS — M549 Dorsalgia, unspecified: Secondary | ICD-10-CM

## 2014-09-09 ENCOUNTER — Ambulatory Visit: Payer: PPO | Admitting: Pulmonary Disease

## 2014-09-12 ENCOUNTER — Ambulatory Visit (INDEPENDENT_AMBULATORY_CARE_PROVIDER_SITE_OTHER): Payer: PPO | Admitting: Orthopedic Surgery

## 2014-09-12 ENCOUNTER — Encounter: Payer: Self-pay | Admitting: Orthopedic Surgery

## 2014-09-12 VITALS — BP 131/71 | Ht 65.0 in | Wt 264.0 lb

## 2014-09-12 DIAGNOSIS — M170 Bilateral primary osteoarthritis of knee: Secondary | ICD-10-CM

## 2014-09-12 NOTE — Patient Instructions (Signed)
Joint Injection  Care After  Refer to this sheet in the next few days. These instructions provide you with information on caring for yourself after you have had a joint injection. Your caregiver also may give you more specific instructions. Your treatment has been planned according to current medical practices, but problems sometimes occur. Call your caregiver if you have any problems or questions after your procedure.  After any type of joint injection, it is not uncommon to experience:  · Soreness, swelling, or bruising around the injection site.  · Mild numbness, tingling, or weakness around the injection site caused by the numbing medicine used before or with the injection.  It also is possible to experience the following effects associated with the specific agent after injection:  · Iodine-based contrast agents:  ¨ Allergic reaction (itching, hives, widespread redness, and swelling beyond the injection site).  · Corticosteroids (These effects are rare.):  ¨ Allergic reaction.  ¨ Increased blood sugar levels (If you have diabetes and you notice that your blood sugar levels have increased, notify your caregiver).  ¨ Increased blood pressure levels.  ¨ Mood swings.  · Hyaluronic acid in the use of viscosupplementation.  ¨ Temporary heat or redness.  ¨ Temporary rash and itching.  ¨ Increased fluid accumulation in the injected joint.  These effects all should resolve within a day after your procedure.   HOME CARE INSTRUCTIONS  · Limit yourself to light activity the day of your procedure. Avoid lifting heavy objects, bending, stooping, or twisting.  · Take prescription or over-the-counter pain medication as directed by your caregiver.  · You may apply ice to your injection site to reduce pain and swelling the day of your procedure. Ice may be applied 03-04 times:  ¨ Put ice in a plastic bag.  ¨ Place a towel between your skin and the bag.  ¨ Leave the ice on for no longer than 15-20 minutes each time.  SEEK  IMMEDIATE MEDICAL CARE IF:   · Pain and swelling get worse rather than better or extend beyond the injection site.  · Numbness does not go away.  · Blood or fluid continues to leak from the injection site.  · You have chest pain.  · You have swelling of your face or tongue.  · You have trouble breathing or you become dizzy.  · You develop a fever, chills, or severe tenderness at the injection site that last longer than 1 day.  MAKE SURE YOU:  · Understand these instructions.  · Watch your condition.  · Get help right away if you are not doing well or if you get worse.  Document Released: 12/27/2010 Document Revised: 07/08/2011 Document Reviewed: 12/27/2010  Exit[employer]® Patient Information ©2015 [employer]. This information is not intended to replace advice given to you by your health care provider. Make sure you discuss any questions you have with your health care provider.

## 2014-09-12 NOTE — Progress Notes (Signed)
Patient ID: Angela Chase, female   DOB: 1945/12/27, 69 y.o.   MRN: [id number] Patient ID: Angela Chase, female   DOB: Apr 22, 1946, 69 y.o.   MRN: [id number]  Chief Complaint  Patient presents with  . Knee Pain    Bilateral knee pain, no injury. Referred from Dr. Moshe Cipro. Xrays at [facility].    Angela Chase is a 69 y.o. female.   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 She recently retired. She had a medical illness in 2015 where she had an infection around her heart had been in the ICU that she has no real heart disease.  She presents for evaluation of bilateral knee pain which she's had for 30 years. She complains of severe constant sharp bilateral knee pain right greater than left associated with swelling, catching, stiffness, giving way, and she has not had any relief she did have some water therapy.  System review numbness and tingling related to chronic back pain which is weather related. Review of Systems Weight loss and fatigue noted recently, sinus problems sore throat. Vision problems chronic. Asthma related wheezing shortness of breath and cough area occasional nausea. Frequent urination excessive nighttime urination cold intolerance skin rashes at time seasonal allergies. Cramping of the legs and feet numbness and tingling which is related to her back. All other systems were reviewed and were normal  No allergies  Past Medical History  Diagnosis Date  . Mixed hyperlipidemia   . Osteoarthritis   . GERD (gastroesophageal reflux disease) 1995  . Asthma 2010  . Anxiety   . Depression   . Hoarseness   . Obstructive sleep apnea     using a cpap-severe  . Wears glasses     Past Surgical History  Procedure Laterality Date  . Rotator cuff repair Right 2005  . Cholecystectomy  1985    Family History  Problem Relation Age of Onset  . Cancer  Father   . Cancer Mother   . Heart attack Mother   . Cancer Brother     Social History History  Substance Use Topics  . Smoking status: Never Smoker   . Smokeless tobacco: Never Used  . Alcohol Use: No    Allergies  Allergen Reactions  . Cephalexin Hives  . Omeprazole Hives  . Rofecoxib Rash    Current Outpatient Prescriptions  Medication Sig Dispense Refill  . albuterol (PROVENTIL HFA;VENTOLIN HFA) 108 (90 BASE) MCG/ACT inhaler Inhale 2 puffs into the lungs every 6 (six) hours as needed for wheezing or shortness of breath. 18 g 0  . diclofenac sodium (VOLTAREN) 1 % GEL Apply twice daily to each knee 100 g 1  . ergocalciferol (VITAMIN D2) 50000 UNITS capsule Take 1 capsule (50,000 Units total) by mouth once a week. One capsule once weekly 4 capsule 5  . FLUoxetine (PROZAC) 20 MG capsule Take 1 capsule (20 mg total) by mouth daily. 30 capsule 3  . furosemide (LASIX) 40 MG tablet Take 1 tablet (40 mg total) by mouth daily. 30 tablet 1  . mometasone (NASONEX) 50 MCG/ACT nasal spray Place 2 sprays into the nose daily. 17 g 12  . montelukast (SINGULAIR) 10 MG tablet Take 1 tablet (10 mg total) by mouth at bedtime. 30 tablet 3  . potassium chloride SA (K-DUR,KLOR-CON) 20 MEQ tablet Take 1 tablet (20 mEq total) by mouth 2 (two) times daily. 60 tablet  1  . predniSONE (STERAPRED UNI-PAK 21 TAB) 5 MG (21) TBPK tablet Take 1 tablet (5 mg total) by mouth as directed. Use as directed 21 tablet 0  . solifenacin (VESICARE) 5 MG tablet Take 1 tablet (5 mg total) by mouth daily. 30 tablet 4  . Spacer/Aero-Holding Dorise Bullion Use with inhaler 1 each 1   No current facility-administered medications for this visit.    Physical Exam Blood pressure 131/71, height 5\' 5"  (1.651 m), weight 264 lb (119.75 kg). Physical Exam The patient is well developed well nourished and well groomed. Orientation to person place and time is normal  Mood is pleasant. Ambulatory status she is ambulatory but she has  bilateral pes planus which appears to be rigid and most likely secondary to posterior tibial tendon dysfunction.  Her upper extremities show no contracture subluxation atrophy tremor skin lesions or neurologic deficit. Vascular exam normal pulse and perfusion bilateral upper extremities  The right knee only flexes to 30 it does not come to full extension she has a 5 flexion contracture her knee is stable in the AP and coronal plane her motor function is normal skin is intact she has a normal distal pulses and normal we'll test for sensation  Left knee flexes 85 has a 10 flexion contracture stable motor exam normal skin intact we'll test normal pulses normal   Data Reviewed The imaging studies from the hospital include lumbar spine bilateral knees she has increased spondylosis in the lumbar spine I interpret her knees is end-stage osteoarthritis with varus deformity multiple osteophytes secondary bone changes consistent with severe arthritis both knees  Assessment Bilateral knee arthritis with the underlying or in the setting of obesity and a BMI greater than 40   Plan She has to get her weight down to a BMI less than 40 prior to knee surgery to decrease her complication rate have talked to her about this and I will send her primary care physician a letter indicating that she needs to do this before we can do the surgery so if medication is necessary then go ahead and proceed

## 2014-09-13 ENCOUNTER — Encounter: Payer: Self-pay | Admitting: Pulmonary Disease

## 2014-09-13 ENCOUNTER — Ambulatory Visit (INDEPENDENT_AMBULATORY_CARE_PROVIDER_SITE_OTHER): Payer: PPO | Admitting: Pulmonary Disease

## 2014-09-13 VITALS — BP 136/80 | HR 79 | Temp 97.7°F | Ht 65.5 in | Wt 261.2 lb

## 2014-09-13 DIAGNOSIS — J45909 Unspecified asthma, uncomplicated: Secondary | ICD-10-CM | POA: Diagnosis not present

## 2014-09-13 DIAGNOSIS — G4733 Obstructive sleep apnea (adult) (pediatric): Secondary | ICD-10-CM

## 2014-09-13 NOTE — Assessment & Plan Note (Signed)
The patient has a questionable history of asthma, but we have never documented airflow obstruction. Comes in today where she has been off inhaled corticosteroids for at least a few months, and is having ongoing issues with cough. Her spirometry today is totally normal, therefore I think it is very unlikely she has asthma. She does have a lot of issues with allergic rhinitis postnasal drip, and I have asked her to stay on Singulair and also to try Zyrtec daily for a few weeks. If she continues to have her upper airway cough, I would consider aggressive treatment of possible laryngopharyngeal reflux. I have asked her to stay off her Symbicort, and not to use albuterol unless she sits down and gives herself a few minutes to resolve her dyspnea.

## 2014-09-13 NOTE — Patient Instructions (Addendum)
We'll send an order to your home care company to try and fix your C Pap device Keep working on weight reduction I want you to take zyrtec 10mg  OTC everynight at bedtime for next 2-3 weeks to see if this helps your allergy symptoms and postnasal drip.  Stay off symbicort, and try not to use albuterol until you sit down and give yourself 57min to settle down. followup with Dr. Halford Chessman in 59mos

## 2014-09-13 NOTE — Assessment & Plan Note (Signed)
The patient has severe obstructive sleep apnea, and tells me that she is wearing her device compliantly and successfully. However, her download is not picking up any data, and I suspect there is an ongoing issue with her device. Will get her back to her home care company to try and get this resolved. I've also encouraged her to work aggressively on weight loss.

## 2014-09-13 NOTE — Progress Notes (Signed)
   Subjective:    Patient ID: Angela Chase, female    DOB: 1945/09/19, 69 y.o.   MRN: [id number]  HPI The patient comes in today for follow-up of her known obstructive sleep apnea, as well as her questionable history of asthma. She tells me that she is wearing C Pap compliantly, but her download is not recording her usage. We tried to get this fixed last October, and still has not been addressed by her home care company. She feels that she is sleeping well with the device, and is having no issues with mask fit. In terms of her breathing, she is having great difficulties with allergies this year. Continuing on Singulair, but is not taking an antihistamine. She ran out of Symbicort, and has not filled. It has remained unclear all along whether she really has asthma or not. She has been using her rescue inhaler 1-2 times a day for shortness of breath, but has not sat down and rested before reaching for her rescue inhaler. She is having a lot of postnasal drip and tickle in her throat.   Review of Systems  Constitutional: Negative for fever and unexpected weight change.  HENT: Positive for congestion and postnasal drip. Negative for dental problem, ear pain, nosebleeds, rhinorrhea, sinus pressure, sneezing, sore throat and trouble swallowing.   Eyes: Negative for redness and itching.  Respiratory: Positive for cough, shortness of breath and wheezing. Negative for chest tightness.   Cardiovascular: Negative for palpitations and leg swelling.  Gastrointestinal: Negative for nausea and vomiting.  Genitourinary: Negative for dysuria.  Musculoskeletal: Negative for joint swelling.  Skin: Negative for rash.  Neurological: Negative for headaches.  Hematological: Does not bruise/bleed easily.  Psychiatric/Behavioral: Negative for dysphoric mood. The patient is not nervous/anxious.        Objective:   Physical Exam Obese female in no acute distress Nose without purulence or discharge noted No  skin breakdown or pressure necrosis from the C Pap mask Neck without lymphadenopathy or thyromegaly Chest with totally clear breath sounds, no wheezing Cardiac exam with regular rate and rhythm Lower extremities with mild edema, no cyanosis Alert and oriented, moves all 4 extremities.       Assessment & Plan:

## 2014-09-15 ENCOUNTER — Ambulatory Visit (INDEPENDENT_AMBULATORY_CARE_PROVIDER_SITE_OTHER): Payer: PPO | Admitting: Family Medicine

## 2014-09-15 ENCOUNTER — Encounter: Payer: Self-pay | Admitting: Family Medicine

## 2014-09-15 VITALS — BP 130/78 | HR 100 | Resp 18 | Ht 65.5 in | Wt 261.0 lb

## 2014-09-15 DIAGNOSIS — Z6841 Body Mass Index (BMI) 40.0 and over, adult: Secondary | ICD-10-CM

## 2014-09-15 DIAGNOSIS — W57XXXA Bitten or stung by nonvenomous insect and other nonvenomous arthropods, initial encounter: Secondary | ICD-10-CM

## 2014-09-15 DIAGNOSIS — T148 Other injury of unspecified body region: Secondary | ICD-10-CM | POA: Diagnosis not present

## 2014-09-15 DIAGNOSIS — L309 Dermatitis, unspecified: Secondary | ICD-10-CM | POA: Diagnosis not present

## 2014-09-15 DIAGNOSIS — Z23 Encounter for immunization: Secondary | ICD-10-CM

## 2014-09-15 MED ORDER — HYDROXYZINE HCL 25 MG PO TABS: 25.0000 mg | ORAL_TABLET | Freq: Three times a day (TID) | ORAL | Status: DC | PRN

## 2014-09-15 MED ORDER — METHYLPREDNISOLONE ACETATE 80 MG/ML IJ SUSP
80.0000 mg | Freq: Once | INTRAMUSCULAR | Status: AC
  Administered 2014-09-15: 80 mg via INTRAMUSCULAR

## 2014-09-15 MED ORDER — SULFAMETHOXAZOLE-TRIMETHOPRIM 800-160 MG PO TABS: 1.0000 | ORAL_TABLET | Freq: Two times a day (BID) | ORAL | Status: DC

## 2014-09-15 MED ORDER — PREDNISONE 5 MG PO TABS: 5.0000 mg | ORAL_TABLET | Freq: Two times a day (BID) | ORAL | Status: AC

## 2014-09-15 NOTE — Patient Instructions (Signed)
Annual physical exam in 2 month, call if you need me before  TdAP today  Medications sent for insect bites with itching  Depo medrol 80 mg Im in office today and TdAP today

## 2014-09-15 NOTE — Progress Notes (Signed)
   Subjective:    Patient ID: Angela Chase, female    DOB: Sep 25, 1945, 69 y.o.   MRN: [id number]  HPI 2 week h/o generalized rash on arms and legs, bruised area on left forearm this morning with swelling Denies wheeze, cough, or difficulty breathing or swallowing Multiple punctured areas like bites   Review of Systems See HPI Denies recent fever or chills. Denies sinus pressure, nasal congestion, ear pain or sore throat. Denies chest congestion, productive cough or wheezing. Chronic  joint pain, swelling and limitation in mobility.recently advised of need for knee replacement after some weight loss Denies uncontrolled depression        Objective:   Physical Exam BP 130/78 mmHg  Pulse 100  Resp 18  Ht 5' 5.5" (1.664 m)  Wt 261 lb (118.389 kg)  BMI 42.76 kg/m2  SpO2 97% Patient alert and oriented and in no cardiopulmonary distress.  HEENT: No facial asymmetry, EOMI,   oropharynx pink and moist.  Neck supple no JVD, no mass.  Chest: Clear to auscultation bilaterally.  CVS: S1, S2 no murmurs, no S3.Regular rate.  ABD: Soft non tender.   Ext: No edema  MS: decreased ROM spine, shoulders,multiple open insect bites on upper and lower extremities, no purulent drainage seen. Swelling and erythema of left forearm  Psych: Good eye contact, normal affect. Memory intact not anxious or depressed appearing.  CNS: CN 2-12 intact, power,  normal throughout.no focal deficits noted.        Assessment & Plan:  Multiple insect bites Multiple bites on extremitieS with redness and swelling of left forearm  Antibiotic and TdAP today    Dermatitis Swelling and redness of left forearm, depo medrol 80mg  IM in office   Morbid obesity with BMI of 40.0-44.9, adult unchnaged. Patient re-educated about  the importance of commitment to a  minimum of 150 minutes of exercise per week.  The importance of healthy food choices with portion control discussed. Encouraged to start  a food diary, count calories and to consider  joining a support group. Sample diet sheets offered. Goals set by the patient for the next several months.   Weight /BMI 09/15/2014 09/13/2014 09/12/2014  WEIGHT 261 lb 261 lb 3.2 oz 264 lb  HEIGHT 5' 5.5" 5' 5.5" 5\' 5"   BMI 42.76 kg/m2 42.79 kg/m2 43.93 kg/m2    Current exercise per week 30 minutes.

## 2014-09-19 DIAGNOSIS — L309 Dermatitis, unspecified: Secondary | ICD-10-CM | POA: Insufficient documentation

## 2014-09-19 DIAGNOSIS — Z23 Encounter for immunization: Secondary | ICD-10-CM | POA: Insufficient documentation

## 2014-09-19 NOTE — Assessment & Plan Note (Signed)
unchnaged. Patient re-educated about  the importance of commitment to a  minimum of 150 minutes of exercise per week.  The importance of healthy food choices with portion control discussed. Encouraged to start a food diary, count calories and to consider  joining a support group. Sample diet sheets offered. Goals set by the patient for the next several months.   Weight /BMI 09/15/2014 09/13/2014 09/12/2014  WEIGHT 261 lb 261 lb 3.2 oz 264 lb  HEIGHT 5' 5.5" 5' 5.5" 5\' 5"   BMI 42.76 kg/m2 42.79 kg/m2 43.93 kg/m2    Current exercise per week 30 minutes.

## 2014-09-19 NOTE — Assessment & Plan Note (Signed)
After obtaining informed consent, the vaccine is  administered by LPN.  

## 2014-09-19 NOTE — Assessment & Plan Note (Signed)
Multiple bites on extremitieS with redness and swelling of left forearm  Antibiotic and TdAP today

## 2014-09-19 NOTE — Assessment & Plan Note (Signed)
Swelling and redness of left forearm, depo medrol 80mg  IM in office

## 2014-10-13 ENCOUNTER — Ambulatory Visit (INDEPENDENT_AMBULATORY_CARE_PROVIDER_SITE_OTHER): Payer: PPO | Admitting: Orthopedic Surgery

## 2014-10-13 ENCOUNTER — Encounter: Payer: Self-pay | Admitting: Orthopedic Surgery

## 2014-10-13 VITALS — BP 125/68 | Ht 65.5 in | Wt 260.0 lb

## 2014-10-13 DIAGNOSIS — M170 Bilateral primary osteoarthritis of knee: Secondary | ICD-10-CM

## 2014-10-13 NOTE — Progress Notes (Signed)
Chief Complaint  Patient presents with  . Follow-up    1 month recheck on bilateral knees and weight check.    BP 125/68 mmHg  Ht 5' 5.5" (1.664 m)  Wt 260 lb (117.935 kg)  BMI 42.59 kg/m2  The patient has osteoarthritis of both knees. Her BMI is over 40 minutes currently 42.9 she lost 1 pound over the month. We encouraged her to continue with exercising walking and/or water aerobics and come back in 2 months with a goal weight of 254 pounds

## 2014-10-13 NOTE — Progress Notes (Signed)
BP 125/68 mmHg  Ht 5' 5.5" (1.664 m)  Wt 260 lb (117.935 kg)  BMI 42.59 kg/m2

## 2014-10-16 ENCOUNTER — Encounter (HOSPITAL_COMMUNITY): Payer: Self-pay | Admitting: Emergency Medicine

## 2014-10-16 ENCOUNTER — Emergency Department (HOSPITAL_COMMUNITY)
Admission: EM | Admit: 2014-10-16 | Discharge: 2014-10-16 | Disposition: A | Discharge status: Home / Self Care | Payer: PPO | Attending: Emergency Medicine | Admitting: Emergency Medicine

## 2014-10-16 DIAGNOSIS — Y998 Other external cause status: Secondary | ICD-10-CM | POA: Diagnosis not present

## 2014-10-16 DIAGNOSIS — Y9389 Activity, other specified: Secondary | ICD-10-CM | POA: Diagnosis not present

## 2014-10-16 DIAGNOSIS — G4733 Obstructive sleep apnea (adult) (pediatric): Secondary | ICD-10-CM | POA: Insufficient documentation

## 2014-10-16 DIAGNOSIS — Z7951 Long term (current) use of inhaled steroids: Secondary | ICD-10-CM | POA: Diagnosis not present

## 2014-10-16 DIAGNOSIS — Z79899 Other long term (current) drug therapy: Secondary | ICD-10-CM | POA: Insufficient documentation

## 2014-10-16 DIAGNOSIS — F419 Anxiety disorder, unspecified: Secondary | ICD-10-CM | POA: Insufficient documentation

## 2014-10-16 DIAGNOSIS — F329 Major depressive disorder, single episode, unspecified: Secondary | ICD-10-CM | POA: Diagnosis not present

## 2014-10-16 DIAGNOSIS — Y9289 Other specified places as the place of occurrence of the external cause: Secondary | ICD-10-CM | POA: Insufficient documentation

## 2014-10-16 DIAGNOSIS — Z8719 Personal history of other diseases of the digestive system: Secondary | ICD-10-CM | POA: Insufficient documentation

## 2014-10-16 DIAGNOSIS — M1990 Unspecified osteoarthritis, unspecified site: Secondary | ICD-10-CM | POA: Insufficient documentation

## 2014-10-16 DIAGNOSIS — Z7982 Long term (current) use of aspirin: Secondary | ICD-10-CM | POA: Diagnosis not present

## 2014-10-16 DIAGNOSIS — Z8639 Personal history of other endocrine, nutritional and metabolic disease: Secondary | ICD-10-CM | POA: Diagnosis not present

## 2014-10-16 DIAGNOSIS — X58XXXA Exposure to other specified factors, initial encounter: Secondary | ICD-10-CM | POA: Diagnosis not present

## 2014-10-16 DIAGNOSIS — G8929 Other chronic pain: Secondary | ICD-10-CM

## 2014-10-16 DIAGNOSIS — T161XXA Foreign body in right ear, initial encounter: Secondary | ICD-10-CM | POA: Insufficient documentation

## 2014-10-16 DIAGNOSIS — M25562 Pain in left knee: Secondary | ICD-10-CM | POA: Diagnosis not present

## 2014-10-16 DIAGNOSIS — J45909 Unspecified asthma, uncomplicated: Secondary | ICD-10-CM | POA: Insufficient documentation

## 2014-10-16 MED ORDER — NEOMYCIN-POLYMYXIN-HC 3.5-10000-1 OT SUSP: 4.0000 [drp] | Freq: Three times a day (TID) | OTIC | Status: DC

## 2014-10-16 MED ORDER — HYDROCODONE-ACETAMINOPHEN 5-325 MG PO TABS: 2.0000 | ORAL_TABLET | ORAL | Status: DC | PRN

## 2014-10-16 NOTE — ED Notes (Signed)
Pt reports that she was visiting some relatives yesterday and fell asleep on the cough. Suddenly she felt and heard something in her right ear. Pt tried peroxide and baby oil but has decreased hearing in right ear. Pt also reports pain to left knee ongoing for weeks.

## 2014-10-16 NOTE — ED Provider Notes (Signed)
CSN: [id number]     Arrival date & time 10/16/14  1126 History  This chart was scribed for Caryl Ada, PA-C, working with No att. providers found by [employer], ED Scribe. The patient was seen in room TR06C/TR06C at 12:36 PM.    Chief Complaint  Patient presents with  . Hearing Problem  . Knee Pain  . Foreign Body in [place]      The history is provided by the patient. No language interpreter was used.    Angela Chase is a 69 y.o. female who presents to the Emergency Department complaining of FB in right ear onset yesterday. She reports that she fell asleep on the cough and something crawled into her ear. She notes that she can feel something moving and there is a loud noise from it. She notes that she has tried baby oil and peroxide with no relief of her symptoms. She denies  and any other symptoms. She denies medical hx of DM.  She also notes that she is having issues with her left knee that she is being seen for by Dr. Aline Brochure is her orthopedist and she was informed that she needs to have a knee replacement, that she has been receiving shots for. She reports that walking worsens the pain. She took tylenol and instaflex with mild relief of her symptoms. She states that she is allergic to keflex.   Past Medical History  Diagnosis Date  . Mixed hyperlipidemia   . Osteoarthritis   . GERD (gastroesophageal reflux disease) 1995  . Asthma 2010  . Anxiety   . Depression   . Hoarseness   . Obstructive sleep apnea     using a cpap-severe  . Wears glasses    Past Surgical History  Procedure Laterality Date  . Rotator cuff repair Right 2005  . Cholecystectomy  1985   Family History  Problem Relation Age of Onset  . Cancer Father   . Cancer Mother   . Heart attack Mother   . Cancer Brother    History  Substance Use Topics  . Smoking status: Never Smoker   . Smokeless tobacco: Never Used  . Alcohol Use: No   OB History    No data available     Review of Systems   HENT:       FB in right ear  Musculoskeletal: Positive for arthralgias. Negative for joint swelling.  Skin: Negative for color change.      Allergies  Cephalexin; Omeprazole; and Rofecoxib  Home Medications   Prior to Admission medications   Medication Sig Start Date End Date Taking? Authorizing Provider  acetaminophen (TYLENOL) 500 MG tablet Take 500 mg by mouth every 6 (six) hours as needed.    Historical Provider, MD  albuterol (PROVENTIL HFA;VENTOLIN HFA) 108 (90 BASE) MCG/ACT inhaler Inhale 2 puffs into the lungs every 6 (six) hours as needed for wheezing or shortness of breath. 07/27/14   Fayrene Helper, MD  aspirin 81 MG tablet Take 81 mg by mouth daily.    Historical Provider, MD  diclofenac sodium (VOLTAREN) 1 % GEL Apply twice daily to each knee 08/19/14   Fayrene Helper, MD  ergocalciferol (VITAMIN D2) 50000 UNITS capsule Take 1 capsule (50,000 Units total) by mouth once a week. One capsule once weekly 04/01/14   Fayrene Helper, MD  FLUoxetine (PROZAC) 20 MG capsule Take 1 capsule (20 mg total) by mouth daily. 07/27/14   Fayrene Helper, MD  furosemide (LASIX) 40 MG  tablet Take 1 tablet (40 mg total) by mouth daily. 07/26/14   Fayrene Helper, MD  hydrOXYzine (ATARAX/VISTARIL) 25 MG tablet Take 1 tablet (25 mg total) by mouth 3 (three) times daily as needed. 09/15/14   Fayrene Helper, MD  mometasone (NASONEX) 50 MCG/ACT nasal spray Place 2 sprays into the nose daily. Patient taking differently: Place 2 sprays into the nose as needed.  08/19/14   Fayrene Helper, MD  montelukast (SINGULAIR) 10 MG tablet Take 1 tablet (10 mg total) by mouth at bedtime. 08/19/14   Fayrene Helper, MD  potassium chloride SA (K-DUR,KLOR-CON) 20 MEQ tablet Take 1 tablet (20 mEq total) by mouth 2 (two) times daily. 07/26/14   Fayrene Helper, MD  solifenacin (VESICARE) 5 MG tablet Take 1 tablet (5 mg total) by mouth daily. 07/27/14   Fayrene Helper, MD  Spacer/Aero-Holding  Josiah Lobo DEVI Use with inhaler 02/26/14   Waldemar Dickens, MD  sulfamethoxazole-trimethoprim (BACTRIM DS,SEPTRA DS) 800-160 MG per tablet Take 1 tablet by mouth 2 (two) times daily. 09/15/14   Fayrene Helper, MD   BP 118/60 mmHg  Pulse 75  Temp(Src) 97.7 F (36.5 C) (Oral)  Resp 18  Ht 5' 5.5" (1.664 m)  Wt 260 lb (117.935 kg)  BMI 42.59 kg/m2  SpO2 95% Physical Exam  Constitutional: She is oriented to person, place, and time. She appears well-developed and well-nourished. No distress.  HENT:  Head: Normocephalic and atraumatic.  Right Ear: A foreign body is present.  Bug thorax visualized in right ear.   Eyes: EOM are normal.  Neck: Neck supple. No tracheal deviation present.  Cardiovascular: Normal rate.   Pulmonary/Chest: Effort normal. No respiratory distress.  Musculoskeletal: Normal range of motion.  Bilaterally knees appear arthritic. Left knee is diffusely tender.   Neurological: She is alert and oriented to person, place, and time.  Skin: Skin is warm and dry.  Psychiatric: She has a normal mood and affect. Her behavior is normal.  Nursing note and vitals reviewed.   ED Course  Procedures (including critical care time) DIAGNOSTIC STUDIES: Oxygen Saturation is 95% on RA, adequate by my interpretation.    COORDINATION OF CARE: 12:43 PM-Discussed treatment plan which includes removal of FB with pt at bedside and pt agreed to plan.   Labs Review Labs Reviewed - No data to display  Imaging Review No results found.   EKG Interpretation None      MDM  Insect removed with alligator forceps.  Pt given rx for cortisporin.  Pt has severe knee pain.  She is suppose to have a knee replacement.  Pt reports pain has become intense. No relief with otc medications.  She plans to call Orthopaedist   Final diagnoses:  Acute foreign body of right ear canal, initial encounter  Knee pain, chronic, left   Hydrocodone Cortisporin   I personally performed the services in  this documentation, which was scribed in my presence.  The recorded information has been reviewed and considered.   Ronnald Collum.   Hollace Kinnier Alta, PA-C 10/16/14 [place], MD 10/21/14 [phone]

## 2014-10-16 NOTE — Discharge Instructions (Signed)
Arthritis, Nonspecific °Arthritis is inflammation of a joint. This usually means pain, redness, warmth or swelling are present. One or more joints may be involved. There are a number of types of arthritis. Your caregiver may not be able to tell what type of arthritis you have right away. °CAUSES  °The most common cause of arthritis is the wear and tear on the joint (osteoarthritis). This causes damage to the cartilage, which can break down over time. The knees, hips, back and neck are most often affected by this type of arthritis. °Other types of arthritis and common causes of joint pain include: °· Sprains and other injuries near the joint. Sometimes minor sprains and injuries cause pain and swelling that develop hours later. °· Rheumatoid arthritis. This affects hands, feet and knees. It usually affects both sides of your body at the same time. It is often associated with chronic ailments, fever, weight loss and general weakness. °· Crystal arthritis. Gout and pseudo gout can cause occasional acute severe pain, redness and swelling in the foot, ankle, or knee. °· Infectious arthritis. Bacteria can get into a joint through a break in overlying skin. This can cause infection of the joint. Bacteria and viruses can also spread through the blood and affect your joints. °· Drug, infectious and allergy reactions. Sometimes joints can become mildly painful and slightly swollen with these types of illnesses. °SYMPTOMS  °· Pain is the main symptom. °· Your joint or joints can also be red, swollen and warm or hot to the touch. °· You may have a fever with certain types of arthritis, or even feel overall ill. °· The joint with arthritis will hurt with movement. Stiffness is present with some types of arthritis. °DIAGNOSIS  °Your caregiver will suspect arthritis based on your description of your symptoms and on your exam. Testing may be needed to find the type of arthritis: °· Blood and sometimes urine tests. °· X-ray tests  and sometimes CT or MRI scans. °· Removal of fluid from the joint (arthrocentesis) is done to check for bacteria, crystals or other causes. Your caregiver (or a specialist) will numb the area over the joint with a local anesthetic, and use a needle to remove joint fluid for examination. This procedure is only minimally uncomfortable. °· Even with these tests, your caregiver may not be able to tell what kind of arthritis you have. Consultation with a specialist (rheumatologist) may be helpful. °TREATMENT  °Your caregiver will discuss with you treatment specific to your type of arthritis. If the specific type cannot be determined, then the following general recommendations may apply. °Treatment of severe joint pain includes: °· Rest. °· Elevation. °· Anti-inflammatory medication (for example, ibuprofen) may be prescribed. Avoiding activities that cause increased pain. °· Only take over-the-counter or prescription medicines for pain and discomfort as recommended by your caregiver. °· Cold packs over an inflamed joint may be used for 10 to 15 minutes every hour. Hot packs sometimes feel better, but do not use overnight. Do not use hot packs if you are diabetic without your caregiver's permission. °· A cortisone shot into arthritic joints may help reduce pain and swelling. °· Any acute arthritis that gets worse over the next 1 to 2 days needs to be looked at to be sure there is no joint infection. °Long-term arthritis treatment involves modifying activities and lifestyle to reduce joint stress jarring. This can include weight loss. Also, exercise is needed to nourish the joint cartilage and remove waste. This helps keep the muscles   around the joint strong. HOME CARE INSTRUCTIONS   Do not take aspirin to relieve pain if gout is suspected. This elevates uric acid levels.  Only take over-the-counter or prescription medicines for pain, discomfort or fever as directed by your caregiver.  Rest the joint as much as  possible.  If your joint is swollen, keep it elevated.  Use crutches if the painful joint is in your leg.  Drinking plenty of fluids may help for certain types of arthritis.  Follow your caregiver's dietary instructions.  Try low-impact exercise such as:  Swimming.  Water aerobics.  Biking.  Walking.  Morning stiffness is often relieved by a warm shower.  Put your joints through regular range-of-motion. SEEK MEDICAL CARE IF:   You do not feel better in 24 hours or are getting worse.  You have side effects to medications, or are not getting better with treatment. SEEK IMMEDIATE MEDICAL CARE IF:   You have a fever.  You develop severe joint pain, swelling or redness.  Many joints are involved and become painful and swollen.  There is severe back pain and/or leg weakness.  You have loss of bowel or bladder control. Document Released: 05/23/2004 Document Revised: 07/08/2011 Document Reviewed: 06/08/2008 [facility] Patient Information 2015 [place], [place]. This information is not intended to replace advice given to you by your health care provider. Make sure you discuss any questions you have with your health care provider. Ear Foreign Body An ear foreign body is an object that is stuck in the ear. It is common for young children to put objects into the ear canal. These may include pebbles, beads, beans, and any other small objects which will fit. In adults, objects such as cotton swabs may become lodged in the ear canal. In all ages, the most common foreign bodies are insects that enter the ear canal.  SYMPTOMS  Foreign bodies may cause pain, buzzing or roaring sounds, hearing loss, and ear drainage.  HOME CARE INSTRUCTIONS   Keep all follow-up appointments with your caregiver as told.  Keep small objects out of reach of young children. Tell them not to put anything in their ears. SEEK IMMEDIATE MEDICAL CARE IF:   You have bleeding from the ear.  You have increased  pain or swelling of the ear.  You have reduced hearing.  You have discharge coming from the ear.  You have a fever.  You have a headache. MAKE SURE YOU:   Understand these instructions.  Will watch your condition.  Will get help right away if you are not doing well or get worse. Document Released: 04/12/2000 Document Revised: 07/08/2011 Document Reviewed: 12/02/2007 [facility] Patient Information 2015 [place], [place]. This information is not intended to replace advice given to you by your health care provider. Make sure you discuss any questions you have with your health care provider.

## 2014-11-02 ENCOUNTER — Other Ambulatory Visit: Payer: Self-pay

## 2014-11-02 MED ORDER — FUROSEMIDE 40 MG PO TABS: 40.0000 mg | ORAL_TABLET | Freq: Every day | ORAL | Status: DC

## 2014-11-18 ENCOUNTER — Other Ambulatory Visit: Payer: Self-pay

## 2014-11-18 DIAGNOSIS — W57XXXA Bitten or stung by nonvenomous insect and other nonvenomous arthropods, initial encounter: Secondary | ICD-10-CM

## 2014-11-18 MED ORDER — HYDROXYZINE HCL 25 MG PO TABS: 25.0000 mg | ORAL_TABLET | Freq: Three times a day (TID) | ORAL | Status: DC | PRN

## 2014-11-22 ENCOUNTER — Other Ambulatory Visit: Payer: Self-pay

## 2014-11-22 MED ORDER — POTASSIUM CHLORIDE CRYS ER 20 MEQ PO TBCR: 20.0000 meq | EXTENDED_RELEASE_TABLET | Freq: Two times a day (BID) | ORAL | Status: DC

## 2014-12-13 ENCOUNTER — Encounter: Payer: Self-pay | Admitting: Orthopedic Surgery

## 2014-12-13 ENCOUNTER — Ambulatory Visit (INDEPENDENT_AMBULATORY_CARE_PROVIDER_SITE_OTHER): Payer: PPO | Admitting: Orthopedic Surgery

## 2014-12-13 VITALS — BP 126/66 | Ht 65.0 in | Wt 269.0 lb

## 2014-12-13 DIAGNOSIS — E669 Obesity, unspecified: Secondary | ICD-10-CM

## 2014-12-13 DIAGNOSIS — M170 Bilateral primary osteoarthritis of knee: Secondary | ICD-10-CM | POA: Diagnosis not present

## 2014-12-13 MED ORDER — HYDROCODONE-ACETAMINOPHEN 5-325 MG PO TABS: 1.0000 | ORAL_TABLET | Freq: Three times a day (TID) | ORAL | Status: DC | PRN

## 2014-12-13 NOTE — Patient Instructions (Signed)
Joint Injection  Care After  Refer to this sheet in the next few days. These instructions provide you with information on caring for yourself after you have had a joint injection. Your caregiver also may give you more specific instructions. Your treatment has been planned according to current medical practices, but problems sometimes occur. Call your caregiver if you have any problems or questions after your procedure.  After any type of joint injection, it is not uncommon to experience:  · Soreness, swelling, or bruising around the injection site.  · Mild numbness, tingling, or weakness around the injection site caused by the numbing medicine used before or with the injection.  It also is possible to experience the following effects associated with the specific agent after injection:  · Iodine-based contrast agents:  ¨ Allergic reaction (itching, hives, widespread redness, and swelling beyond the injection site).  · Corticosteroids (These effects are rare.):  ¨ Allergic reaction.  ¨ Increased blood sugar levels (If you have diabetes and you notice that your blood sugar levels have increased, notify your caregiver).  ¨ Increased blood pressure levels.  ¨ Mood swings.  · Hyaluronic acid in the use of viscosupplementation.  ¨ Temporary heat or redness.  ¨ Temporary rash and itching.  ¨ Increased fluid accumulation in the injected joint.  These effects all should resolve within a day after your procedure.   HOME CARE INSTRUCTIONS  · Limit yourself to light activity the day of your procedure. Avoid lifting heavy objects, bending, stooping, or twisting.  · Take prescription or over-the-counter pain medication as directed by your caregiver.  · You may apply ice to your injection site to reduce pain and swelling the day of your procedure. Ice may be applied 03-04 times:  ¨ Put ice in a plastic bag.  ¨ Place a towel between your skin and the bag.  ¨ Leave the ice on for no longer than 15-20 minutes each time.  SEEK  IMMEDIATE MEDICAL CARE IF:   · Pain and swelling get worse rather than better or extend beyond the injection site.  · Numbness does not go away.  · Blood or fluid continues to leak from the injection site.  · You have chest pain.  · You have swelling of your face or tongue.  · You have trouble breathing or you become dizzy.  · You develop a fever, chills, or severe tenderness at the injection site that last longer than 1 day.  MAKE SURE YOU:  · Understand these instructions.  · Watch your condition.  · Get help right away if you are not doing well or if you get worse.  Document Released: 12/27/2010 Document Revised: 07/08/2011 Document Reviewed: 12/27/2010  Exit[employer]® Patient Information ©2015 [employer]. This information is not intended to replace advice given to you by your health care provider. Make sure you discuss any questions you have with your health care provider.

## 2014-12-13 NOTE — Progress Notes (Signed)
Patient ID: Angela Chase, female   DOB: 03-23-1946, 69 y.o.   MRN: [id number]  Follow up visit  Chief Complaint  Patient presents with  . Follow-up    2 month recheck on bilateral knees and weight.    BP 126/66 mmHg  Ht 5\' 5"  (1.651 m)  Wt 269 lb (122.018 kg)  BMI 44.76 kg/m2  Encounter Diagnoses  Name Primary?  . Primary osteoarthritis of both knees Yes  . Obesity     Two-month follow-up the patient is now on Tylenol for pain she has a supplement which I told her it is probably not worth 50 box she still having bilateral knee pain cannot exercise her weight actually went up 9 pounds  Bilateral knee pain bilateral osteoarthritis and obesity with her BMI 44 ready for knee replacement.  System review she is dealing with some breathing issues and see Pap from sleep apnea she is on albuterol.  Exam shows bilateral knee flexion limited by the size of the leg and by her disease with an arc of 100 on each side and tenderness in the medial and lateral joint lines of each knee and around the peripatellar region indicating synovitis though not significant warmth noted. Extension power is normal bilaterally. She has a lot of bug bites from a visit she had somewhere and got into some insects. Those are healing well without erythema especially noted on the left side  We injected both knees with her consent  Follow-up in 3 months. Continue to check weight and trying to get her BMI under 40.  Meds ordered this encounter  Medications  . HYDROcodone-acetaminophen (NORCO/VICODIN) 5-325 MG per tablet    Sig: Take 1 tablet by mouth every 8 (eight) hours as needed for moderate pain.    Dispense:  90 tablet    Refill:  0    Procedure note left knee injection verbal consent was obtained to inject left knee joint  Timeout was completed to confirm the site of injection  The medications used were 40 mg of Depo-Medrol and 1% lidocaine 3 cc  Anesthesia was provided by ethyl chloride and the  skin was prepped with alcohol.  After cleaning the skin with alcohol a 20-gauge needle was used to inject the left knee joint. There were no complications. A sterile bandage was applied.   Procedure note right knee injection verbal consent was obtained to inject right knee joint  Timeout was completed to confirm the site of injection  The medications used were 40 mg of Depo-Medrol and 1% lidocaine 3 cc  Anesthesia was provided by ethyl chloride and the skin was prepped with alcohol.  After cleaning the skin with alcohol a 20-gauge needle was used to inject the right knee joint. There were no complications. A sterile bandage was applied.

## 2014-12-17 ENCOUNTER — Emergency Department (HOSPITAL_COMMUNITY)
Admission: EM | Admit: 2014-12-17 | Discharge: 2014-12-17 | Disposition: A | Discharge status: Home / Self Care | Payer: PPO | Attending: Emergency Medicine | Admitting: Emergency Medicine

## 2014-12-17 ENCOUNTER — Emergency Department (HOSPITAL_COMMUNITY): Payer: PPO

## 2014-12-17 ENCOUNTER — Encounter (HOSPITAL_COMMUNITY): Payer: Self-pay | Admitting: Emergency Medicine

## 2014-12-17 DIAGNOSIS — M1990 Unspecified osteoarthritis, unspecified site: Secondary | ICD-10-CM | POA: Diagnosis not present

## 2014-12-17 DIAGNOSIS — G4733 Obstructive sleep apnea (adult) (pediatric): Secondary | ICD-10-CM | POA: Diagnosis not present

## 2014-12-17 DIAGNOSIS — Y998 Other external cause status: Secondary | ICD-10-CM | POA: Diagnosis not present

## 2014-12-17 DIAGNOSIS — G8929 Other chronic pain: Secondary | ICD-10-CM | POA: Insufficient documentation

## 2014-12-17 DIAGNOSIS — S8001XA Contusion of right knee, initial encounter: Secondary | ICD-10-CM | POA: Diagnosis not present

## 2014-12-17 DIAGNOSIS — Z9981 Dependence on supplemental oxygen: Secondary | ICD-10-CM | POA: Insufficient documentation

## 2014-12-17 DIAGNOSIS — J45909 Unspecified asthma, uncomplicated: Secondary | ICD-10-CM | POA: Diagnosis not present

## 2014-12-17 DIAGNOSIS — Z7951 Long term (current) use of inhaled steroids: Secondary | ICD-10-CM | POA: Insufficient documentation

## 2014-12-17 DIAGNOSIS — Y92241 Library as the place of occurrence of the external cause: Secondary | ICD-10-CM | POA: Diagnosis not present

## 2014-12-17 DIAGNOSIS — S161XXA Strain of muscle, fascia and tendon at neck level, initial encounter: Secondary | ICD-10-CM | POA: Insufficient documentation

## 2014-12-17 DIAGNOSIS — Z79899 Other long term (current) drug therapy: Secondary | ICD-10-CM | POA: Diagnosis not present

## 2014-12-17 DIAGNOSIS — Z8719 Personal history of other diseases of the digestive system: Secondary | ICD-10-CM | POA: Diagnosis not present

## 2014-12-17 DIAGNOSIS — W01198A Fall on same level from slipping, tripping and stumbling with subsequent striking against other object, initial encounter: Secondary | ICD-10-CM | POA: Diagnosis not present

## 2014-12-17 DIAGNOSIS — S4991XA Unspecified injury of right shoulder and upper arm, initial encounter: Secondary | ICD-10-CM | POA: Diagnosis not present

## 2014-12-17 DIAGNOSIS — Z791 Long term (current) use of non-steroidal anti-inflammatories (NSAID): Secondary | ICD-10-CM | POA: Diagnosis not present

## 2014-12-17 DIAGNOSIS — W19XXXA Unspecified fall, initial encounter: Secondary | ICD-10-CM

## 2014-12-17 DIAGNOSIS — S8002XA Contusion of left knee, initial encounter: Secondary | ICD-10-CM | POA: Diagnosis not present

## 2014-12-17 DIAGNOSIS — S0990XA Unspecified injury of head, initial encounter: Secondary | ICD-10-CM | POA: Diagnosis not present

## 2014-12-17 DIAGNOSIS — Y9389 Activity, other specified: Secondary | ICD-10-CM | POA: Insufficient documentation

## 2014-12-17 DIAGNOSIS — Z7982 Long term (current) use of aspirin: Secondary | ICD-10-CM | POA: Insufficient documentation

## 2014-12-17 DIAGNOSIS — S8000XA Contusion of unspecified knee, initial encounter: Secondary | ICD-10-CM

## 2014-12-17 DIAGNOSIS — F419 Anxiety disorder, unspecified: Secondary | ICD-10-CM | POA: Insufficient documentation

## 2014-12-17 DIAGNOSIS — Z792 Long term (current) use of antibiotics: Secondary | ICD-10-CM | POA: Diagnosis not present

## 2014-12-17 DIAGNOSIS — E669 Obesity, unspecified: Secondary | ICD-10-CM | POA: Insufficient documentation

## 2014-12-17 DIAGNOSIS — S199XXA Unspecified injury of neck, initial encounter: Secondary | ICD-10-CM | POA: Diagnosis present

## 2014-12-17 DIAGNOSIS — F329 Major depressive disorder, single episode, unspecified: Secondary | ICD-10-CM | POA: Insufficient documentation

## 2014-12-17 DIAGNOSIS — Z8639 Personal history of other endocrine, nutritional and metabolic disease: Secondary | ICD-10-CM | POA: Diagnosis not present

## 2014-12-17 NOTE — ED Provider Notes (Signed)
CSN: [id number]     Arrival date & time 12/17/14  1506 History   First MD Initiated Contact with Patient 12/17/14 1513     Chief Complaint  Patient presents with  . Fall     (Consider location/radiation/quality/duration/timing/severity/associated sxs/prior Treatment) Patient is a 69 y.o. female presenting with fall. The history is provided by the patient.  Fall This is a new problem. Associated symptoms include headaches. Pertinent negatives include no abdominal pain and no shortness of breath.   patient states that she was at [employer] and fell. She states she thinks that her feet got caught and she went down. Denies dizziness or lightheadedness before the fall. States she almost passed out when she hit her left side of the head on the wall. Also complaining of pain in her knees and her right shoulder. States she has chronic pain in her knees. States she took some pain medicine for this morning. Has not been able to or since the fall. No chest pain or trouble breathing.  Past Medical History  Diagnosis Date  . Mixed hyperlipidemia   . Osteoarthritis   . GERD (gastroesophageal reflux disease) 1995  . Asthma 2010  . Anxiety   . Depression   . Hoarseness   . Obstructive sleep apnea     using a cpap-severe  . Wears glasses    Past Surgical History  Procedure Laterality Date  . Rotator cuff repair Right 2005  . Cholecystectomy  1985   Family History  Problem Relation Age of Onset  . Cancer Father   . Cancer Mother   . Heart attack Mother   . Cancer Brother    Social History  Substance Use Topics  . Smoking status: Never Smoker   . Smokeless tobacco: Never Used  . Alcohol Use: No   OB History    No data available     Review of Systems  Constitutional: Negative for chills.  Respiratory: Negative for shortness of breath.   Cardiovascular: Negative for leg swelling.  Gastrointestinal: Negative for abdominal pain.  Genitourinary: Negative for dysuria.   Musculoskeletal: Positive for neck pain. Negative for back pain and joint swelling.  Skin: Negative for rash and wound.  Neurological: Positive for headaches.  Psychiatric/Behavioral: Negative for confusion.      Allergies  Cephalexin; Omeprazole; and Rofecoxib  Home Medications   Prior to Admission medications   Medication Sig Start Date End Date Taking? Authorizing Provider  acetaminophen (TYLENOL) 500 MG tablet Take 500 mg by mouth every 6 (six) hours as needed.    Historical Provider, MD  albuterol (PROVENTIL HFA;VENTOLIN HFA) 108 (90 BASE) MCG/ACT inhaler Inhale 2 puffs into the lungs every 6 (six) hours as needed for wheezing or shortness of breath. 07/27/14   Fayrene Helper, MD  aspirin 81 MG tablet Take 81 mg by mouth daily.    Historical Provider, MD  diclofenac sodium (VOLTAREN) 1 % GEL Apply twice daily to each knee 08/19/14   Fayrene Helper, MD  ergocalciferol (VITAMIN D2) 50000 UNITS capsule Take 1 capsule (50,000 Units total) by mouth once a week. One capsule once weekly 04/01/14   Fayrene Helper, MD  FLUoxetine (PROZAC) 20 MG capsule Take 1 capsule (20 mg total) by mouth daily. 07/27/14   Fayrene Helper, MD  furosemide (LASIX) 40 MG tablet Take 1 tablet (40 mg total) by mouth daily. 11/02/14   Fayrene Helper, MD  HYDROcodone-acetaminophen (NORCO/VICODIN) 5-325 MG per tablet Take 2 tablets by mouth every 4 (  four) hours as needed. 10/16/14   Fransico Meadow, PA-C  HYDROcodone-acetaminophen (NORCO/VICODIN) 5-325 MG per tablet Take 1 tablet by mouth every 8 (eight) hours as needed for moderate pain. 12/13/14   Carole Civil, MD  hydrOXYzine (ATARAX/VISTARIL) 25 MG tablet Take 1 tablet (25 mg total) by mouth 3 (three) times daily as needed. 11/18/14   Fayrene Helper, MD  mometasone (NASONEX) 50 MCG/ACT nasal spray Place 2 sprays into the nose daily. Patient taking differently: Place 2 sprays into the nose as needed.  08/19/14   Fayrene Helper, MD   montelukast (SINGULAIR) 10 MG tablet Take 1 tablet (10 mg total) by mouth at bedtime. 08/19/14   Fayrene Helper, MD  neomycin-polymyxin-hydrocortisone (CORTISPORIN) 3.5-10000-1 otic suspension Place 4 drops into the right ear 3 (three) times daily. 10/16/14   Fransico Meadow, PA-C  potassium chloride SA (K-DUR,KLOR-CON) 20 MEQ tablet Take 1 tablet (20 mEq total) by mouth 2 (two) times daily. 11/22/14   Fayrene Helper, MD  solifenacin (VESICARE) 5 MG tablet Take 1 tablet (5 mg total) by mouth daily. 07/27/14   Fayrene Helper, MD  Spacer/Aero-Holding Josiah Lobo DEVI Use with inhaler 02/26/14   Waldemar Dickens, MD  sulfamethoxazole-trimethoprim (BACTRIM DS,SEPTRA DS) 800-160 MG per tablet Take 1 tablet by mouth 2 (two) times daily. 09/15/14   Fayrene Helper, MD   BP 125/65 mmHg  Pulse 75  Temp(Src) 97.8 F (36.6 C) (Oral)  Resp 16  SpO2 100% Physical Exam  Constitutional: She is oriented to person, place, and time. She appears well-developed.  Patient is obese  HENT:  Head: Normocephalic.  Mild tenderness to left temporal area.  Neck:  Mild midline tenderness. No step-off or deformity.  Cardiovascular: Normal rate.   Pulmonary/Chest: Effort normal.  Abdominal: Soft.  Musculoskeletal: She exhibits tenderness.  Tenderness to right shoulder anteriorly and bilateral knees anteriorly. Decreased range of motion in the knees. No tenderness over feet or hips. No lumbar tenderness.  Neurological: She is alert and oriented to person, place, and time.  Skin: Skin is warm.    ED Course  Procedures (including critical care time) Labs Review Labs Reviewed - No data to display  Imaging Review Dg Shoulder Right  12/17/2014   CLINICAL DATA:  Status post fall yesterday with right shoulder pain.  EXAM: RIGHT SHOULDER - 2+ VIEW  COMPARISON:  None.  FINDINGS: There is no evidence of fracture or dislocation. Soft tissues are unremarkable.  IMPRESSION: No acute fracture or dislocation.    Electronically Signed   By: Abelardo Diesel M.D.   On: 12/17/2014 16:23   Ct Head Wo Contrast  12/17/2014   CLINICAL DATA:  Status post fall today. Left-sided face pain and headaches.  EXAM: CT HEAD WITHOUT CONTRAST  CT CERVICAL SPINE WITHOUT CONTRAST  TECHNIQUE: Multidetector CT imaging of the head and cervical spine was performed following the standard protocol without intravenous contrast. Multiplanar CT image reconstructions of the cervical spine were also generated.  COMPARISON:  February 23, 2014  FINDINGS: CT HEAD FINDINGS  There is no midline shift, hydrocephalus, or mass. No acute hemorrhage or acute transcortical infarct is identified. The bony calvarium is intact. The visualized sinuses are clear.  CT CERVICAL SPINE FINDINGS  There is no acute fracture dislocation. The prevertebral soft tissues are normal. Degenerative joint changes of cervical spine are identified with narrowed joint space and osteophyte formation. The lung apices are clear.  IMPRESSION: No focal acute intracranial abnormality identified.  No acute  fracture or dislocation of cervical spine. Degenerative joint changes of cervical spine.   Electronically Signed   By: Abelardo Diesel M.D.   On: 12/17/2014 16:38   Ct Cervical Spine Wo Contrast  12/17/2014   CLINICAL DATA:  Status post fall today. Left-sided face pain and headaches.  EXAM: CT HEAD WITHOUT CONTRAST  CT CERVICAL SPINE WITHOUT CONTRAST  TECHNIQUE: Multidetector CT imaging of the head and cervical spine was performed following the standard protocol without intravenous contrast. Multiplanar CT image reconstructions of the cervical spine were also generated.  COMPARISON:  February 23, 2014  FINDINGS: CT HEAD FINDINGS  There is no midline shift, hydrocephalus, or mass. No acute hemorrhage or acute transcortical infarct is identified. The bony calvarium is intact. The visualized sinuses are clear.  CT CERVICAL SPINE FINDINGS  There is no acute fracture dislocation. The prevertebral  soft tissues are normal. Degenerative joint changes of cervical spine are identified with narrowed joint space and osteophyte formation. The lung apices are clear.  IMPRESSION: No focal acute intracranial abnormality identified.  No acute fracture or dislocation of cervical spine. Degenerative joint changes of cervical spine.   Electronically Signed   By: Abelardo Diesel M.D.   On: 12/17/2014 16:38   Dg Knee Complete 4 Views Left  12/17/2014   CLINICAL DATA:  Status post fall with left knee pain  EXAM: LEFT KNEE - COMPLETE 4+ VIEW  COMPARISON:  Sep 08, 2014  FINDINGS: There is no evidence of fracture, dislocation, or joint effusion. There are degenerative joint changes with narrowed joint space and osteophyte formation. Soft tissues are unremarkable.  IMPRESSION: No acute fracture or dislocation. Osteoarthritic changes of left knee.   Electronically Signed   By: Abelardo Diesel M.D.   On: 12/17/2014 16:25   Dg Knee Complete 4 Views Right  12/17/2014   CLINICAL DATA:  Fall. Right knee injury and pain. Initial encounter.  EXAM: RIGHT KNEE - COMPLETE 4+ VIEW  COMPARISON:  09/08/2014  FINDINGS: There is no evidence of fracture, dislocation, or joint effusion. Tricompartmental osteoarthritis is seen, with most severe involvement of the medial and patellofemoral compartments. No other significant bone abnormality identified.  IMPRESSION: No acute findings.  Tricompartmental osteoarthritis, without significant change compared to prior exam.   Electronically Signed   By: Earle Gell M.D.   On: 12/17/2014 16:26   I have personally reviewed and evaluated these images and lab results as part of my medical decision-making.   EKG Interpretation None      MDM   Final diagnoses:  Fall, initial encounter  Cervical strain, initial encounter  Knee contusion, unspecified laterality, initial encounter    Patient with fall. Negative imaging. Will discharge home.    Davonna Belling, MD 12/18/14 [phone]

## 2014-12-17 NOTE — ED Notes (Signed)
Patient with no complaints at this time. Respirations even and unlabored. Skin warm/dry. Discharge instructions reviewed with patient at this time. Patient given opportunity to voice concerns/ask questions. Patient discharged at this time and left Emergency Department with steady gait.   

## 2014-12-17 NOTE — Discharge Instructions (Signed)

## 2014-12-17 NOTE — ED Notes (Signed)
Patient was at [employer] and had a fall. Unsure of what caused fall. Patient struck left side of head on concrete wall. C/o headache and right shoulder pain. Alert/oriented x 4. No distress. C-collar in place by EMS.

## 2014-12-20 ENCOUNTER — Encounter: Payer: Self-pay | Admitting: Family Medicine

## 2014-12-20 ENCOUNTER — Ambulatory Visit (INDEPENDENT_AMBULATORY_CARE_PROVIDER_SITE_OTHER): Payer: PPO | Admitting: Family Medicine

## 2014-12-20 VITALS — BP 134/86 | HR 86 | Resp 18 | Ht 65.5 in | Wt 265.1 lb

## 2014-12-20 DIAGNOSIS — E785 Hyperlipidemia, unspecified: Secondary | ICD-10-CM | POA: Diagnosis not present

## 2014-12-20 DIAGNOSIS — M25511 Pain in right shoulder: Secondary | ICD-10-CM | POA: Diagnosis not present

## 2014-12-20 DIAGNOSIS — F329 Major depressive disorder, single episode, unspecified: Secondary | ICD-10-CM

## 2014-12-20 DIAGNOSIS — F32A Depression, unspecified: Secondary | ICD-10-CM

## 2014-12-20 DIAGNOSIS — G4733 Obstructive sleep apnea (adult) (pediatric): Secondary | ICD-10-CM

## 2014-12-20 DIAGNOSIS — Z6841 Body Mass Index (BMI) 40.0 and over, adult: Secondary | ICD-10-CM

## 2014-12-20 DIAGNOSIS — Z9181 History of falling: Secondary | ICD-10-CM | POA: Diagnosis not present

## 2014-12-20 DIAGNOSIS — Z1159 Encounter for screening for other viral diseases: Secondary | ICD-10-CM

## 2014-12-20 DIAGNOSIS — M25561 Pain in right knee: Secondary | ICD-10-CM

## 2014-12-20 DIAGNOSIS — J4520 Mild intermittent asthma, uncomplicated: Secondary | ICD-10-CM

## 2014-12-20 DIAGNOSIS — M25562 Pain in left knee: Secondary | ICD-10-CM

## 2014-12-20 DIAGNOSIS — Z1211 Encounter for screening for malignant neoplasm of colon: Secondary | ICD-10-CM

## 2014-12-20 NOTE — Patient Instructions (Signed)
Initial wellness visit  in January , call if you  need me before  You are referred to dr Aline Brochure refollow up of shoulder and knee pain following the fall  PLEASE call Dr Olevia Perches office you NEED the colonoscopy  Fasting labs in January  Please work on weight loss for joint and general health  Cut back on fried and fatty foods, cholesterol  Was high in March  Use compression hose, if ineffective for leg swelling then you need to call to see vascular specialist  Flu vaccine in Sept  Neosporin for insect bites and bug spray to keep off bugs

## 2014-12-21 ENCOUNTER — Encounter (INDEPENDENT_AMBULATORY_CARE_PROVIDER_SITE_OTHER): Payer: Self-pay | Admitting: *Deleted

## 2014-12-28 ENCOUNTER — Telehealth: Payer: Self-pay | Admitting: *Deleted

## 2014-12-28 NOTE — Telephone Encounter (Signed)
Pt called lmom stating she is having back pain, neck pain, dizzyness pt said she seen Dr. Moshe Cipro not long ago, pt said she has found knots on her pt is requesting something to be called in . Please advise [phone]

## 2014-12-28 NOTE — Telephone Encounter (Signed)
Called patient and voicemial not set up

## 2015-01-03 NOTE — Telephone Encounter (Signed)
Called patient and no voicemail set up to leave mssage

## 2015-01-11 ENCOUNTER — Telehealth: Payer: Self-pay | Admitting: *Deleted

## 2015-01-11 NOTE — Telephone Encounter (Signed)
Pt called stating she has been having head aches everyday last week [phone]

## 2015-01-12 NOTE — Telephone Encounter (Signed)
Called and left message for patient to return call.  

## 2015-01-16 ENCOUNTER — Other Ambulatory Visit: Payer: Self-pay | Admitting: Family Medicine

## 2015-01-16 DIAGNOSIS — G4489 Other headache syndrome: Secondary | ICD-10-CM

## 2015-01-16 MED ORDER — BUTALBITAL-APAP-CAFFEINE 50-325-40 MG PO TABS: ORAL_TABLET | ORAL | Status: DC

## 2015-01-16 NOTE — Telephone Encounter (Signed)
firicet prescribed, pls send and let her knwo also referred to neurology

## 2015-01-16 NOTE — Telephone Encounter (Signed)
States she has been having headaches since her fall at [employer] and last week she had a bad headache everyday. Feels the most pain in her temples and in the back of her head. Rates pain a 10. Doesn't want more testing since her CT head was normal last month but wants something called in for it/referral if necessary.

## 2015-01-17 NOTE — Telephone Encounter (Signed)
Pt aware.

## 2015-02-01 DIAGNOSIS — R296 Repeated falls: Secondary | ICD-10-CM | POA: Insufficient documentation

## 2015-02-01 NOTE — Assessment & Plan Note (Signed)
Re educated re need for continual use of CPAP

## 2015-02-01 NOTE — Assessment & Plan Note (Signed)
Increased and uncontrolled bilateral knee pain and instability, had recent fall, needs ortho eval and management

## 2015-02-01 NOTE — Progress Notes (Signed)
   Subjective:    Patient ID: Angela Chase, female    DOB: Feb 28, 1946, 69 y.o.   MRN: [id number]  HPI Pt in for follow up from ED visit on 12/17/2014, when she presented after falling in [employer], imaging at the time was negative for boy injury, she had tenderness and swelling of both knees anterior aspect and also the right shoulder. Still c/o pain , soreness, limitation in safe mobility as well as limitaion in right shoulder mobility. She does have chronic severe  Osteoarthritis of the knees and had been evaluated by orthopedics a few days before the accident  Review of Systems See HPI Denies recent fever or chills. Denies sinus pressure, nasal congestion, ear pain or sore throat. Denies chest congestion, productive cough or wheezing. Denies chest pains, palpitations and leg swelling Denies abdominal pain, nausea, vomiting,diarrhea or constipation.   Denies dysuria, frequency, hesitancy or incontinence  Denies headaches, seizures, numbness, or tingling. Denies  uncontrolled depression, anxiety or insomnia. Denies skin break down or rash.        Objective:   Physical Exam BP 134/86 mmHg  Pulse 86  Resp 18  Ht 5' 5.5" (1.664 m)  Wt 265 lb 1.9 oz (120.258 kg)  BMI 43.43 kg/m2  SpO2 97% Patient alert and oriented and in no cardiopulmonary distress.  HEENT: No facial asymmetry, EOMI,   oropharynx pink and moist.  Neck supple no JVD, no mass.  Chest: Clear to auscultation bilaterally.  CVS: S1, S2 no murmurs, no S3.Regular rate.  ABD: Soft non tender.   Ext: No edema  MS: Decreased  ROM lumbar  Spine right  shoulder, hips and both  knees. Tender over anterior right shoulder and both knees, there is chronic deformity and crepitus in both knees also Skin: Intact, no ulcerations or rash noted.  Psych: Good eye contact, normal affect. Memory intact not anxious or depressed appearing.  CNS: CN 2-12 intact, power,  normal throughout.no focal deficits noted.       Assessment & Plan:  Shoulder pain, right Increased and uncontrolled pain with limitation in movement , aggravated by recent trauma, refer to ortho for further management  Bilateral knee pain Increased and uncontrolled bilateral knee pain and instability, had recent fall, needs ortho eval and management  Depression Controlled, no change in medication   Intrinsic asthma Controlled, no change in medication   Morbid obesity with BMI of 40.0-44.9, adult Deteriorated. Patient re-educated about  the importance of commitment to a  minimum of 150 minutes of exercise per week.  The importance of healthy food choices with portion control discussed. Encouraged to start a food diary, count calories and to consider  joining a support group. Sample diet sheets offered. Goals set by the patient for the next several months.   Weight /BMI 12/20/2014 12/13/2014 10/16/2014  WEIGHT 265 lb 1.9 oz 269 lb 260 lb  HEIGHT 5' 5.5" 5\' 5"  5' 5.5"  BMI 43.43 kg/m2 44.76 kg/m2 42.59 kg/m2    Current exercise per week 60 minutes.   OSA (obstructive sleep apnea) Re educated re need for continual use of CPAP  H/O fall Sot tissue injury only following fall on 12/17/2014 in [employer], accidental, seen in ED on day of fall , in office for follow up eval, pain is lessening , however still present. Of note pt  Has chronic severe osteoarthritis of her knees She will continue to f/u with orthopedics

## 2015-02-01 NOTE — Assessment & Plan Note (Signed)
Increased and uncontrolled pain with limitation in movement , aggravated by recent trauma, refer to ortho for further management

## 2015-02-01 NOTE — Assessment & Plan Note (Signed)
Controlled, no change in medication  

## 2015-02-01 NOTE — Assessment & Plan Note (Signed)
Deteriorated. Patient re-educated about  the importance of commitment to a  minimum of 150 minutes of exercise per week.  The importance of healthy food choices with portion control discussed. Encouraged to start a food diary, count calories and to consider  joining a support group. Sample diet sheets offered. Goals set by the patient for the next several months.   Weight /BMI 12/20/2014 12/13/2014 10/16/2014  WEIGHT 265 lb 1.9 oz 269 lb 260 lb  HEIGHT 5' 5.5" 5\' 5"  5' 5.5"  BMI 43.43 kg/m2 44.76 kg/m2 42.59 kg/m2    Current exercise per week 60 minutes.

## 2015-02-01 NOTE — Assessment & Plan Note (Signed)
Sot tissue injury only following fall on 12/17/2014 in [employer], accidental, seen in ED on day of fall , in office for follow up eval, pain is lessening , however still present. Of note pt  Has chronic severe osteoarthritis of her knees She will continue to f/u with orthopedics

## 2015-02-20 ENCOUNTER — Other Ambulatory Visit: Payer: Self-pay | Admitting: Family Medicine

## 2015-02-21 ENCOUNTER — Other Ambulatory Visit: Payer: Self-pay

## 2015-02-21 MED ORDER — BUTALBITAL-APAP-CAFFEINE 50-325-40 MG PO TABS: ORAL_TABLET | ORAL | Status: DC

## 2015-03-02 ENCOUNTER — Encounter: Payer: Self-pay | Admitting: Family Medicine

## 2015-03-02 ENCOUNTER — Encounter: Payer: PPO | Admitting: Family Medicine

## 2015-03-14 ENCOUNTER — Encounter: Payer: Self-pay | Admitting: *Deleted

## 2015-03-14 ENCOUNTER — Ambulatory Visit: Payer: PPO | Admitting: Orthopedic Surgery

## 2015-03-16 ENCOUNTER — Ambulatory Visit (INDEPENDENT_AMBULATORY_CARE_PROVIDER_SITE_OTHER): Payer: PPO | Admitting: Cardiology

## 2015-03-16 ENCOUNTER — Encounter: Payer: Self-pay | Admitting: Cardiology

## 2015-03-16 ENCOUNTER — Telehealth: Payer: Self-pay | Admitting: Family Medicine

## 2015-03-16 VITALS — BP 126/76 | HR 80 | Ht 65.0 in | Wt 275.0 lb

## 2015-03-16 DIAGNOSIS — E785 Hyperlipidemia, unspecified: Secondary | ICD-10-CM

## 2015-03-16 DIAGNOSIS — R600 Localized edema: Secondary | ICD-10-CM

## 2015-03-16 DIAGNOSIS — Z136 Encounter for screening for cardiovascular disorders: Secondary | ICD-10-CM

## 2015-03-16 DIAGNOSIS — M174 Other bilateral secondary osteoarthritis of knee: Secondary | ICD-10-CM

## 2015-03-16 NOTE — Patient Instructions (Signed)
Medication Instructions:  Your physician recommends that you continue on your current medications as directed. Please refer to the Current Medication list given to you today.   Labwork: none  Testing/Procedures: Your physician has requested that you have an echocardiogram. Echocardiography is a painless test that uses sound waves to create images of your heart. It provides your doctor with information about the size and shape of your heart and how well your heart's chambers and valves are working. This procedure takes approximately one hour. There are no restrictions for this procedure.    Follow-Up: Will call with results  Any Other Special Instructions Will Be Listed Below (If Applicable).     If you need a refill on your cardiac medications before your next appointment, please call your pharmacy.  Thanks for choosing [facility]!!!

## 2015-03-16 NOTE — Telephone Encounter (Signed)
Called and the phone rang and went to voicemail and there was no option to leave a message

## 2015-03-16 NOTE — Telephone Encounter (Signed)
Angela Chase is asking for the nurse she has a question

## 2015-03-16 NOTE — Progress Notes (Signed)
Cardiology Office Note  Date: 03/16/2015   ID: Arrington, Krebsbach 02-Mar-1946, MRN [id number]  PCP: Tula Nakayama, MD  Primary Cardiologist: Rozann Lesches, MD   Chief Complaint  Patient presents with  . Cardiac follow-up    History of Present Illness: Angela Chase is a 69 y.o. female presenting back to the office for a follow-up visit. She was last seen in March 2015, at which time an echocardiogram was recommended, however she did not present for this study. She comes in today for a follow-up evaluation, not reporting any specific chest pain or shortness of breath however. She does have chronic trouble with leg edema and uses Lasix intermittently.  She states that she is studying at a theology program in [place]. She has been working quite hard on this, reading a lot. She states that she takes her medications "most of the time," but it does not sound like she is entirely compliant. She follows with Dr. Moshe Cipro on a regular basis.  Follow-up ECG today shows sinus rhythm with low voltage.   Past Medical History  Diagnosis Date  . Mixed hyperlipidemia   . Osteoarthritis   . GERD (gastroesophageal reflux disease) 1995  . Asthma 2010  . Anxiety   . Depression   . Hoarseness   . Obstructive sleep apnea     On CPAP  . Wears glasses     Current Outpatient Prescriptions  Medication Sig Dispense Refill  . acetaminophen (TYLENOL) 500 MG tablet Take 500 mg by mouth every 6 (six) hours as needed.    Marland Kitchen albuterol (PROVENTIL HFA;VENTOLIN HFA) 108 (90 BASE) MCG/ACT inhaler Inhale 2 puffs into the lungs every 6 (six) hours as needed for wheezing or shortness of breath. 18 g 0  . aspirin 81 MG tablet Take 81 mg by mouth daily.    . butalbital-acetaminophen-caffeine (FIORICET, ESGIC) 50-325-40 MG tablet One tablet twice daily as needed, for headache 30 tablet 1  . diclofenac sodium (VOLTAREN) 1 % GEL Apply twice daily to each knee 100 g 1  . ergocalciferol (VITAMIN D2)  50000 UNITS capsule Take 1 capsule (50,000 Units total) by mouth once a week. One capsule once weekly 4 capsule 5  . FLUoxetine (PROZAC) 20 MG capsule Take 1 capsule (20 mg total) by mouth daily. 30 capsule 3  . furosemide (LASIX) 40 MG tablet Take 1 tablet (40 mg total) by mouth daily. 30 tablet 1  . HYDROcodone-acetaminophen (NORCO/VICODIN) 5-325 MG per tablet Take 2 tablets by mouth every 4 (four) hours as needed. 16 tablet 0  . hydrOXYzine (ATARAX/VISTARIL) 25 MG tablet Take 1 tablet (25 mg total) by mouth 3 (three) times daily as needed. 30 tablet 1  . mometasone (NASONEX) 50 MCG/ACT nasal spray Place 2 sprays into the nose daily. (Patient taking differently: Place 2 sprays into the nose as needed. ) 17 g 12  . montelukast (SINGULAIR) 10 MG tablet Take 1 tablet (10 mg total) by mouth at bedtime. 30 tablet 3  . potassium chloride SA (K-DUR,KLOR-CON) 20 MEQ tablet Take 1 tablet (20 mEq total) by mouth 2 (two) times daily. 60 tablet 1  . solifenacin (VESICARE) 5 MG tablet Take 1 tablet (5 mg total) by mouth daily. 30 tablet 4  . Spacer/Aero-Holding Dorise Bullion Use with inhaler 1 each 1  . Vitamin D, Ergocalciferol, (DRISDOL) 50000 UNITS CAPS capsule TAKE 1 CAPSULE BY MOUTH EVERY 7 DAYS 4 capsule 5   No current facility-administered medications for this visit.    Allergies:  Cephalexin; Omeprazole; and Rofecoxib   Social History: The patient  reports that she has never smoked. She has never used smokeless tobacco. She reports that she does not drink alcohol or use illicit drugs.   ROS:  Please see the history of present illness. Otherwise, complete review of systems is positive for arthritic pains.  All other systems are reviewed and negative.   Physical Exam: VS:  BP 126/76 mmHg  Pulse 80  Ht 5\' 5"  (1.651 m)  Wt 275 lb (124.739 kg)  BMI 45.76 kg/m2  SpO2 98%, BMI Body mass index is 45.76 kg/(m^2).  Wt Readings from Last 3 Encounters:  03/16/15 275 lb (124.739 kg)  12/20/14 265 lb  1.9 oz (120.258 kg)  12/13/14 269 lb (122.018 kg)     Obese woman, appears comfortable at rest.  HEENT: Conjunctiva and lids normal, oropharynx clear.  Neck: Supple, no elevated JVP or carotid bruits, no thyromegaly.  Lungs: Decreased breath sounds but no wheezes, nonlabored breathing at rest.  Cardiac: Regular rate and rhythm, no S3 or significant systolic murmur, no pericardial rub.  Abdomen: Soft, nontender, bowel sounds present, no guarding or rebound.  Extremities: 1-2+ leg edema, distal pulses 1-2+.    ECG: ECG is ordered today.  Recent Labwork: 07/26/2014: ALT 33; AST 31; BUN 10; Creat 0.58; Hemoglobin 13.1; Platelets 236; Potassium 4.0; Sodium 143; TSH 2.765     Component Value Date/Time   CHOL 220* 07/26/2014 1210   TRIG 63 07/26/2014 1210   HDL 81 07/26/2014 1210   CHOLHDL 2.7 07/26/2014 1210   VLDL 13 07/26/2014 1210   LDLCALC 126* 07/26/2014 1210    Other Studies Reviewed Today:  Lexiscan Cardiolite 10/09/2011: Radionuclide Data: One-day rest/stress protocol performed with 10/30 mCi of Tc-38m Myoview.  Stress Data: Regadenoson infusion resulted in headache and abdominal discomfort. There was a moderate increase in heart rate and minimal decrease in systolic blood pressure with drug administration. No arrhythmias noted.  EKG: Normal sinus rhythm; borderline left atrial abnormality; delayed R-wave progression; borderline low voltage. No significant change with pharmacologic stress.  Scintigraphic Data: Acquisition notable for moderate diaphragmatic and mild to moderate breast attenuation. Moderately intense GI activity was adjacent to the cardiac apex. Left ventricular size was normal. On tomographic images reconstructed in standard planes, there was uniform and normal uptake of tracer in all myocardial segments. The rest images were unchanged. The gated reconstruction demonstrated normal regional and global LV systolic function as well as  normal systolic accentuation of activity throughout. Estimated ejection fraction was 61%.  IMPRESSION: Negative pharmacologic stress nuclear myocardial study revealing no evidence for myocardial ischemia or infarction and normal left ventricular systolic function. Other findings as noted.  ASSESSMENT AND PLAN:  1. Chronic recurring leg edema. She uses Lasix intermittently, should probably do this on a daily basis. We discussed reducing her water intake which at this point seems excessive. She also agrees to follow-up with an echocardiogram to assess LVEF. We will call her with the results.  2. Mixed hyperlipidemia, followed by Dr. Moshe Cipro. LDL 126.  Current medicines were reviewed at length with the patient today.   Orders Placed This Encounter  Procedures  . EKG 12-Lead  . EKG 12-Lead  . Echocardiogram    Disposition: Call with results.   Signed, Satira Sark, MD, [facility] 03/16/2015 12:11 PM    [place] [facility] [address], [place], [place] [address] Phone: [phone]; Fax: [phone]

## 2015-03-17 MED ORDER — DICLOFENAC SODIUM 1 % TD GEL: TRANSDERMAL | Status: DC

## 2015-03-17 NOTE — Telephone Encounter (Signed)
voltaren refilled

## 2015-03-20 ENCOUNTER — Other Ambulatory Visit (HOSPITAL_COMMUNITY): Payer: PPO

## 2015-03-21 ENCOUNTER — Ambulatory Visit (HOSPITAL_COMMUNITY): Admission: RE | Admit: 2015-03-21 | Payer: PPO | Source: Ambulatory Visit

## 2015-03-27 ENCOUNTER — Telehealth: Payer: Self-pay | Admitting: Family Medicine

## 2015-03-27 ENCOUNTER — Other Ambulatory Visit: Payer: Self-pay

## 2015-03-27 DIAGNOSIS — J4520 Mild intermittent asthma, uncomplicated: Secondary | ICD-10-CM

## 2015-03-27 MED ORDER — MONTELUKAST SODIUM 10 MG PO TABS: 10.0000 mg | ORAL_TABLET | Freq: Every day | ORAL | Status: DC

## 2015-03-27 MED ORDER — MOMETASONE FUROATE 50 MCG/ACT NA SUSP: 2.0000 | Freq: Every day | NASAL | Status: DC

## 2015-03-27 MED ORDER — AZITHROMYCIN 250 MG PO TABS: ORAL_TABLET | ORAL | Status: DC

## 2015-03-27 NOTE — Telephone Encounter (Signed)
Patient is asking to speak to [facility] about Medication before its refilled

## 2015-03-27 NOTE — Telephone Encounter (Signed)
pls send a z pack x 1 , but advise she needs to keep appt in am

## 2015-03-27 NOTE — Telephone Encounter (Signed)
Spoke with patient and she states that she has had cough with congestion x 4 days.   Had fever on day 1.  Offered appt for tomorrow but she would like to know if anything can be prescribed prior.  Please advise.     Refills sent in for Flonase and Singulair.

## 2015-03-27 NOTE — Telephone Encounter (Signed)
Patient aware and med sent and will keep appt tomorrow

## 2015-03-28 ENCOUNTER — Ambulatory Visit (INDEPENDENT_AMBULATORY_CARE_PROVIDER_SITE_OTHER): Payer: PPO | Admitting: Family Medicine

## 2015-03-28 ENCOUNTER — Encounter: Payer: Self-pay | Admitting: Family Medicine

## 2015-03-28 VITALS — BP 144/80 | HR 90 | Temp 98.7°F | Resp 18 | Ht 65.5 in | Wt 278.0 lb

## 2015-03-28 DIAGNOSIS — Z6841 Body Mass Index (BMI) 40.0 and over, adult: Secondary | ICD-10-CM

## 2015-03-28 DIAGNOSIS — J45901 Unspecified asthma with (acute) exacerbation: Secondary | ICD-10-CM | POA: Diagnosis not present

## 2015-03-28 DIAGNOSIS — J0100 Acute maxillary sinusitis, unspecified: Secondary | ICD-10-CM

## 2015-03-28 DIAGNOSIS — J209 Acute bronchitis, unspecified: Secondary | ICD-10-CM | POA: Diagnosis not present

## 2015-03-28 DIAGNOSIS — F329 Major depressive disorder, single episode, unspecified: Secondary | ICD-10-CM | POA: Diagnosis not present

## 2015-03-28 DIAGNOSIS — F32A Depression, unspecified: Secondary | ICD-10-CM

## 2015-03-28 MED ORDER — IPRATROPIUM BROMIDE 0.02 % IN SOLN
0.5000 mg | Freq: Once | RESPIRATORY_TRACT | Status: AC
  Administered 2015-03-28: 0.5 mg via RESPIRATORY_TRACT

## 2015-03-28 MED ORDER — PREDNISONE 5 MG (21) PO TBPK: 5.0000 mg | ORAL_TABLET | ORAL | Status: DC

## 2015-03-28 MED ORDER — ALBUTEROL SULFATE (2.5 MG/3ML) 0.083% IN NEBU: 2.5000 mg | INHALATION_SOLUTION | Freq: Four times a day (QID) | RESPIRATORY_TRACT | Status: DC | PRN

## 2015-03-28 MED ORDER — IPRATROPIUM BROMIDE 0.02 % IN SOLN: 0.5000 mg | Freq: Four times a day (QID) | RESPIRATORY_TRACT | Status: DC | PRN

## 2015-03-28 MED ORDER — PROMETHAZINE-DM 6.25-15 MG/5ML PO SYRP: ORAL_SOLUTION | ORAL | Status: DC

## 2015-03-28 MED ORDER — ALBUTEROL SULFATE (2.5 MG/3ML) 0.083% IN NEBU
2.5000 mg | INHALATION_SOLUTION | Freq: Once | RESPIRATORY_TRACT | Status: AC
  Administered 2015-03-28: 2.5 mg via RESPIRATORY_TRACT

## 2015-03-28 MED ORDER — METHYLPREDNISOLONE ACETATE 80 MG/ML IJ SUSP
80.0000 mg | Freq: Once | INTRAMUSCULAR | Status: AC
  Administered 2015-03-28: 80 mg via INTRAMUSCULAR

## 2015-03-28 MED ORDER — BUDESONIDE-FORMOTEROL FUMARATE 160-4.5 MCG/ACT IN AERO: 2.0000 | INHALATION_SPRAY | Freq: Two times a day (BID) | RESPIRATORY_TRACT | Status: DC

## 2015-03-28 MED ORDER — BENZONATATE 100 MG PO CAPS: 100.0000 mg | ORAL_CAPSULE | Freq: Two times a day (BID) | ORAL | Status: DC | PRN

## 2015-03-28 NOTE — Patient Instructions (Signed)
Annual wellness in January, call if you need me sooner please  Return for flu vaccine in 3 weeks  You are treated for asthma flare, acute maxillary sinusitis and bronchitis. Breathing treatment and depo medrol injection in office today  Please fill scripts sent today, tessalon perles, prednisone and phenergan DM cough syrup  Since having a flare , use breathing treatment, duoneb, 3 times daily , every 8 hrs for next 2 to 3 days, then twice daily for about 2 days, then only use if still needed

## 2015-03-28 NOTE — Assessment & Plan Note (Signed)
Depo medrol and duoneb in office, pred 5 mg dose pack x 6 days and singulair

## 2015-03-28 NOTE — Progress Notes (Signed)
   Subjective:    Patient ID: Angela Chase, female    DOB: 1945-12-16, 69 y.o.   MRN: [id number]  HPI 1 week h/o worsening head and chest congestion, associated with fever and chills intermittently. Nasal drainage has thickened , and is yellowish green, and at times bloody. Sputum is thick and yellow. C/o increased wheezing and shortness of breath since symptom onset C/o bilateral ear pressure, denies hearing loss and sore throat. Increasing fatigue , poor appetitie and sleep disturbed by cough. No improvement with OTC medication.    Review of Systems See HPI . Denies chest pains, palpitations and leg swelling Denies abdominal pain, nausea, vomiting,diarrhea or constipation.   Denies dysuria, frequency, hesitancy or incontinence. C/o increased s joint pain, swelling and limitation in mobility.Has severe osteoarthritis in knees Denies headaches, seizures, numbness, or tingling. Denies depression, anxiety or insomnia. Denies skin break down or rash.        Objective:   Physical Exam BP 144/80 mmHg  Pulse 90  Temp(Src) 98.7 F (37.1 C)  Resp 18  Ht 5' 5.5" (1.664 m)  Wt 278 lb (126.1 kg)  BMI 45.54 kg/m2  SpO2 93% Patient alert and oriented and in mild cardiopulmonary distress.  HEENT: No facial asymmetry, EOMI,   oropharynx pink and moist.  Neck supple no JVD, no mass. Maxillary sinus tenderness, TM clear, anterior cervical adenitis Chest: decresed air entry , bilateral wheezes and scattered crackles  CVS: S1, S2 no murmurs, no S3.Regular rate.  ABD: Soft non tender.   Ext: No edema  MS: Adequate ROM spine, shoulders, hips and knees.  Skin: Intact, no ulcerations or rash noted.  Psych: Good eye contact, normal affect. Memory intact not anxious or depressed appearing.  CNS: CN 2-12 intact, power,  normal throughout.no focal deficits noted.        Assessment & Plan:  Asthma flare Depo medrol and duoneb in office, pred 5 mg dose pack x 6 days and  singulair   Acute bronchitis Decongestant and antibiotic course prescribed, also cough suppressant  Depression Controlled, no change in medication   Morbid obesity with BMI of 40.0-44.9, adult Deteriorated. Patient re-educated about  the importance of commitment to a  minimum of 150 minutes of exercise per week.  The importance of healthy food choices with portion control discussed. Encouraged to start a food diary, count calories and to consider  joining a support group. Sample diet sheets offered. Goals set by the patient for the next several months.   Weight /BMI 03/28/2015 03/16/2015 12/20/2014  WEIGHT 278 lb 275 lb 265 lb 1.9 oz  HEIGHT 5' 5.5" 5\' 5"  5' 5.5"  BMI 45.54 kg/m2 45.76 kg/m2 43.43 kg/m2    Current exercise per week 30 minutes.   Acute sinusitis z pack prescribed, and pt reminded of the need to commit to daily use of allergy medications

## 2015-04-03 DIAGNOSIS — J019 Acute sinusitis, unspecified: Secondary | ICD-10-CM | POA: Insufficient documentation

## 2015-04-03 NOTE — Assessment & Plan Note (Signed)
Controlled, no change in medication  

## 2015-04-03 NOTE — Assessment & Plan Note (Signed)
Decongestant and antibiotic course prescribed, also cough suppressant

## 2015-04-03 NOTE — Assessment & Plan Note (Signed)
Deteriorated. Patient re-educated about  the importance of commitment to a  minimum of 150 minutes of exercise per week.  The importance of healthy food choices with portion control discussed. Encouraged to start a food diary, count calories and to consider  joining a support group. Sample diet sheets offered. Goals set by the patient for the next several months.   Weight /BMI 03/28/2015 03/16/2015 12/20/2014  WEIGHT 278 lb 275 lb 265 lb 1.9 oz  HEIGHT 5' 5.5" 5\' 5"  5' 5.5"  BMI 45.54 kg/m2 45.76 kg/m2 43.43 kg/m2    Current exercise per week 30 minutes.

## 2015-04-03 NOTE — Assessment & Plan Note (Signed)
z pack prescribed, and pt reminded of the need to commit to daily use of allergy medications

## 2015-05-04 ENCOUNTER — Ambulatory Visit: Payer: PPO | Admitting: Adult Health

## 2015-05-12 ENCOUNTER — Telehealth: Payer: Self-pay | Admitting: Family Medicine

## 2015-05-12 ENCOUNTER — Other Ambulatory Visit: Payer: Self-pay

## 2015-05-12 MED ORDER — POTASSIUM CHLORIDE CRYS ER 20 MEQ PO TBCR: 20.0000 meq | EXTENDED_RELEASE_TABLET | Freq: Two times a day (BID) | ORAL | Status: DC

## 2015-05-12 MED ORDER — VITAMIN D (ERGOCALCIFEROL) 1.25 MG (50000 UNIT) PO CAPS: ORAL_CAPSULE | ORAL | Status: DC

## 2015-05-12 NOTE — Telephone Encounter (Signed)
Medications refilled

## 2015-05-12 NOTE — Telephone Encounter (Signed)
[name] is asking for a refill on her medications potassium chloride SA (K-DUR,KLOR-CON) 20 MEQ tablet  AND Vitamin D, Ergocalciferol, (DRISDOL) 50000 UNITS CAPS capsule she also states that she is about out of the cough med that Dr. Moshe Cipro called in for her, she is better but she states that she is still coughing, please advise?

## 2015-05-15 ENCOUNTER — Telehealth: Payer: Self-pay | Admitting: Family Medicine

## 2015-05-15 NOTE — Telephone Encounter (Signed)
Patient is stating that she has been up all night with chest pain, Rt arm pain, back pain, dizziness. She is asking for an immediate returned call

## 2015-05-15 NOTE — Telephone Encounter (Signed)
Called and left message for patient to return call.   Noted that she does have an appointment scheduled for tomorrow.

## 2015-05-16 ENCOUNTER — Telehealth: Payer: Self-pay | Admitting: Family Medicine

## 2015-05-16 ENCOUNTER — Encounter: Payer: Self-pay | Admitting: Family Medicine

## 2015-05-16 ENCOUNTER — Ambulatory Visit (INDEPENDENT_AMBULATORY_CARE_PROVIDER_SITE_OTHER): Payer: PPO | Admitting: Family Medicine

## 2015-05-16 VITALS — BP 130/84 | HR 96 | Resp 18 | Ht 65.5 in | Wt 273.0 lb

## 2015-05-16 DIAGNOSIS — J4520 Mild intermittent asthma, uncomplicated: Secondary | ICD-10-CM

## 2015-05-16 DIAGNOSIS — Z1211 Encounter for screening for malignant neoplasm of colon: Secondary | ICD-10-CM | POA: Diagnosis not present

## 2015-05-16 DIAGNOSIS — F329 Major depressive disorder, single episode, unspecified: Secondary | ICD-10-CM

## 2015-05-16 DIAGNOSIS — F32A Depression, unspecified: Secondary | ICD-10-CM

## 2015-05-16 DIAGNOSIS — J45901 Unspecified asthma with (acute) exacerbation: Secondary | ICD-10-CM | POA: Diagnosis not present

## 2015-05-16 DIAGNOSIS — M174 Other bilateral secondary osteoarthritis of knee: Secondary | ICD-10-CM

## 2015-05-16 DIAGNOSIS — Z0000 Encounter for general adult medical examination without abnormal findings: Secondary | ICD-10-CM

## 2015-05-16 DIAGNOSIS — Z23 Encounter for immunization: Secondary | ICD-10-CM | POA: Diagnosis not present

## 2015-05-16 DIAGNOSIS — R296 Repeated falls: Secondary | ICD-10-CM

## 2015-05-16 DIAGNOSIS — R079 Chest pain, unspecified: Secondary | ICD-10-CM

## 2015-05-16 DIAGNOSIS — R600 Localized edema: Secondary | ICD-10-CM

## 2015-05-16 MED ORDER — FLUOXETINE HCL 20 MG PO CAPS: 20.0000 mg | ORAL_CAPSULE | Freq: Every day | ORAL | Status: DC

## 2015-05-16 MED ORDER — MOMETASONE FUROATE 50 MCG/ACT NA SUSP: 2.0000 | Freq: Every day | NASAL | Status: DC

## 2015-05-16 MED ORDER — MONTELUKAST SODIUM 10 MG PO TABS: 10.0000 mg | ORAL_TABLET | Freq: Every day | ORAL | Status: DC

## 2015-05-16 MED ORDER — BUDESONIDE-FORMOTEROL FUMARATE 160-4.5 MCG/ACT IN AERO: 2.0000 | INHALATION_SPRAY | Freq: Two times a day (BID) | RESPIRATORY_TRACT | Status: DC

## 2015-05-16 NOTE — Telephone Encounter (Signed)
Patient called and confirmed appointment for today

## 2015-05-16 NOTE — Patient Instructions (Signed)
F/u in 2 month, call if you need me before  Need to use singulair, symbicort and nasonex eVERY day , albuterol for wheezing and coughing  Fluoxetine daily  You are referred to lung and heart specialist  Flu vaccine today  TOO MANY FALLS, cane prescription today    Fall Prevention in the Home  Falls can cause injuries. They can happen to people of all ages. There are many things you can do to make your home safe and to help prevent falls.  WHAT CAN I DO ON THE OUTSIDE OF MY HOME?  Regularly fix the edges of walkways and driveways and fix any cracks.  Remove anything that might make you trip as you walk through a door, such as a raised step or threshold.  Trim any bushes or trees on the path to your home.  Use bright outdoor lighting.  Clear any walking paths of anything that might make someone trip, such as rocks or tools.  Regularly check to see if handrails are loose or broken. Make sure that both sides of any steps have handrails.  Any raised decks and porches should have guardrails on the edges.  Have any leaves, snow, or ice cleared regularly.  Use sand or salt on walking paths during winter.  Clean up any spills in your garage right away. This includes oil or grease spills. WHAT CAN I DO IN THE BATHROOM?   Use night lights.  Install grab bars by the toilet and in the tub and shower. Do not use towel bars as grab bars.  Use non-skid mats or decals in the tub or shower.  If you need to sit down in the shower, use a plastic, non-slip stool.  Keep the floor dry. Clean up any water that spills on the floor as soon as it happens.  Remove soap buildup in the tub or shower regularly.  Attach bath mats securely with double-sided non-slip rug tape.  Do not have throw rugs and other things on the floor that can make you trip. WHAT CAN I DO IN THE BEDROOM?  Use night lights.  Make sure that you have a light by your bed that is easy to reach.  Do not use any sheets  or blankets that are too big for your bed. They should not hang down onto the floor.  Have a firm chair that has side arms. You can use this for support while you get dressed.  Do not have throw rugs and other things on the floor that can make you trip. WHAT CAN I DO IN THE KITCHEN?  Clean up any spills right away.  Avoid walking on wet floors.  Keep items that you use a lot in easy-to-reach places.  If you need to reach something above you, use a strong step stool that has a grab bar.  Keep electrical cords out of the way.  Do not use floor polish or wax that makes floors slippery. If you must use wax, use non-skid floor wax.  Do not have throw rugs and other things on the floor that can make you trip. WHAT CAN I DO WITH MY STAIRS?  Do not leave any items on the stairs.  Make sure that there are handrails on both sides of the stairs and use them. Fix handrails that are broken or loose. Make sure that handrails are as long as the stairways.  Check any carpeting to make sure that it is firmly attached to the stairs. Fix any carpet that  is loose or worn.  Avoid having throw rugs at the top or bottom of the stairs. If you do have throw rugs, attach them to the floor with carpet tape.  Make sure that you have a light switch at the top of the stairs and the bottom of the stairs. If you do not have them, ask someone to add them for you. WHAT ELSE CAN I DO TO HELP PREVENT FALLS?  Wear shoes that:  Do not have high heels.  Have rubber bottoms.  Are comfortable and fit you well.  Are closed at the toe. Do not wear sandals.  If you use a stepladder:  Make sure that it is fully opened. Do not climb a closed stepladder.  Make sure that both sides of the stepladder are locked into place.  Ask someone to hold it for you, if possible.  Clearly mark and make sure that you can see:  Any grab bars or handrails.  First and last steps.  Where the edge of each step is.  Use  tools that help you move around (mobility aids) if they are needed. These include:  Canes.  Walkers.  Scooters.  Crutches.  Turn on the lights when you go into a dark area. Replace any light bulbs as soon as they burn out.  Set up your furniture so you have a clear path. Avoid moving your furniture around.  If any of your floors are uneven, fix them.  If there are any pets around you, be aware of where they are.  Review your medicines with your doctor. Some medicines can make you feel dizzy. This can increase your chance of falling. Ask your doctor what other things that you can do to help prevent falls.   This information is not intended to replace advice given to you by your health care provider. Make sure you discuss any questions you have with your health care provider.   Document Released: 02/09/2009 Document Revised: 08/30/2014 Document Reviewed: 05/20/2014 Elsevier Interactive Patient Education [employer].

## 2015-05-16 NOTE — Progress Notes (Signed)
Subjective:    Patient ID: Angela Chase, female    DOB: 05/09/1945, 70 y.o.   MRN: [id number]  HPI Preventive Screening-Counseling & Management   Patient present here today for a Medicare annual wellness visit. 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 Never smoked or any alcohol use    Risk Factors  Current exercise habits:  Had been on a regular basis but has been sick the past couple months and had to stop. Will get back into her routine   Dietary issues discussed: heart healthy diet, lots of fruits and vegetables and limits fried foods and red meat. Eat more baked and grilled foods    Cardiac risk factors: mother had MI   Depression Screen  (Note: if answer to either of the following is "Yes", a more complete depression screening is indicated)   Over the past two weeks, have you felt down, depressed or hopeless? Sometimes due to her sickness  Over the past two weeks, have you felt little interest or pleasure in doing things? No  Have you lost interest or pleasure in daily life? No  Do you often feel hopeless? No  Do you cry easily over simple problems? No   Activities of Daily Living  In your present state of health, do you have any difficulty performing the following activities?  Driving?: No Managing money?: No Feeding yourself?:No Getting from bed to chair?:No Climbing a flight of stairs?: yes due to her knees Preparing food and eating?:No Bathing or showering?:No Getting  dressed?:No Getting to the toilet?:No Using the toilet?:No Moving around from place to place?: yes at times  Fall Risk Assessment In the past year have you fallen or had a near fall?: 3 falls in the past 12 months due to legs giving away  Are you currently taking any medications that make you dizzy?:No   Hearing Difficulties: No Do you often ask people to speak up or repeat themselves?:No Do you experience ringing or noises in your ears?:No Do you have difficulty understanding soft or whispered voices?:hears pretty well but would like it checked , decreased hearing at times Cognitive Testing  Alert? Yes Normal Appearance?Yes  Oriented to person? Yes Place? Yes  Time? Yes  Displays appropriate judgment?Yes  Can read the correct time from a watch face? yes Are you having problems remembering things?No  Advanced Directives have been discussed with the patient?Yes, full code brochure given needs to identify HPOA and also to document her wishes    List the Names of Other Physician/Practitioners you currently use:  Dr Aline Brochure (ortho)  Dr Domenic Polite (cardo) Dr Gwenette Greet (pulmonary)   Indicate any recent Medical Services you may have received from other than [facility] providers in the past year (date may be approximate).   Assessment:    Annual Wellness Exam   Plan:    .  Medicare Attestation  I have personally reviewed:  The patient's medical and social history  Their use of alcohol, tobacco or illicit drugs  Their current medications and supplements  The patient's functional ability including ADLs,fall risks, home safety risks, cognitive, and hearing and visual impairment  Diet and physical activities  Evidence for depression or mood disorders  The patient's weight, height, BMI, and visual acuity have been recorded in the chart. I have made referrals, counseling, and provided education to the patient based on review of the above and I have provided the patient with a written  personalized care plan for preventive services.      Review of Systems     Objective:   Physical Exam  BP 130/84 mmHg  Pulse 96  Resp 18  Ht 5' 5.5" (1.664 m)  Wt 273 lb (123.832 kg)  BMI 44.72 kg/m2  SpO2 96%  Chest: adequate air entry, bilateral wheezes, no crackles  CVS: S1 and S2 heard, no murmur, no S3 , no JVD, no pedal edema     Assessment & Plan:

## 2015-05-16 NOTE — Telephone Encounter (Signed)
Mrs. Angela Chase is calling back asking if she can get a prescription for Hydrocodone for pain, please advise?

## 2015-05-17 ENCOUNTER — Encounter (INDEPENDENT_AMBULATORY_CARE_PROVIDER_SITE_OTHER): Payer: Self-pay | Admitting: *Deleted

## 2015-05-17 MED ORDER — FLUTICASONE-SALMETEROL 100-50 MCG/DOSE IN AEPB: 1.0000 | INHALATION_SPRAY | Freq: Two times a day (BID) | RESPIRATORY_TRACT | Status: DC

## 2015-05-17 NOTE — Telephone Encounter (Signed)
Will need to have pain contract discussion and may get hydrocodone 5/325  Arrange a "good  Time" for me to have the discussion of pain contract and  Realistic dosing please, will add to visit she ahd this week, so preferably tomorrow some time before 3 pm  Also let her know of change from symbicort to advair , why, and the NEED to use daily  For better asthma control  Script for clawed cane written for her to collect / send in to [facility]

## 2015-05-17 NOTE — Telephone Encounter (Signed)
States she has terrible pain in her knees and back and wants to know if you would prescribe her hydrocodone for that. Rates her pain at a 10. Please advise

## 2015-05-17 NOTE — Telephone Encounter (Signed)
Also symbicort needs to be changed to [employer]

## 2015-05-17 NOTE — Telephone Encounter (Signed)
Pt aware- will come back to office tomorrow before 3 to discuss pain contract

## 2015-05-18 ENCOUNTER — Other Ambulatory Visit: Payer: Self-pay | Admitting: Family Medicine

## 2015-05-18 ENCOUNTER — Other Ambulatory Visit: Payer: Self-pay

## 2015-05-18 DIAGNOSIS — E785 Hyperlipidemia, unspecified: Secondary | ICD-10-CM

## 2015-05-18 DIAGNOSIS — Z1159 Encounter for screening for other viral diseases: Secondary | ICD-10-CM

## 2015-05-18 DIAGNOSIS — E559 Vitamin D deficiency, unspecified: Secondary | ICD-10-CM

## 2015-05-18 MED ORDER — HYDROCODONE-ACETAMINOPHEN 5-325 MG PO TABS: ORAL_TABLET | ORAL | Status: DC

## 2015-05-23 LAB — VITAMIN D 25 HYDROXY (VIT D DEFICIENCY, FRACTURES): Vit D, 25-Hydroxy: 30 ng/mL (ref 30–100)

## 2015-05-23 LAB — LIPID PANEL
Cholesterol: 219 mg/dL — ABNORMAL HIGH (ref 125–200)
HDL: 83 mg/dL (ref >=46)
LDL CALC: 123 mg/dL (ref <130)
Total CHOL/HDL Ratio: 2.6 Ratio (ref <=5.0)
Triglycerides: 66 mg/dL (ref <150)
VLDL: 13 mg/dL (ref <30)

## 2015-05-23 LAB — HEPATITIS C ANTIBODY: HCV AB: NEGATIVE

## 2015-06-04 ENCOUNTER — Encounter: Payer: Self-pay | Admitting: Family Medicine

## 2015-06-04 DIAGNOSIS — Z0000 Encounter for general adult medical examination without abnormal findings: Secondary | ICD-10-CM | POA: Insufficient documentation

## 2015-06-04 DIAGNOSIS — R600 Localized edema: Secondary | ICD-10-CM | POA: Insufficient documentation

## 2015-06-04 DIAGNOSIS — Z23 Encounter for immunization: Secondary | ICD-10-CM | POA: Insufficient documentation

## 2015-06-04 NOTE — Assessment & Plan Note (Signed)
High fall risk, home safety reviewed in detail, script for cane provided, shhe is also encouraged to work on weight loss and seek definitive management of her knee pathology

## 2015-06-04 NOTE — Assessment & Plan Note (Signed)

## 2015-06-04 NOTE — Assessment & Plan Note (Signed)
Not suicidal or homicidal, but increasingly withdrawn with excessive weight gain, needs to resume antidepressant which she discontinued independently

## 2015-06-04 NOTE — Assessment & Plan Note (Signed)
Recurrent problem, and patient has not followed through on the echo cardiogram scheduled in the past by cardiology, states she is now ready for evaluation as she reports an episoiode of chest pain radiating to left arm this past weekend and has chronic fatigue  Which is multifactorial

## 2015-06-04 NOTE — Assessment & Plan Note (Signed)
After obtaining informed consent, the vaccine is  administered by LPN.  

## 2015-06-06 ENCOUNTER — Telehealth: Payer: Self-pay | Admitting: Family Medicine

## 2015-06-06 NOTE — Telephone Encounter (Signed)
Called pt, no answer, mailbox full.

## 2015-06-06 NOTE — Telephone Encounter (Signed)
Both legs and feet have been very swollen for the past 3 days and they are leaking fluid and her socks are wet. Had some 40mg  lasix at home and took 1 yesterday and today and they are still swollen. Wants to know what you recommend. Please advise . Has been keeping them elevated and hasn't been eating much salt

## 2015-06-06 NOTE — Telephone Encounter (Signed)
Patient states that her legs are very swollen, took a fluid pill but not helping, fluid is leaking through her socks through her pores, please advise?

## 2015-06-06 NOTE — Telephone Encounter (Signed)
Called pt for more info but no answer and unable to leave voicemail.

## 2015-06-06 NOTE — Telephone Encounter (Signed)
Can she come by for me to see before sending in higher doses of lasix for short time, will see today , just nuirse visit, and get weight annd blood pressure , I will loook at her legs though

## 2015-06-07 ENCOUNTER — Ambulatory Visit: Payer: PPO

## 2015-06-07 ENCOUNTER — Other Ambulatory Visit: Payer: Self-pay | Admitting: Family Medicine

## 2015-06-07 VITALS — BP 124/74 | Wt 277.0 lb

## 2015-06-07 DIAGNOSIS — R600 Localized edema: Secondary | ICD-10-CM

## 2015-06-07 MED ORDER — POTASSIUM CHLORIDE CRYS ER 20 MEQ PO TBCR: EXTENDED_RELEASE_TABLET | ORAL | Status: DC

## 2015-06-07 MED ORDER — FUROSEMIDE 40 MG PO TABS: ORAL_TABLET | ORAL | Status: DC

## 2015-06-07 NOTE — Telephone Encounter (Signed)
Patient to come in this am for nurse visit to have dr check her legs and increase her lasix

## 2015-06-07 NOTE — Patient Instructions (Signed)
Follow up the end of Feb and cancel march appt   Take 2 lasix's when you get home today.   Take lasix 40mg  (one twice daily for 1 week) then one tablet daily as needed  Take Potassium three times daily for one week.  Lab work before your next visit

## 2015-06-16 ENCOUNTER — Ambulatory Visit (INDEPENDENT_AMBULATORY_CARE_PROVIDER_SITE_OTHER): Payer: PPO | Admitting: Cardiology

## 2015-06-16 ENCOUNTER — Encounter: Payer: Self-pay | Admitting: Cardiology

## 2015-06-16 VITALS — BP 138/72 | HR 99 | Ht 64.0 in | Wt 272.0 lb

## 2015-06-16 DIAGNOSIS — R0602 Shortness of breath: Secondary | ICD-10-CM | POA: Diagnosis not present

## 2015-06-16 DIAGNOSIS — R600 Localized edema: Secondary | ICD-10-CM | POA: Diagnosis not present

## 2015-06-16 DIAGNOSIS — E785 Hyperlipidemia, unspecified: Secondary | ICD-10-CM

## 2015-06-16 NOTE — Progress Notes (Signed)
Cardiology Office Note  Date: 06/16/2015   ID: Angela Chase, Angela Chase 1945-09-03, MRN [id number]  PCP: Angela Nakayama, MD  Primary Cardiologist: Angela Lesches, MD   Chief Complaint  Patient presents with  . Follow-up leg edema  . History of chest pain    History of Present Illness: Angela Chase is a 70 y.o. female referred back to the office again by Dr. Moshe Chase for follow-up of leg edema and history of chest pain. I last saw [name] in November 2016. At that time I recommended more regular use of her Lasix, cutting back fluid intake, and also an echocardiogram to assess cardiac structure and function. She once again elected not to pursue the echocardiogram.  She states that she has been doubling up on her Lasix and potassium supplements over the last week per instructions from Dr. Moshe Chase and is starting to have improvement in her leg edema, less weeping. She still looks like she has additional volume to go however. She does not endorse any recurring chest pain at this time. She does tell me that she has had recurrent upper respiratory tract infections, and was on a 21 day steroid taper around the holidays which he thinks may have contributed somewhat to her additional fluid. Her weight is down 5 pounds over the last week.  Prior ischemic workup was in 2013 by Cardiolite study which was low risk as outlined below.  I reviewed her medications. He is currently taking Lasix at 80 mg daily potassium supplements. I reviewed her recent lab work.  Past Medical History  Diagnosis Date  . Mixed hyperlipidemia   . Osteoarthritis   . GERD (gastroesophageal reflux disease) 1995  . Asthma 2010  . Anxiety   . Depression   . Hoarseness   . Obstructive sleep apnea     On CPAP  . Wears glasses     Past Surgical History  Procedure Laterality Date  . Rotator cuff repair Right 2005  . Cholecystectomy  1985    Current Outpatient Prescriptions  Medication Sig Dispense  Refill  . acetaminophen (TYLENOL) 500 MG tablet Take 500 mg by mouth every 6 (six) hours as needed.    Marland Kitchen albuterol (PROVENTIL HFA;VENTOLIN HFA) 108 (90 BASE) MCG/ACT inhaler Inhale 2 puffs into the lungs every 6 (six) hours as needed for wheezing or shortness of breath. 18 g 0  . albuterol (PROVENTIL) (2.5 MG/3ML) 0.083% nebulizer solution Take 3 mLs (2.5 mg total) by nebulization every 6 (six) hours as needed for wheezing or shortness of breath. 150 mL 1  . aspirin 81 MG tablet Take 81 mg by mouth daily.    . diclofenac sodium (VOLTAREN) 1 % GEL Apply twice daily to each knee 100 g 2  . FLUoxetine (PROZAC) 20 MG capsule Take 1 capsule (20 mg total) by mouth daily. 30 capsule 5  . Fluticasone-Salmeterol (ADVAIR) 100-50 MCG/DOSE AEPB Inhale 1 puff into the lungs 2 (two) times daily. 1 each 5  . furosemide (LASIX) 40 MG tablet Take 40 mg by mouth daily.    Marland Kitchen HYDROcodone-acetaminophen (NORCO/VICODIN) 5-325 MG tablet One tablet  At bedtime as needed, for uncontrolled pain Thirty tablets to last 4 month 30 tablet 0  . ipratropium (ATROVENT) 0.02 % nebulizer solution Take 2.5 mLs (0.5 mg total) by nebulization every 6 (six) hours as needed for wheezing or shortness of breath. Use with albuterol solution 75 mL 12  . mometasone (NASONEX) 50 MCG/ACT nasal spray Place 2 sprays into the nose daily.  17 g 5  . montelukast (SINGULAIR) 10 MG tablet Take 1 tablet (10 mg total) by mouth at bedtime. 30 tablet 5  . potassium chloride SA (K-DUR,KLOR-CON) 20 MEQ tablet Take 1 tablet (20 mEq total) by mouth 2 (two) times daily. 60 tablet 1  . potassium chloride SA (K-DUR,KLOR-CON) 20 MEQ tablet One tablet three times daily for 1 week, start 06/07/2015 21 tablet 0  . Spacer/Aero-Holding Dorise Bullion Use with inhaler 1 each 1  . Vitamin D, Ergocalciferol, (DRISDOL) 50000 units CAPS capsule TAKE 1 CAPSULE BY MOUTH EVERY 7 DAYS 4 capsule 5   No current facility-administered medications for this visit.   Allergies:   Cephalexin; Omeprazole; and Rofecoxib   Social History: The patient  reports that she has never smoked. She has never used smokeless tobacco. She reports that she does not drink alcohol or use illicit drugs.   ROS:  Please see the history of present illness. Otherwise, complete review of systems is positive for recent upper restrictive tract infections.  All other systems are reviewed and negative.   Physical Exam: VS:  BP 138/72 mmHg  Pulse 99  Ht 5\' 4"  (1.626 m)  Wt 272 lb (123.378 kg)  BMI 46.67 kg/m2  SpO2 97%, BMI Body mass index is 46.67 kg/(m^2).  Wt Readings from Last 3 Encounters:  06/16/15 272 lb (123.378 kg)  06/07/15 277 lb (125.646 kg)  05/16/15 273 lb (123.832 kg)    Obese woman, appears comfortable at rest.  HEENT: Conjunctiva and lids normal, oropharynx clear.  Neck: Supple, no elevated JVP or carotid bruits, no thyromegaly.  Lungs: Decreased breath sounds but no wheezes, nonlabored breathing at rest.  Cardiac: Regular rate and rhythm, no S3 or significant systolic murmur, no pericardial rub.  Abdomen: Soft, nontender, bowel sounds present, no guarding or rebound.  Extremities: Chronic appearing leg edema and possible lymphedema, distal pulses 1-2+.   ECG: I personally reviewed the prior tracing from 03/16/2015 which showed sinus rhythm with low voltage and decreased R wave progression.  Recent Labwork: 07/26/2014: ALT 33; AST 31; BUN 10; Creat 0.58; Hemoglobin 13.1; Platelets 236; Potassium 4.0; Sodium 143; TSH 2.765     Component Value Date/Time   CHOL 219* 05/22/2015 1405   TRIG 66 05/22/2015 1405   HDL 83 05/22/2015 1405   CHOLHDL 2.6 05/22/2015 1405   VLDL 13 05/22/2015 1405   LDLCALC 123 05/22/2015 1405    Other Studies Reviewed Today:  Lexiscan Cardiolite 10/09/2011: Radionuclide Data: One-day rest/stress protocol performed with 10/30 mCi of Tc-73m Myoview.  Stress Data: Regadenoson infusion resulted in headache and abdominal discomfort.  There was a moderate increase in heart rate and minimal decrease in systolic blood pressure with drug administration. No arrhythmias noted.  EKG: Normal sinus rhythm; borderline left atrial abnormality; delayed R-wave progression; borderline low voltage. No significant change with pharmacologic stress.  Scintigraphic Data: Acquisition notable for moderate diaphragmatic and mild to moderate breast attenuation. Moderately intense GI activity was adjacent to the cardiac apex. Left ventricular size was normal. On tomographic images reconstructed in standard planes, there was uniform and normal uptake of tracer in all myocardial segments. The rest images were unchanged. The gated reconstruction demonstrated normal regional and global LV systolic function as well as normal systolic accentuation of activity throughout. Estimated ejection fraction was 61%.  IMPRESSION: Negative pharmacologic stress nuclear myocardial study revealing no evidence for myocardial ischemia or infarction and normal left ventricular systolic function. Other findings as noted.  Assessment and Plan:  1. Chronic recurring  leg edema. Possibly exacerbated by 21 day course of steroids around the holidays. She has also been inconsistent with Lasix use however. Lasix dose now 80 mg daily and she has been doing this over the last week with gradual improvement. I recommended that she continue this for the next week and then go back to the Lasix 40 mg dose on a daily basis with prior potassium supplementation. I once again recommended an echocardiogram to better evaluate cardiac structure and function. She states that she will follow through on this.  2. History of chest pain with prior negative ischemic workup. No progressing symptoms to suggest angina.  3. History of hyperlipidemia, recent LDL 123.  Current medicines were reviewed with the patient today.   Orders Placed This Encounter  Procedures  .  Echocardiogram    Disposition: Call with results.  Signed, Satira Sark, MD, [facility] 06/16/2015 3:29 PM    [place] [facility] [address], [place], [place] [address] Phone: [phone]; Fax: [phone]

## 2015-06-16 NOTE — Patient Instructions (Signed)
Your physician recommends that you schedule a follow-up appointment in:  To be determined    Your physician recommends that you continue on your current medications as directed. Please refer to the Current Medication list given to you today.     Your physician has requested that you have an echocardiogram. Echocardiography is a painless test that uses sound waves to create images of your heart. It provides your doctor with information about the size and shape of your heart and how well your heart's chambers and valves are working. This procedure takes approximately one hour. There are no restrictions for this procedure.      Thank you for choosing [facility] !         

## 2015-06-22 ENCOUNTER — Ambulatory Visit (HOSPITAL_COMMUNITY): Admission: RE | Admit: 2015-06-22 | Payer: PPO | Source: Ambulatory Visit

## 2015-06-28 ENCOUNTER — Ambulatory Visit: Payer: PPO | Admitting: Family Medicine

## 2015-07-04 ENCOUNTER — Ambulatory Visit: Payer: PPO | Admitting: Pulmonary Disease

## 2015-07-14 ENCOUNTER — Ambulatory Visit: Payer: PPO | Admitting: Family Medicine

## 2015-07-14 ENCOUNTER — Encounter: Payer: Self-pay | Admitting: Family Medicine

## 2015-08-04 ENCOUNTER — Other Ambulatory Visit: Payer: Self-pay | Admitting: Family Medicine

## 2015-08-07 ENCOUNTER — Other Ambulatory Visit: Payer: Self-pay

## 2015-08-07 MED ORDER — VITAMIN D (ERGOCALCIFEROL) 1.25 MG (50000 UNIT) PO CAPS: ORAL_CAPSULE | ORAL | Status: DC

## 2015-08-30 ENCOUNTER — Ambulatory Visit (INDEPENDENT_AMBULATORY_CARE_PROVIDER_SITE_OTHER): Payer: PPO | Admitting: Family Medicine

## 2015-08-30 ENCOUNTER — Encounter: Payer: Self-pay | Admitting: Family Medicine

## 2015-08-30 VITALS — BP 124/74 | HR 94 | Resp 16 | Ht 64.0 in | Wt 274.0 lb

## 2015-08-30 DIAGNOSIS — M25561 Pain in right knee: Secondary | ICD-10-CM

## 2015-08-30 DIAGNOSIS — E785 Hyperlipidemia, unspecified: Secondary | ICD-10-CM

## 2015-08-30 DIAGNOSIS — J302 Other seasonal allergic rhinitis: Secondary | ICD-10-CM

## 2015-08-30 DIAGNOSIS — G4733 Obstructive sleep apnea (adult) (pediatric): Secondary | ICD-10-CM

## 2015-08-30 DIAGNOSIS — F329 Major depressive disorder, single episode, unspecified: Secondary | ICD-10-CM

## 2015-08-30 DIAGNOSIS — M25562 Pain in left knee: Secondary | ICD-10-CM

## 2015-08-30 DIAGNOSIS — J4520 Mild intermittent asthma, uncomplicated: Secondary | ICD-10-CM | POA: Diagnosis not present

## 2015-08-30 DIAGNOSIS — R600 Localized edema: Secondary | ICD-10-CM

## 2015-08-30 DIAGNOSIS — R7302 Impaired glucose tolerance (oral): Secondary | ICD-10-CM

## 2015-08-30 DIAGNOSIS — F32A Depression, unspecified: Secondary | ICD-10-CM

## 2015-08-30 DIAGNOSIS — H547 Unspecified visual loss: Secondary | ICD-10-CM

## 2015-08-30 DIAGNOSIS — Z6841 Body Mass Index (BMI) 40.0 and over, adult: Secondary | ICD-10-CM

## 2015-08-30 LAB — LIPID PANEL
CHOLESTEROL: 180 mg/dL (ref 125–200)
HDL: 60 mg/dL (ref >=46)
LDL Cholesterol: 96 mg/dL (ref <130)
TRIGLYCERIDES: 121 mg/dL (ref <150)
Total CHOL/HDL Ratio: 3 Ratio (ref <=5.0)
VLDL: 24 mg/dL (ref <30)

## 2015-08-30 LAB — COMPLETE METABOLIC PANEL WITH GFR
ALBUMIN: 3.8 g/dL (ref 3.6–5.1)
ALK PHOS: 79 U/L (ref 33–130)
ALT: 14 U/L (ref 6–29)
AST: 23 U/L (ref 10–35)
BILIRUBIN TOTAL: 1.1 mg/dL (ref 0.2–1.2)
BUN: 10 mg/dL (ref 7–25)
CO2: 26 mmol/L (ref 20–31)
Calcium: 9.5 mg/dL (ref 8.6–10.4)
Chloride: 103 mmol/L (ref 98–110)
Creat: 0.65 mg/dL (ref 0.50–0.99)
Glucose, Bld: 84 mg/dL (ref 65–99)
Potassium: 3.9 mmol/L (ref 3.5–5.3)
Sodium: 141 mmol/L (ref 135–146)
TOTAL PROTEIN: 7.8 g/dL (ref 6.1–8.1)

## 2015-08-30 LAB — TSH: TSH: 4 m[IU]/L

## 2015-08-30 LAB — CBC
HCT: 37.9 % (ref 35.0–45.0)
HEMOGLOBIN: 12.3 g/dL (ref 11.7–15.5)
MCH: 25.6 pg — AB (ref 27.0–33.0)
MCHC: 32.5 g/dL (ref 32.0–36.0)
MCV: 79 fL — AB (ref 80.0–100.0)
MPV: 9.5 fL (ref 7.5–12.5)
Platelets: 260 10*3/uL (ref 140–400)
RBC: 4.8 MIL/uL (ref 3.80–5.10)
RDW: 14.6 % (ref 11.0–15.0)
WBC: 5.6 10*3/uL (ref 3.8–10.8)

## 2015-08-30 LAB — HEMOGLOBIN A1C
HEMOGLOBIN A1C: 5.5 % (ref <5.7)
MEAN PLASMA GLUCOSE: 111 mg/dL

## 2015-08-30 MED ORDER — PREDNISONE 5 MG PO TABS
5.0000 mg | ORAL_TABLET | Freq: Two times a day (BID) | ORAL | Status: AC
Start: 2015-08-30 — End: 2015-09-03

## 2015-08-30 MED ORDER — KETOROLAC TROMETHAMINE 60 MG/2ML IM SOLN
60.0000 mg | Freq: Once | INTRAMUSCULAR | Status: AC
  Administered 2015-08-30: 60 mg via INTRAMUSCULAR

## 2015-08-30 MED ORDER — METHYLPREDNISOLONE ACETATE 80 MG/ML IJ SUSP
80.0000 mg | Freq: Once | INTRAMUSCULAR | Status: AC
Start: 2015-08-30 — End: 2015-08-30
  Administered 2015-08-30: 80 mg via INTRAMUSCULAR

## 2015-08-30 MED ORDER — POTASSIUM CHLORIDE CRYS ER 20 MEQ PO TBCR: 20.0000 meq | EXTENDED_RELEASE_TABLET | Freq: Three times a day (TID) | ORAL | Status: DC

## 2015-08-30 MED ORDER — FUROSEMIDE 40 MG PO TABS: 40.0000 mg | ORAL_TABLET | Freq: Two times a day (BID) | ORAL | Status: DC

## 2015-08-30 MED ORDER — AZELASTINE HCL 0.1 % NA SOLN: 2.0000 | Freq: Two times a day (BID) | NASAL | Status: DC

## 2015-08-30 MED ORDER — MONTELUKAST SODIUM 10 MG PO TABS: 10.0000 mg | ORAL_TABLET | Freq: Every day | ORAL | Status: DC

## 2015-08-30 NOTE — Patient Instructions (Addendum)
Annual exam in mid to end June, call if you need me before  CBC, lipid, CMP and EGFR, TSH, HBa1C today  Increase lasix to one twice daily,, and potassium one three times daily  Need sleep apnea treated  Singulair one daily allergies, and astellin nose spray  You are referred to Dr Maceo Pro for eye exam  Injection in office today for uncontrolled allergies and knee pain, and 5 days of prednisone sent in, also singulair and astellin  BRING ALL mEDS tO NEXT VISIT pLS

## 2015-08-30 NOTE — Assessment & Plan Note (Signed)
Uncontrolled.Toradol and depo medrol administered IM in the office , to be followed by a short course of oral prednisone and NSAIDS.  

## 2015-08-30 NOTE — Progress Notes (Signed)
   Subjective:    Patient ID: Angela Chase, female    DOB: 02-21-1946, 70 y.o.   MRN: [id number]  HPI    Angela Chase     MRN: [id number]      DOB: [birth year]   HPI Angela Chase is here for follow up and re-evaluation of chronic medical conditions, medication management and review of any available recent lab and radiology data.  Preventive health is updated, specifically  Cancer screening and Immunization.   . The PT denies any adverse reactions to current medications since the last visit. Takes medications inconsistently and uncertain as to exactly what she does have and is taking, once when checked pharmacy, most of her meds are not collected Main concerns are of significant leg swelling and uncontrolled allergy symptoms , again c/o not feeling well for a long time  ROS Denies recent fever or chills.chronic fatigue  Denies chest congestion, productive cough or wheezing. Denies chest pains, palpitations Denies abdominal pain, nausea, vomiting,diarrhea or constipation.   Denies dysuria, frequency, hesitancy or incontinence. Denies headaches, seizures, numbness, or tingling. Denies depression, anxiety or insomnia. Denies skin break down or rash.   PE  BP 124/74 mmHg  Pulse 94  Resp 16  Ht 5\' 4"  (1.626 m)  Wt 274 lb (124.286 kg)  BMI 47.01 kg/m2  SpO2 95%  Patient alert and oriented and in no cardiopulmonary distress.  HEENT:erythema and edema of nasal mucosa with excess clear drainage Chest: Clear to auscultation bilaterally.  CVS: S1, S2 no murmurs, no S3.Regular rate.  ABD: Soft non tender.   Ext: No edema  MS: decreased  ROM spine, shoulders, hips and knees.deformity of knees  Skin: Intact, no ulcerations or rash noted.  Psych: Good eye contact, normal affect. Memory intact not anxious or depressed appearing.  CNS: CN 2-12 intact, power,  normal throughout.no focal deficits noted.   Assessment & Plan   Bilateral knee  pain Uncontrolled.Toradol and depo medrol administered IM in the office , to be followed by a short course of oral prednisone and NSAIDS.   Bilateral leg edema Uncontrolled, 2 plus pitting edema, double dose of diuretic, low sodium diet , compression hose and leg elevation  Morbid obesity with BMI of 40.0-44.9, adult Deteriorated. Patient re-educated about  the importance of commitment to a  minimum of 150 minutes of exercise per week.  The importance of healthy food choices with portion control discussed. Encouraged to start a food diary, count calories and to consider  joining a support group. Sample diet sheets offered. Goals set by the patient for the next several months.   Weight /BMI 08/30/2015 06/16/2015 06/07/2015  WEIGHT 274 lb 272 lb 277 lb  HEIGHT 5\' 4"  5\' 4"  -  BMI 47.01 kg/m2 46.67 kg/m2 45.38 kg/m2    Current exercise per week 30 minutes.   Unspecified vitamin D deficiency Needs weekly supplement  Depression Inconsistent use of SSRI, re educated re the need to take med consistently  Seasonal allergies Uncontrolled additional medication started, again , pt's compliance is questionable , despite the fact that she reports feeling badly for prolonged periods when she does come in to the office  Intrinsic asthma Stable no recent flare  OSA (obstructive sleep apnea) Still lacks supplies, needs to get back to treating MD     Review of Systems     Objective:   Physical Exam        Assessment & Plan:

## 2015-09-10 ENCOUNTER — Telehealth: Payer: Self-pay | Admitting: Family Medicine

## 2015-09-10 NOTE — Assessment & Plan Note (Signed)
Inconsistent use of SSRI, re educated re the need to take med consistently

## 2015-09-10 NOTE — Assessment & Plan Note (Signed)
Uncontrolled, 2 plus pitting edema, double dose of diuretic, low sodium diet , compression hose and leg elevation

## 2015-09-10 NOTE — Assessment & Plan Note (Signed)
Still lacks supplies, needs to get back to treating MD

## 2015-09-10 NOTE — Assessment & Plan Note (Signed)
Deteriorated. Patient re-educated about  the importance of commitment to a  minimum of 150 minutes of exercise per week.  The importance of healthy food choices with portion control discussed. Encouraged to start a food diary, count calories and to consider  joining a support group. Sample diet sheets offered. Goals set by the patient for the next several months.   Weight /BMI 08/30/2015 06/16/2015 06/07/2015  WEIGHT 274 lb 272 lb 277 lb  HEIGHT 5\' 4"  5\' 4"  -  BMI 47.01 kg/m2 46.67 kg/m2 45.38 kg/m2    Current exercise per week 30 minutes.

## 2015-09-10 NOTE — Assessment & Plan Note (Signed)
Uncontrolled additional medication started, again , pt's compliance is questionable , despite the fact that she reports feeling badly for prolonged periods when she does come in to the office

## 2015-09-10 NOTE — Telephone Encounter (Signed)
Pls call pt BEFORE referring to Dr Carlisle Beers am uncertain as to whether she does need to go, her vision screen in January wasgood. IF she feels she needs eye exam then ok referal if not pls cancel. ?? pls ask. If unable to contact pt by telephone, after 2 to 3 attempts pls cancel I will address at her f/u

## 2015-09-10 NOTE — Assessment & Plan Note (Signed)
Stable no recent flare 

## 2015-09-10 NOTE — Assessment & Plan Note (Signed)
Needs weekly supplement 

## 2015-09-11 NOTE — Telephone Encounter (Signed)
Patient agreed to go Tues May 30th at 2:30

## 2015-10-03 ENCOUNTER — Ambulatory Visit (HOSPITAL_COMMUNITY)
Admission: RE | Admit: 2015-10-03 | Discharge: 2015-10-03 | Disposition: A | Discharge status: Home / Self Care | Payer: PPO | Source: Ambulatory Visit | Attending: Cardiology | Admitting: Cardiology

## 2015-10-03 DIAGNOSIS — G4733 Obstructive sleep apnea (adult) (pediatric): Secondary | ICD-10-CM | POA: Diagnosis not present

## 2015-10-03 DIAGNOSIS — R0602 Shortness of breath: Secondary | ICD-10-CM | POA: Diagnosis not present

## 2015-10-03 DIAGNOSIS — Z6841 Body Mass Index (BMI) 40.0 and over, adult: Secondary | ICD-10-CM | POA: Diagnosis not present

## 2015-10-03 DIAGNOSIS — E785 Hyperlipidemia, unspecified: Secondary | ICD-10-CM | POA: Insufficient documentation

## 2015-10-03 DIAGNOSIS — I517 Cardiomegaly: Secondary | ICD-10-CM | POA: Insufficient documentation

## 2015-10-03 LAB — ECHOCARDIOGRAM COMPLETE
CHL CUP MV DEC (S): 219
CHL CUP RV SYS PRESS: 26 mmHg
CHL CUP TV REG PEAK VELOCITY: 240 cm/s
E decel time: 219 msec
EERAT: 10.45
FS: 44 % (ref 28–44)
IVS/LV PW RATIO, ED: 0.96
LA diam index: 1.64 cm/m2
LA vol A4C: 66.2 ml
LA vol index: 25.8 mL/m2
LASIZE: 40 mm
LAVOL: 63.1 mL
LEFT ATRIUM END SYS DIAM: 40 mm
LV E/e'average: 10.45
LV TDI E'MEDIAL: 7.72
LV dias vol index: 21 mL/m2
LV e' LATERAL: 8.59 cm/s
LV sys vol index: 7 mL/m2
LV sys vol: 17 mL (ref 14–42)
LVDIAVOL: 52 mL (ref 46–106)
LVEEMED: 10.45
LVOT area: 3.14 cm2
LVOT diameter: 20 mm
Lateral S' vel: 12 cm/s
MV Peak grad: 3 mmHg
MV pk A vel: 62.5 m/s
MVPKEVEL: 89.8 m/s
PW: 9.87 mm — AB (ref 0.6–1.1)
Simpson's disk: 67
Stroke v: 34 ml
TAPSE: 25.2 mm
TDI e' lateral: 8.59
TRMAXVEL: 240 cm/s

## 2015-10-04 ENCOUNTER — Encounter: Payer: Self-pay | Admitting: Pulmonary Disease

## 2015-10-04 ENCOUNTER — Ambulatory Visit (INDEPENDENT_AMBULATORY_CARE_PROVIDER_SITE_OTHER): Payer: PPO | Admitting: Pulmonary Disease

## 2015-10-04 VITALS — BP 124/68 | HR 92 | Ht 65.5 in | Wt 262.2 lb

## 2015-10-04 DIAGNOSIS — J45909 Unspecified asthma, uncomplicated: Secondary | ICD-10-CM | POA: Diagnosis not present

## 2015-10-04 DIAGNOSIS — G4733 Obstructive sleep apnea (adult) (pediatric): Secondary | ICD-10-CM | POA: Diagnosis not present

## 2015-10-04 DIAGNOSIS — R058 Other specified cough: Secondary | ICD-10-CM

## 2015-10-04 DIAGNOSIS — R05 Cough: Secondary | ICD-10-CM

## 2015-10-04 NOTE — Assessment & Plan Note (Signed)
Non-Compliance with CPAP Plan: We will place a request to have you CPAP machine checked. We will set you up for a home sleep study. You need to wear the device every night without fail when repaired. Goal is to wear for at least 7-8  hours each night for maximal clinical benefit. Continue to work on weight loss, as the link between excess weight  and sleep apnea is well established.  Do not drive if sleepy. Follow up with Dr. Halford Chessman In 1 year or before as needed.  Remember to use your Advair every day as this is your maintenance medication and needs to be used daily. Shower every night before going to bed if you have been outside in the pollen so it does not transfer to your bed linens.

## 2015-10-04 NOTE — Patient Instructions (Addendum)
Will arrange for home sleep study  Can try flonase or nasacort for sinus allergies  Will call to schedule follow up after sleep study done

## 2015-10-04 NOTE — Progress Notes (Signed)
History of Present Illness Angela Chase is a 70 y.o. female with OSA on CPAP and asthma, former patient of Dr. Gwenette Greet, here to establish with Dr. Halford Chessman.   6/7/2017One Year Follow Up CPAP usage: Pt. Presents to the office for follow up of CPAP therapy. She is not wearing her CPAP machine  because it is not recording the usage.She said it has been about 1 year since she used it.We reviewed that she needs to be wearing it every night to get the benefits of therapy.  She states her machine is a few years old. We will place a request with [facility] to have the machine assessed and repaired. She is not compliant with her Advair daily or her claritin daily, but states she is having allergy issues. We reviewed the need to be compliant with her maintenance medications, and her CPAP machine.  No Download    Past medical hx Past Medical History  Diagnosis Date  . Mixed hyperlipidemia   . Osteoarthritis   . GERD (gastroesophageal reflux disease) 1995  . Asthma 2010  . Anxiety   . Depression   . Hoarseness   . Obstructive sleep apnea     On CPAP  . Wears glasses      Past surgical hx, Family hx, Social hx all reviewed.  Current Outpatient Prescriptions on File Prior to Visit  Medication Sig  . acetaminophen (TYLENOL) 500 MG tablet Take 500 mg by mouth every 6 (six) hours as needed.  Marland Kitchen albuterol (PROVENTIL HFA;VENTOLIN HFA) 108 (90 BASE) MCG/ACT inhaler Inhale 2 puffs into the lungs every 6 (six) hours as needed for wheezing or shortness of breath.  Marland Kitchen albuterol (PROVENTIL) (2.5 MG/3ML) 0.083% nebulizer solution Take 3 mLs (2.5 mg total) by nebulization every 6 (six) hours as needed for wheezing or shortness of breath.  Marland Kitchen aspirin 81 MG tablet Take 81 mg by mouth daily.  Marland Kitchen azelastine (ASTELIN) 0.1 % nasal spray Place 2 sprays into both nostrils 2 (two) times daily. Use in each nostril as directed  . diclofenac sodium (VOLTAREN) 1 % GEL Apply twice daily to each knee  . furosemide (LASIX) 40  MG tablet Take 1 tablet (40 mg total) by mouth 2 (two) times daily.  Marland Kitchen HYDROcodone-acetaminophen (NORCO/VICODIN) 5-325 MG tablet One tablet  At bedtime as needed, for uncontrolled pain Thirty tablets to last 4 month  . ipratropium (ATROVENT) 0.02 % nebulizer solution Take 2.5 mLs (0.5 mg total) by nebulization every 6 (six) hours as needed for wheezing or shortness of breath. Use with albuterol solution  . mometasone (NASONEX) 50 MCG/ACT nasal spray Place 2 sprays into the nose daily.  . potassium chloride SA (K-DUR,KLOR-CON) 20 MEQ tablet Take 1 tablet (20 mEq total) by mouth 3 (three) times daily.  Marland Kitchen Spacer/Aero-Holding Dorise Bullion Use with inhaler  . Vitamin D, Ergocalciferol, (DRISDOL) 50000 units CAPS capsule TAKE 1 CAPSULE BY MOUTH EVERY 7 DAYS  . FLUoxetine (PROZAC) 20 MG capsule Take 1 capsule (20 mg total) by mouth daily. (Patient not taking: Reported on 10/04/2015)  . Fluticasone-Salmeterol (ADVAIR) 100-50 MCG/DOSE AEPB Inhale 1 puff into the lungs 2 (two) times daily. (Patient not taking: Reported on 10/04/2015)   No current facility-administered medications on file prior to visit.     Allergies  Allergen Reactions  . Cephalexin Hives  . Omeprazole Hives  . Rofecoxib Rash    Review Of Systems:  Constitutional:   No  weight loss, night sweats,  Fevers, chills, fatigue, or  lassitude.  HEENT:   No headaches,  Difficulty swallowing,  Tooth/dental problems, or  Sore throat,                No sneezing, itching, ear ache, +nasal congestion,+ post nasal drip,   CV:  No chest pain,  Orthopnea, PND, swelling in lower extremities, anasarca, dizziness, palpitations, syncope.   GI  No heartburn, indigestion, abdominal pain, nausea, vomiting, diarrhea, change in bowel habits, loss of appetite, bloody stools.   Resp: + shortness of breath with exertion not at rest.  No excess mucus, no productive cough,  + non-productive cough,  No coughing up of blood.  No change in color of mucus.  No  wheezing.  No chest wall deformity  Skin: no rash or lesions.  GU: no dysuria, change in color of urine, no urgency or frequency.  No flank pain, no hematuria   MS:  No joint pain or swelling.  No decreased range of motion.  No back pain.  Psych:  No change in mood or affect. No depression or anxiety.  No memory loss.   Vital Signs BP 124/68 mmHg  Pulse 92  Ht 5' 5.5" (1.664 m)  Wt 262 lb 3.2 oz (118.933 kg)  BMI 42.95 kg/m2  SpO2 96%   Physical Exam:  General- No distress,  A&Ox3, obese ENT: No sinus tenderness, TM clear, pale nasal mucosa, no oral exudate,no post nasal drip, no LAN Cardiac: S1, S2, regular rate and rhythm, no murmur Chest: No wheeze/ rales/ dullness; no accessory muscle use, no nasal flaring, no sternal retractions Abd.: Soft Non-tender Ext: No clubbing cyanosis, edema Neuro:  normal strength Skin: No rashes, warm and dry Psych: normal mood and behavior   Assessment/Plan  OSA (obstructive sleep apnea) Non-Compliance with CPAP Plan: We will place a request to have you CPAP machine checked. We will set you up for a home sleep study. You need to wear the device every night without fail when repaired. Goal is to wear for at least 7-8  hours each night for maximal clinical benefit. Continue to work on weight loss, as the link between excess weight  and sleep apnea is well established.  Do not drive if sleepy. Follow up with Dr. Halford Chessman In 1 year or before as needed.  Remember to use your Advair every day as this is your maintenance medication and needs to be used daily. Shower every night before going to bed if you have been outside in the pollen so it does not transfer to your bed linens.      Magdalen Spatz, NP 10/04/2015  3:40 PM  Tests PSG 07/27/13 >> AHI 46 Spirometry 09/13/14 >> FEV1 1.85 (91%), FEV1% 71 Echo 09/02/15 >> EF 60 to 65%, mild LA dilation  She has not used CPAP for almost 1 year.  She is waking up with cough and gag.  She has  cough with clear sputum.  She is not aware of wheezing, but others tell her she wheezes.  No sinus tenderness, no oral exudate, no wheeze, HR regular, no edema.  Assessment/plan:  Obstructive sleep apnea. - will arrange for home sleep study to assess current status and then determine if she needs to resume CPAP therapy  Upper airway cough syndrome with allergic rhinitis and post nasal drip. - continue astelin - discussed environmental control techniques - she can try OTC nasal steroids prn  Asthma. - continue advair, prn albuterol   Chesley Mires, MD [place] 10/04/2015, 3:47 PM Pager:  [phone]

## 2015-10-16 ENCOUNTER — Telehealth: Payer: Self-pay | Admitting: Pulmonary Disease

## 2015-10-16 NOTE — Telephone Encounter (Signed)
Spoke with pt. Advised her that I do not see where anyone from our office called her. States that there was no message left with her. Message will be closed.

## 2015-10-27 DIAGNOSIS — G4733 Obstructive sleep apnea (adult) (pediatric): Secondary | ICD-10-CM | POA: Diagnosis not present

## 2015-10-30 ENCOUNTER — Other Ambulatory Visit: Payer: Self-pay | Admitting: Family Medicine

## 2015-11-09 ENCOUNTER — Encounter: Payer: Self-pay | Admitting: Family Medicine

## 2015-11-09 ENCOUNTER — Ambulatory Visit (INDEPENDENT_AMBULATORY_CARE_PROVIDER_SITE_OTHER): Payer: PPO | Admitting: Family Medicine

## 2015-11-09 ENCOUNTER — Telehealth: Payer: Self-pay | Admitting: Pulmonary Disease

## 2015-11-09 VITALS — BP 132/68 | HR 80 | Resp 18 | Ht 64.0 in | Wt 262.0 lb

## 2015-11-09 DIAGNOSIS — Z7409 Other reduced mobility: Secondary | ICD-10-CM

## 2015-11-09 DIAGNOSIS — L299 Pruritus, unspecified: Secondary | ICD-10-CM | POA: Diagnosis not present

## 2015-11-09 DIAGNOSIS — E785 Hyperlipidemia, unspecified: Secondary | ICD-10-CM

## 2015-11-09 DIAGNOSIS — Z1211 Encounter for screening for malignant neoplasm of colon: Secondary | ICD-10-CM

## 2015-11-09 DIAGNOSIS — Z789 Other specified health status: Secondary | ICD-10-CM | POA: Insufficient documentation

## 2015-11-09 DIAGNOSIS — Z0000 Encounter for general adult medical examination without abnormal findings: Secondary | ICD-10-CM | POA: Diagnosis not present

## 2015-11-09 DIAGNOSIS — E559 Vitamin D deficiency, unspecified: Secondary | ICD-10-CM

## 2015-11-09 DIAGNOSIS — R600 Localized edema: Secondary | ICD-10-CM

## 2015-11-09 MED ORDER — FUROSEMIDE 40 MG PO TABS: 40.0000 mg | ORAL_TABLET | Freq: Two times a day (BID) | ORAL | Status: DC

## 2015-11-09 MED ORDER — HYDROXYZINE HCL 25 MG PO TABS: ORAL_TABLET | ORAL | Status: DC

## 2015-11-09 NOTE — Assessment & Plan Note (Addendum)
inadequately treated , increase  Lasix dose to twice daily, pt to follow low sodium diet , and eat primarily fresh or frozen fruit and vegetables. Leg elevation also recommended

## 2015-11-09 NOTE — Assessment & Plan Note (Signed)

## 2015-11-09 NOTE — Assessment & Plan Note (Addendum)
Uncontrolled , with lower extremity rash, start bedtime hydroxyzine , and pt encouraged to avoid any known triggers

## 2015-11-09 NOTE — Telephone Encounter (Signed)
HST 10/27/15 >> AHI 11.9, SaO2 low 85%.  Will have my nurse inform pt that sleep study shows mild sleep apnea.  Please schedule ROV with me to review tx options >> okay to double book visit.

## 2015-11-09 NOTE — Progress Notes (Signed)
    Angela Chase     MRN: [id number]      DOB: 06-22-1945  HPI: Patient is in for annual physical exam. Requests letter to excuse her from school since  December 2016 , to current , states she wishes to return in the upcoming semester, and feels physically able to do so Earlier this year she had come in reporting that hse had remained home bound for weeks with swollen legs and difficulty breathing, though at her most critically ill time she sought no health care C/o bilateral leg swelling, excessive itching and multiple bites on her body from insects she is exposed to in her current home environment, she is in the process of packing to move to her own place, reports being in a family member's home Denies symptoms of depression or anxiety , despite the fact that she has discontinued this medication independently in  Past several months PE: Pleasant  female, alert and oriented x 3, in no cardio-pulmonary distress.Morbidly obese Afebrile. HEENT No facial trauma or asymetry. Sinuses non tender.  Extra occullar muscles intact, pupils equally reactive to light. External ears normal, tympanic membranes clear. Oropharynx moist, no exudate. Neck: supple, no adenopathy,JVD or thyromegaly.No bruits.  Chest: Clear to ascultation bilaterally.No crackles or wheezes. Non tender to palpation  Breast: No asymetry,no masses or lumps. No tenderness. No nipple discharge or inversion. No axillary or supraclavicular adenopathy  Cardiovascular system; Heart sounds normal,  S1 and  S2 ,no S3.  No murmur, or thrill. Apical beat not displaced Peripheral pulses normal.  Abdomen: Soft, non tender, no organomegaly or masses. No bruits. Bowel sounds normal. No guarding, tenderness or rebound.  Rectal:  Normal sphincter tone. No rectal mass. Guaiac negative stool.  GU: External genitalia normal female genitalia , normal female distribution of hair. No lesions. Urethral meatus normal in size, no   Prolapse, no lesions visibly  Present. Bladder non tender. Vagina pink and moist , with no visible lesions , discharge present . Adequate pelvic support no  cystocele or rectocele noted Cervix pink and appears healthy, no lesions or ulcerations noted, no discharge noted from os Uterus normal size, no adnexal masses, no cervical motion or adnexal tenderness.   Musculoskeletal exam: Decreased though adequate ROM of spine, hips , shoulders and knees. Bilateral severe knee  deformity ,swelling and  crepitus noted. No muscle wasting or atrophy.   Neurologic: Cranial nerves 2 to 12 intact. Power, tone ,sensation and reflexes normal throughout. No disturbance in gait. No tremor.  Skin: Intact, no ulceration, erythema , scaling or rash noted. Pigmentation normal throughout  Psych; Normal mood and affect. Judgement and concentration normal   Assessment & Plan:  Bilateral leg edema inadequately treated , increase  Lasix dose to twice daily, pt to follow low sodium diet , and eat primarily fresh or frozen fruit and vegetables. Leg elevation also recommended  Annual physical exam Annual exam as documented. Counseling done  re healthy lifestyle involving commitment to 150 minutes exercise per week, heart healthy diet, and attaining healthy weight.The importance of adequate sleep also discussed. Regular seat belt use and home safety, is also discussed. Changes in health habits are decided on by the patient with goals and time frames  set for achieving them. Immunization and cancer screening needs are specifically addressed at this visit.   Pruritus Uncontrolled , with lower extremity rash, start bedtime hydroxyzine , and pt encouraged to avoid any known triggers

## 2015-11-09 NOTE — Patient Instructions (Addendum)
F/u in 4 month, cLLL IF YOU NEED ME SOONER  Increase FUROSEMIDE TO TWICE DAILYM, CONNTINUE POTASSIUM THREE TIMES DAILY  nEW IS HYDROXYZINE ONE AT BEDTIME FOR [facility]  LETTER PREPARED FOR SCHOOL  Please follow through with screening colonoscopy, referral has been made , as we discussed  Please work on good  health habits so that your health will improve. 1. Commitment to daily physical activity for 30 to 60  minutes, if you are able to do this.  2. Commitment to wise food choices. Aim for half of your  food intake to be vegetable and fruit, one quarter starchy foods, and one quarter protein. Try to eat on a regular schedule  3 meals per day, snacking between meals should be limited to vegetables or fruits or small portions of nuts. 64 ounces of water per day is generally recommended, unless you have specific health conditions, like heart failure or kidney failure where you will need to limit fluid intake.  3. Commitment to sufficient and a  good quality of physical and mental rest daily, generally between 6 to 8 hours per day.  WITH PERSISTANCE AND PERSEVERANCE, THE IMPOSSIBLE , BECOMES THE NORM! fASTING LIPID, CHEM, 7 , tsh AND VIT d IN 4 MONTHS

## 2015-11-10 ENCOUNTER — Encounter (INDEPENDENT_AMBULATORY_CARE_PROVIDER_SITE_OTHER): Payer: Self-pay | Admitting: *Deleted

## 2015-11-10 DIAGNOSIS — G4733 Obstructive sleep apnea (adult) (pediatric): Secondary | ICD-10-CM | POA: Diagnosis not present

## 2015-11-10 NOTE — Telephone Encounter (Signed)
Patient returned call, CB is [phone]

## 2015-11-10 NOTE — Telephone Encounter (Signed)
Patient advised of Sleep Study results. Appointment scheduled for patient to see Dr. Halford Chessman on 11/16/15 at 1130.  Patient aware of appointment and aware to arrive 15 min early. Nothing further needed.

## 2015-11-10 NOTE — Telephone Encounter (Signed)
LM for pt x 1  Okay to DB appt if needed on any light day. Thanks.

## 2015-11-13 ENCOUNTER — Other Ambulatory Visit: Payer: Self-pay | Admitting: *Deleted

## 2015-11-13 DIAGNOSIS — G4733 Obstructive sleep apnea (adult) (pediatric): Secondary | ICD-10-CM

## 2015-11-16 ENCOUNTER — Ambulatory Visit: Payer: PPO | Admitting: Pulmonary Disease

## 2015-11-17 ENCOUNTER — Ambulatory Visit (INDEPENDENT_AMBULATORY_CARE_PROVIDER_SITE_OTHER): Payer: PPO | Admitting: Pulmonary Disease

## 2015-11-17 ENCOUNTER — Encounter: Payer: Self-pay | Admitting: Pulmonary Disease

## 2015-11-17 VITALS — BP 122/60 | HR 90 | Ht 65.5 in | Wt 260.0 lb

## 2015-11-17 DIAGNOSIS — Z9989 Dependence on other enabling machines and devices: Principal | ICD-10-CM

## 2015-11-17 DIAGNOSIS — G4733 Obstructive sleep apnea (adult) (pediatric): Secondary | ICD-10-CM

## 2015-11-17 DIAGNOSIS — R05 Cough: Secondary | ICD-10-CM

## 2015-11-17 DIAGNOSIS — J45909 Unspecified asthma, uncomplicated: Secondary | ICD-10-CM | POA: Diagnosis not present

## 2015-11-17 DIAGNOSIS — R058 Other specified cough: Secondary | ICD-10-CM

## 2015-11-17 NOTE — Progress Notes (Signed)
Current Outpatient Prescriptions on File Prior to Visit  Medication Sig  . acetaminophen (TYLENOL) 500 MG tablet Take 500 mg by mouth every 6 (six) hours as needed.  Marland Kitchen albuterol (PROVENTIL HFA;VENTOLIN HFA) 108 (90 BASE) MCG/ACT inhaler Inhale 2 puffs into the lungs every 6 (six) hours as needed for wheezing or shortness of breath.  Marland Kitchen albuterol (PROVENTIL) (2.5 MG/3ML) 0.083% nebulizer solution Take 3 mLs (2.5 mg total) by nebulization every 6 (six) hours as needed for wheezing or shortness of breath.  Marland Kitchen aspirin 81 MG tablet Take 81 mg by mouth daily.  Marland Kitchen azelastine (ASTELIN) 0.1 % nasal spray Place 2 sprays into both nostrils 2 (two) times daily. Use in each nostril as directed  . diclofenac sodium (VOLTAREN) 1 % GEL Apply twice daily to each knee  . Fluticasone-Salmeterol (ADVAIR) 100-50 MCG/DOSE AEPB Inhale 1 puff into the lungs 2 (two) times daily.  . furosemide (LASIX) 40 MG tablet Take 1 tablet (40 mg total) by mouth 2 (two) times daily.  . hydrOXYzine (ATARAX/VISTARIL) 25 MG tablet One tablet at bedtime for itch  . ipratropium (ATROVENT) 0.02 % nebulizer solution Take 2.5 mLs (0.5 mg total) by nebulization every 6 (six) hours as needed for wheezing or shortness of breath. Use with albuterol solution  . mometasone (NASONEX) 50 MCG/ACT nasal spray Place 2 sprays into the nose daily.  . potassium chloride SA (K-DUR,KLOR-CON) 20 MEQ tablet Take 1 tablet (20 mEq total) by mouth 3 (three) times daily.  Marland Kitchen Spacer/Aero-Holding Dorise Bullion Use with inhaler  . Vitamin D, Ergocalciferol, (DRISDOL) 50000 units CAPS capsule TAKE 1 CAPSULE BY MOUTH EVERY 7 DAYS   No current facility-administered medications on file prior to visit.     Chief Complaint  Patient presents with  . Follow-up    Review sleep study.      Sleep tests PSG 07/27/13 >> AHI 46 HST 10/27/15 >> AHI 11.9, SaO2 low 85%.  Pulmonary tests Spirometry 09/13/14 >> FEV1 1.85 (91%), FEV1% 71  Cardiac tests Echo 09/02/15 >> EF 60 to  65%, mild LA dilation  Past medical hx HLD, OA, GERD, Anxiety, Depression  Past surgical hx, Allergies, Family hx, Social hx all reviewed.  Vital Signs BP 122/60 mmHg  Pulse 90  Ht 5' 5.5" (1.664 m)  Wt 260 lb (117.935 kg)  BMI 42.59 kg/m2  SpO2 98%  History of Present Illness Angela Chase is a 70 y.o. female with OSA, UACS, Asthma.  She is here to review her home sleep study.  This showed mild sleep apnea.  She has started drinking cucumber/lemon water > she has dropped two dress sizes since starting this.  She had work done on her house and had to move in with a friend >> no air conditioning.  Her breathing has been rough due to the heat, but she is going to move back into her home soon.  Physical Exam  General - No distress ENT - No sinus tenderness, no oral exudate, no LAN Cardiac - s1s2 regular, no murmur Chest - No wheeze/rales/dullness Back - No focal tenderness Abd - Soft, non-tender Ext - No edema Neuro - Normal strength Skin - No rashes Psych - normal mood, and behavior   Assessment/Plan  Obstructive sleep apnea. - will restart auto CPAP   Upper airway cough syndrome. - continue astelin, nasonex  Asthma. - continue advair and prn albuterol   Patient Instructions  Will arrange for CPAP set up  Follow up in 3 months  Chesley Mires, MD [facility] Pager:  [phone] 11/17/2015, 3:36 PM

## 2015-11-17 NOTE — Patient Instructions (Signed)
Will arrange for CPAP set up  Follow up in 3 months 

## 2015-12-22 IMAGING — CT CT NECK W/ CM
5 series · 16 of 33 positions shown, 18 images · IV contrast (Omnipaque 300)
Comparison: Face CT 07/21/2013

CLINICAL DATA: Shortness of breath and productive cough for 1 week.
Throat feels swollen. Fever and chills and leukocytosis.

EXAM:
CT NECK WITH CONTRAST
TECHNIQUE: Multidetector CT imaging of the neck was performed using the
standard protocol following the bolus administration of intravenous
contrast.
CONTRAST:  75mL OMNIPAQUE IOHEXOL 300 MG/ML  SOLN

[Series 2: soft tissue neck 2.0 b31s · axial · 0.52mm/px · z∈[+122,+202]mm · 2 of 122 slices shown]
[im 41/122  soft-tissue]
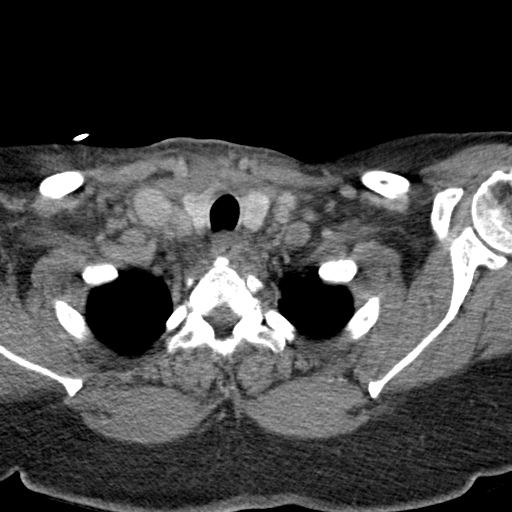
[im 81/122  soft-tissue]
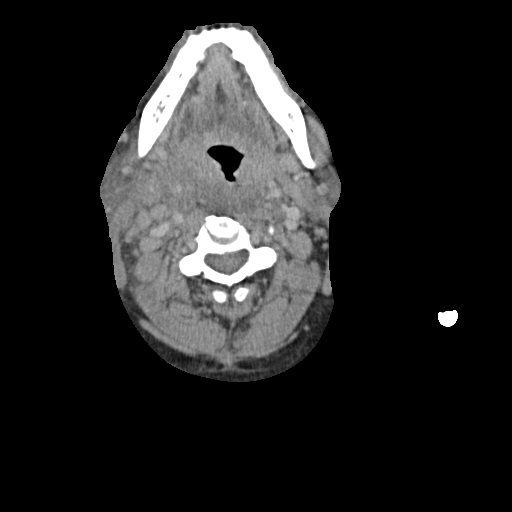

[Series 4: neck 2.0 soft tissue sag · sagittal · 0.42mm/px · 5 of 87 slices shown, 6 images]
[im 29/87  bone]
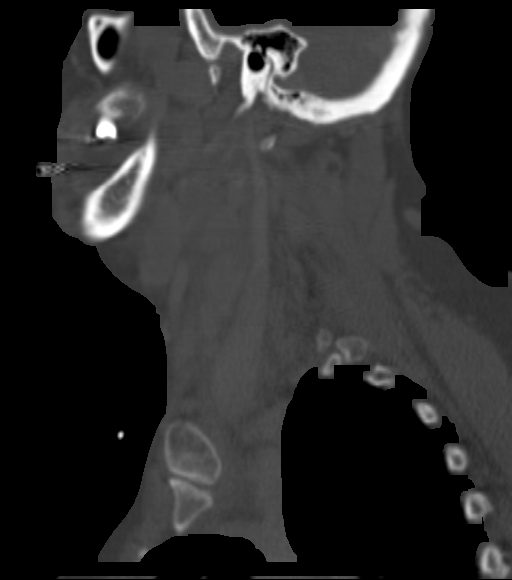
[im 36/87  bone]
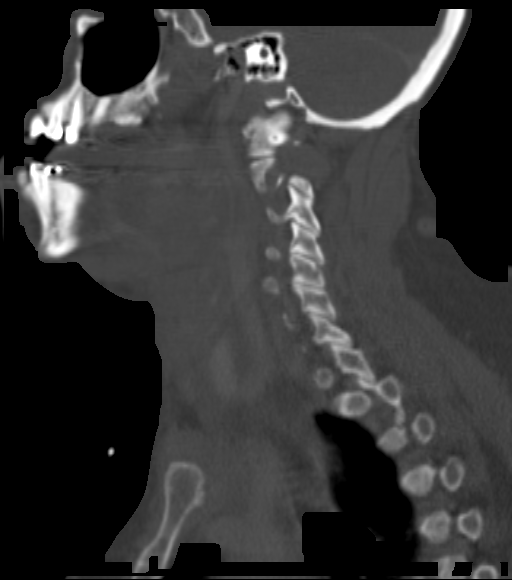
[im 44/87  soft-tissue]
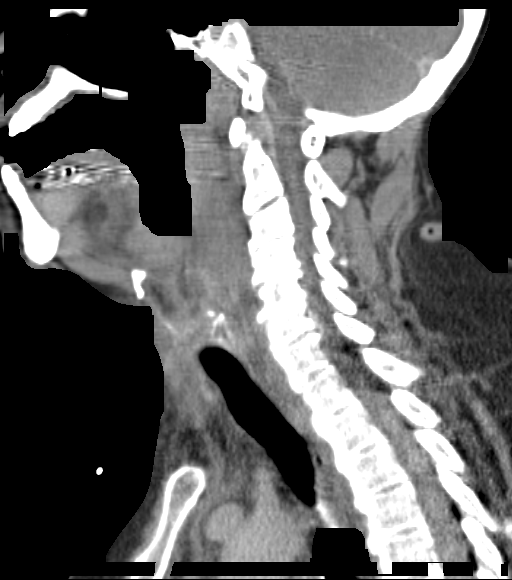
[im 44/87  bone]
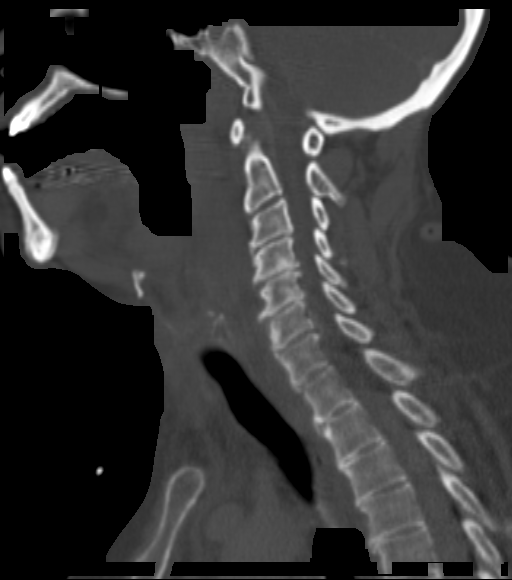
[im 51/87  bone]
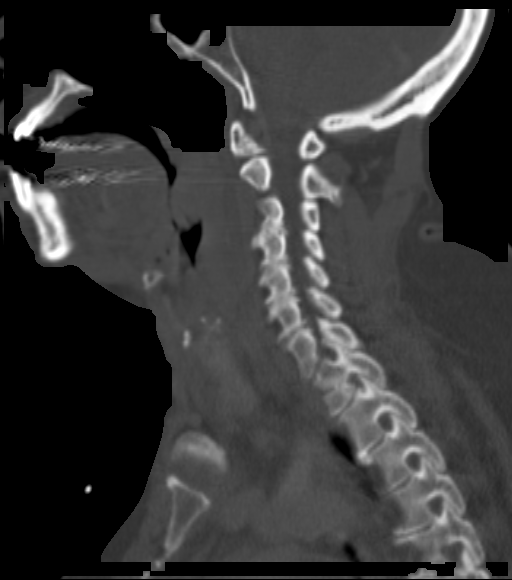
[im 58/87  bone]
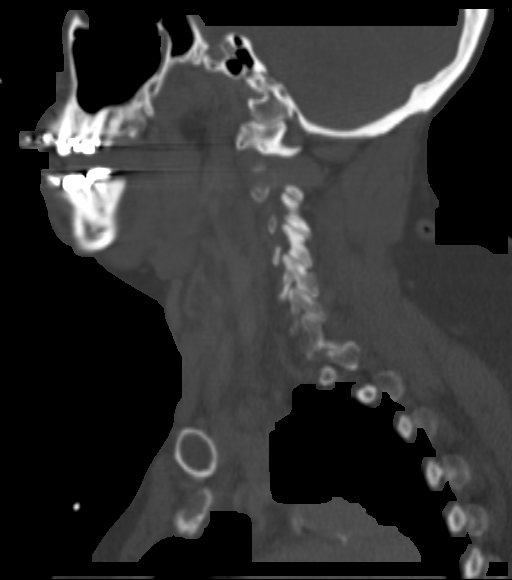

[Series 5: neck 2.0 soft tissue coro · coronal · 0.47mm/px · 3 of 102 slices shown]
[im 21/102  bone]
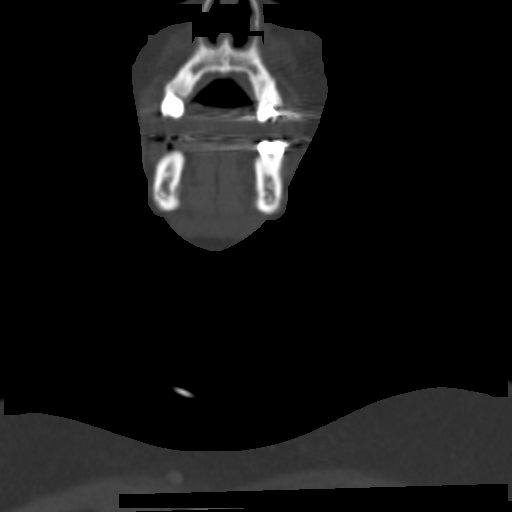
[im 41/102  bone]
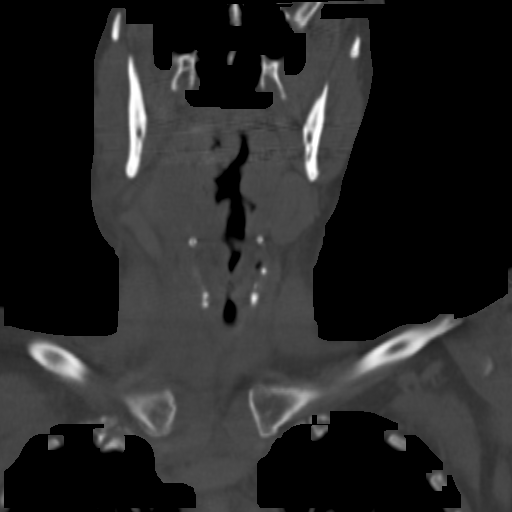
[im 61/102  bone]
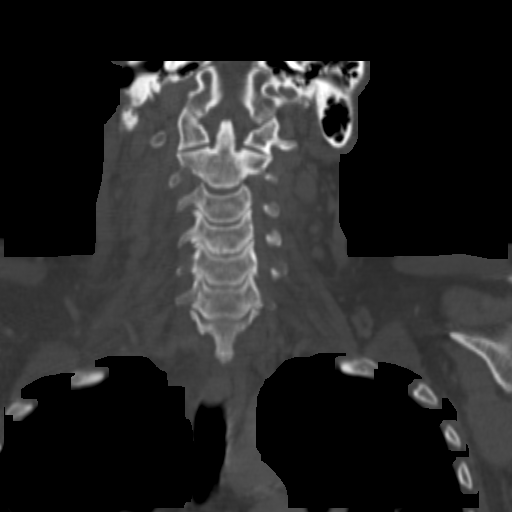

[Series 6: axial soft tissue neck 2.0 · axial · 0.35mm/px · z∈[+109,+228]mm · 3 of 121 slices shown, 4 images (1 of 2)]
[im 31/121  soft-tissue]
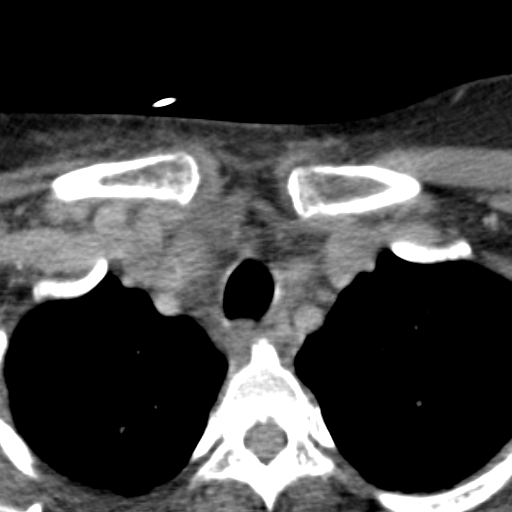
[im 31/121  bone]
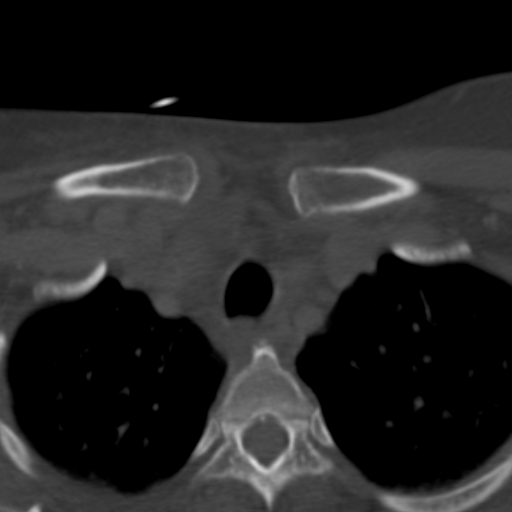
[im 61/121  bone]
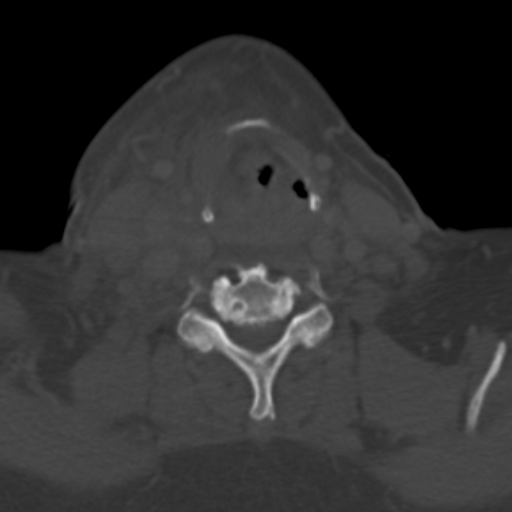
[im 91/121  bone]
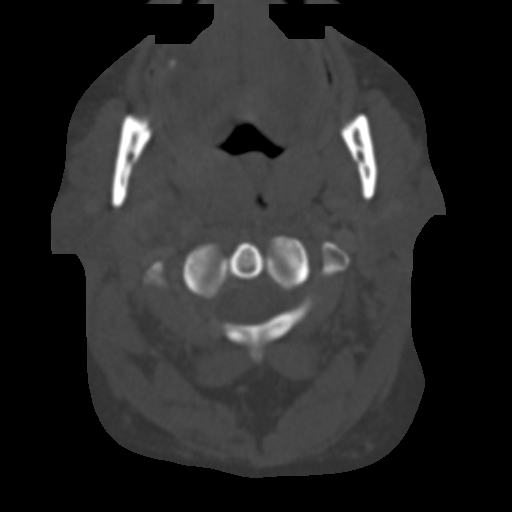

[Series 8: axial soft tissue neck 2.0 · axial · 0.42mm/px · z∈[+103,+223]mm · 3 of 121 slices shown (2 of 2)]
[im 31/121  soft-tissue]
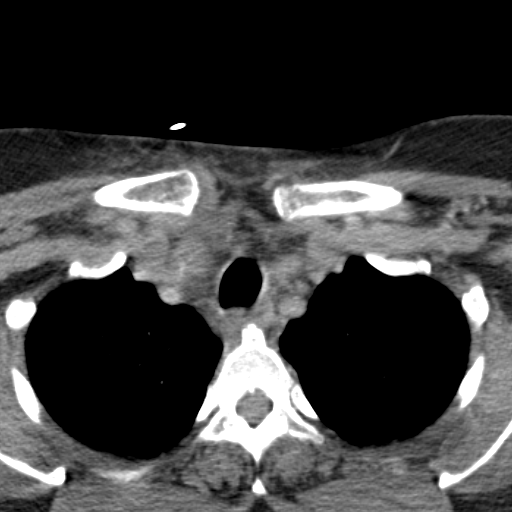
[im 61/121  soft-tissue]
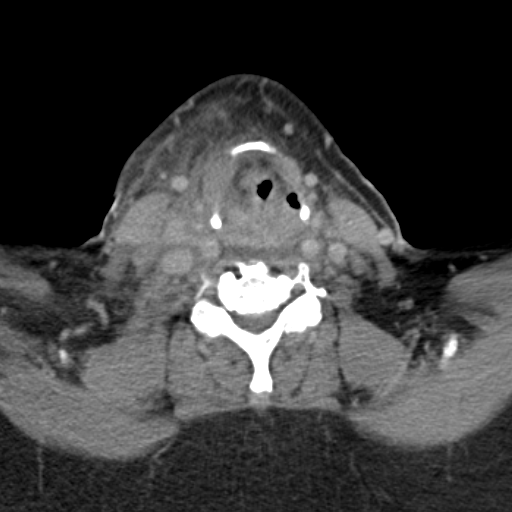
[im 91/121  soft-tissue]
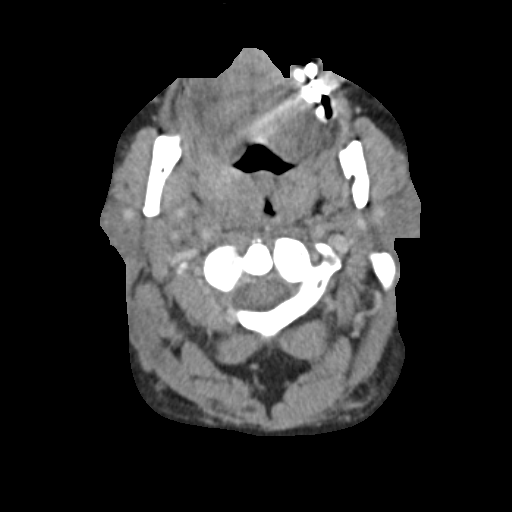

[16 of 33 positions shown; findings below may reference images not displayed]

FINDINGS: There is an advanced inflammatory process centered on the visceral
compartment of the neck, likely originating as tonsillitis or
pharyngitis in this patient with report of positive strep.
Peripharyngeal edema and submucosal edema of the oropharynx,
hypopharynx, and supraglottic larynx causes airway narrowing. The
epiglottis is not primarily thickened. Diffuse spread of edema
throughout the deep spaces of the neck, more extensive on the right.
There is low-density expansion of the retropharynx. Inflammation
extends into the upper mediastinum. Phlegmonous changes in the right
neck; There is no defined abscess for drainage. Bilateral reactive
appearing cervical lymphadenopathy. Patent major cervical veins. No
primary salivary gland inflammation. Negative thyroid gland. Clear
apical lungs.

Diffuse and advanced degenerative disc disease. A central disc
herniation at C3-4 causes advanced canal stenosis when combined with
congenital spine narrowing. Disc osteophyte complexes are present at
multiple cervical levels, also with spinal canal stenosis.

Critical Value/emergent results were called by telephone at the time
of interpretation on 02/23/2014 at [DATE] to Dr. AISPURO, KAARLI ,
who verbally acknowledged these results.
IMPRESSION: 1. Advanced infection of the visceral compartment, likely from
pharyngitis/tonsillitis. Extensive edema narrows the hypopharynx and
supraglottic larynx. Inflammatory changes extend to the upper
mediastinum. Phlegmonous changes present; no drainable abscess.
2. Multilevel spinal canal stenosis, severe at the level of C3-4.

## 2015-12-27 ENCOUNTER — Telehealth: Payer: Self-pay

## 2015-12-27 NOTE — Telephone Encounter (Signed)
Noted, tx

## 2015-12-28 ENCOUNTER — Ambulatory Visit (INDEPENDENT_AMBULATORY_CARE_PROVIDER_SITE_OTHER): Payer: PPO | Admitting: Family Medicine

## 2015-12-28 ENCOUNTER — Encounter: Payer: Self-pay | Admitting: Family Medicine

## 2015-12-28 VITALS — BP 122/78 | HR 96 | Temp 99.2°F | Resp 16 | Ht 65.0 in | Wt 264.0 lb

## 2015-12-28 DIAGNOSIS — J4521 Mild intermittent asthma with (acute) exacerbation: Secondary | ICD-10-CM

## 2015-12-28 DIAGNOSIS — R600 Localized edema: Secondary | ICD-10-CM | POA: Diagnosis not present

## 2015-12-28 DIAGNOSIS — Z6841 Body Mass Index (BMI) 40.0 and over, adult: Secondary | ICD-10-CM

## 2015-12-28 DIAGNOSIS — J0101 Acute recurrent maxillary sinusitis: Secondary | ICD-10-CM | POA: Diagnosis not present

## 2015-12-28 LAB — LIPID PANEL
CHOL/HDL RATIO: 2.6 ratio (ref <=5.0)
CHOLESTEROL: 213 mg/dL — AB (ref 125–200)
HDL: 82 mg/dL (ref >=46)
LDL Cholesterol: 113 mg/dL (ref <130)
Triglycerides: 92 mg/dL (ref <150)
VLDL: 18 mg/dL (ref <30)

## 2015-12-28 LAB — BASIC METABOLIC PANEL
BUN: 10 mg/dL (ref 7–25)
CALCIUM: 9.5 mg/dL (ref 8.6–10.4)
CO2: 29 mmol/L (ref 20–31)
Chloride: 100 mmol/L (ref 98–110)
Creat: 0.8 mg/dL (ref 0.50–0.99)
GLUCOSE: 86 mg/dL (ref 65–99)
Potassium: 4.1 mmol/L (ref 3.5–5.3)
SODIUM: 140 mmol/L (ref 135–146)

## 2015-12-28 LAB — TSH: TSH: 4.46 mIU/L

## 2015-12-28 MED ORDER — METHYLPREDNISOLONE ACETATE 80 MG/ML IJ SUSP
80.0000 mg | Freq: Once | INTRAMUSCULAR | Status: AC
  Administered 2015-12-28: 80 mg via INTRAMUSCULAR

## 2015-12-28 MED ORDER — LEVOFLOXACIN 500 MG PO TABS: 500.0000 mg | ORAL_TABLET | Freq: Every day | ORAL | 0 refills | Status: DC

## 2015-12-28 MED ORDER — IPRATROPIUM BROMIDE 0.02 % IN SOLN
0.5000 mg | Freq: Once | RESPIRATORY_TRACT | Status: AC
  Administered 2015-12-28: 0.5 mg via RESPIRATORY_TRACT

## 2015-12-28 MED ORDER — PROMETHAZINE-DM 6.25-15 MG/5ML PO SYRP: ORAL_SOLUTION | ORAL | 0 refills | Status: DC

## 2015-12-28 MED ORDER — PREDNISONE 5 MG PO TABS: 5.0000 mg | ORAL_TABLET | Freq: Two times a day (BID) | ORAL | 0 refills | Status: AC

## 2015-12-28 MED ORDER — ALBUTEROL SULFATE (2.5 MG/3ML) 0.083% IN NEBU
2.5000 mg | INHALATION_SOLUTION | Freq: Once | RESPIRATORY_TRACT | Status: AC
  Administered 2015-12-28: 2.5 mg via RESPIRATORY_TRACT

## 2015-12-28 NOTE — Assessment & Plan Note (Signed)
Deteriorated. Patient re-educated about  the importance of commitment to a  minimum of 150 minutes of exercise per week.  The importance of healthy food choices with portion control discussed. Encouraged to start a food diary, count calories and to consider  joining a support group. Sample diet sheets offered. Goals set by the patient for the next several months.   Weight /BMI 12/28/2015 11/17/2015 11/09/2015  WEIGHT 264 lb 260 lb 262 lb  HEIGHT 5\' 5"  5' 5.5" 5\' 4"   BMI 43.93 kg/m2 42.59 kg/m2 44.95 kg/m2

## 2015-12-28 NOTE — Assessment & Plan Note (Signed)
3 day  History, acute onset, levaquin prescribed

## 2015-12-28 NOTE — Assessment & Plan Note (Signed)
Improved on chronic diuretic

## 2015-12-28 NOTE — Progress Notes (Signed)
   Angela Chase     MRN: [id number]      DOB: October 13, 1945   HPI Angela Chase is here  3 day h/o worsening head and chest congestion, associated with fever and chills intermittently. Nasal drainage has thickened , and is yellowish green, and at times bloody. Sputum is thick and yellow. No C/o bilateral ear pressure, denies hearing loss and sore throat. Increasing fatigue , poor appetitie and sleep disturbed by cough. No improvement with OTC medication.   ROS  Denies chest pains, palpitations and leg swelling Denies abdominal pain, nausea, vomiting,diarrhea or constipation.   Denies dysuria, frequency, hesitancy or incontinence. C/o chronic  joint pain, swelling and limitation in mobility. Denies headaches, seizures, numbness, or tingling. Denies depression, anxiety or insomnia. Denies skin break down or rash.   PE  BP 122/78   Pulse 96   Temp 99.2 F (37.3 C) (Oral)   Resp 16   Ht 5\' 5"  (1.651 m)   Wt 264 lb (119.7 kg)   SpO2 97%   BMI 43.93 kg/m   Patient alert and oriented and in mild cardiopulmonary distress.  HEENT: No facial asymmetry, EOMI,   oropharynx pink and moist.  Neck supple no JVD, no mass.Maxillary sinus tenderness, left worse than right, left TM mildly erythematous, left anterior cervical adenitis  Chest: decreased air entry bilaterally, scattered wheezes, no crackles  CVS: S1, S2 no murmurs, no S3.Regular rate.  ABD: Soft non tender.   Ext: one plus pitting edema bilaterally  MS: Adequate though reduced  ROM spine, shoulders, hips and knees.  Skin: Intact, no ulcerations or rash noted.  Psych: Good eye contact, normal affect. Memory intact not anxious or depressed appearing.  CNS: CN 2-12 intact, power,  normal throughout.no focal deficits noted.   Assessment & Plan  Intrinsic asthma Flare of cough and wheeze x 3 days Depo medrol in office and neb treatment, followed by prednisone for 5 days only  Sinusitis, acute maxillary 3 day   History, acute onset, levaquin prescribed  Bilateral leg edema Improved on chronic diuretic  Morbid obesity with BMI of 40.0-44.9, adult Deteriorated. Patient re-educated about  the importance of commitment to a  minimum of 150 minutes of exercise per week.  The importance of healthy food choices with portion control discussed. Encouraged to start a food diary, count calories and to consider  joining a support group. Sample diet sheets offered. Goals set by the patient for the next several months.   Weight /BMI 12/28/2015 11/17/2015 11/09/2015  WEIGHT 264 lb 260 lb 262 lb  HEIGHT 5\' 5"  5' 5.5" 5\' 4"   BMI 43.93 kg/m2 42.59 kg/m2 44.95 kg/m2

## 2015-12-28 NOTE — Patient Instructions (Addendum)
F/u in Novemebr as before, call if you need me sooner  Return next month for flu vaccine  You are treated for acute sinusitis and asthma flare  Breathing treatment and depo medrol given in office  Levaquin, prednisone, and cough suppressant prescribed  Hope you feel better soon!  All the   Best with school

## 2015-12-28 NOTE — Assessment & Plan Note (Signed)
Flare of cough and wheeze x 3 days Depo medrol in office and neb treatment, followed by prednisone for 5 days only

## 2015-12-29 LAB — VITAMIN D 25 HYDROXY (VIT D DEFICIENCY, FRACTURES): Vit D, 25-Hydroxy: 32 ng/mL (ref 30–100)

## 2016-02-19 ENCOUNTER — Ambulatory Visit: Payer: PPO | Admitting: Pulmonary Disease

## 2016-03-06 ENCOUNTER — Telehealth: Payer: Self-pay

## 2016-03-06 NOTE — Telephone Encounter (Signed)
Noted that patient has an appt on 11/13.   Returned patient's call but voicemail is full

## 2016-03-06 NOTE — Telephone Encounter (Signed)
Patient called previously about same.  Stating that she needed an ov for the re certfication of need of cpap.  Advised patient to contact Dr. Juanetta Gosling office as he is the prescriber.  She las saw him in July.

## 2016-03-11 ENCOUNTER — Ambulatory Visit: Payer: PPO | Admitting: Family Medicine

## 2016-03-29 ENCOUNTER — Other Ambulatory Visit: Payer: Self-pay | Admitting: Family Medicine

## 2016-03-29 DIAGNOSIS — R600 Localized edema: Secondary | ICD-10-CM

## 2016-05-16 ENCOUNTER — Ambulatory Visit: Payer: PPO | Admitting: Family Medicine

## 2016-05-27 ENCOUNTER — Ambulatory Visit (INDEPENDENT_AMBULATORY_CARE_PROVIDER_SITE_OTHER): Payer: PPO | Admitting: Family Medicine

## 2016-05-27 ENCOUNTER — Encounter: Payer: Self-pay | Admitting: Family Medicine

## 2016-05-27 VITALS — BP 132/80 | HR 76 | Temp 98.1°F | Resp 20 | Ht 65.0 in | Wt 262.1 lb

## 2016-05-27 DIAGNOSIS — Z23 Encounter for immunization: Secondary | ICD-10-CM

## 2016-05-27 DIAGNOSIS — J302 Other seasonal allergic rhinitis: Secondary | ICD-10-CM

## 2016-05-27 DIAGNOSIS — J4521 Mild intermittent asthma with (acute) exacerbation: Secondary | ICD-10-CM

## 2016-05-27 DIAGNOSIS — G4733 Obstructive sleep apnea (adult) (pediatric): Secondary | ICD-10-CM

## 2016-05-27 DIAGNOSIS — J9801 Acute bronchospasm: Secondary | ICD-10-CM

## 2016-05-27 MED ORDER — AZITHROMYCIN 250 MG PO TABS: ORAL_TABLET | ORAL | 0 refills | Status: DC

## 2016-05-27 MED ORDER — PREDNISONE 20 MG PO TABS: 20.0000 mg | ORAL_TABLET | Freq: Two times a day (BID) | ORAL | 0 refills | Status: DC

## 2016-05-27 MED ORDER — FLUTICASONE-SALMETEROL 100-50 MCG/DOSE IN AEPB: 1.0000 | INHALATION_SPRAY | Freq: Two times a day (BID) | RESPIRATORY_TRACT | 5 refills | Status: DC

## 2016-05-27 MED ORDER — FUROSEMIDE 40 MG PO TABS: 40.0000 mg | ORAL_TABLET | Freq: Two times a day (BID) | ORAL | 1 refills | Status: DC

## 2016-05-27 MED ORDER — ALBUTEROL SULFATE HFA 108 (90 BASE) MCG/ACT IN AERS: 2.0000 | INHALATION_SPRAY | Freq: Four times a day (QID) | RESPIRATORY_TRACT | 0 refills | Status: DC | PRN

## 2016-05-27 NOTE — Progress Notes (Signed)
Chief Complaint  Patient presents with  . URI    x 4 days   Patient has asthma Is not using any of her inhalers Has had more dyspnea and cough for 4 weeks Now for the last 4-5 days had increased cough with yellow sputum and wheezing Minimal sinus pressure and pain but had PND and runny nose and hoarse voice No fever or chills  Patient Active Problem List   Diagnosis Date Noted  . Pruritus 11/09/2015  . Seasonal allergies 08/30/2015  . Bilateral leg edema 06/04/2015  . Recurrent falls while walking 02/01/2015  . Dermatitis 09/19/2014  . Urinary incontinence 04/01/2014  . Vitamin D deficiency 04/01/2014  . 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 Sinusitis, acute maxillary 07/29/2013  . Unspecified vitamin D deficiency 06/18/2013  . Depression 06/18/2013  . Osteoarthritis of both knees 06/18/2013  . Morbid obesity with BMI of 40.0-44.9, adult ([place]) 06/18/2013  . Insomnia 06/18/2013  . Shoulder pain, right 06/17/2013  . OSA (obstructive sleep apnea) 09/26/2011  . Hyperlipemia 05/25/2007    Outpatient Encounter Prescriptions as of 05/27/2016  Medication Sig  . acetaminophen (TYLENOL) 500 MG tablet Take 500 mg by mouth every 6 (six) hours as needed.  Marland Kitchen albuterol (PROVENTIL HFA;VENTOLIN HFA) 108 (90 Base) MCG/ACT inhaler Inhale 2 puffs into the lungs every 6 (six) hours as needed for wheezing or shortness of breath.  Marland Kitchen albuterol (PROVENTIL) (2.5 MG/3ML) 0.083% nebulizer solution Take 3 mLs (2.5 mg total) by nebulization every 6 (six) hours as needed for wheezing or shortness of breath.  Marland Kitchen aspirin 81 MG tablet Take 81 mg by mouth daily.  Marland Kitchen azelastine (ASTELIN) 0.1 % nasal spray Place 2 sprays into both nostrils 2 (two) times daily. Use in each nostril as directed  . diclofenac sodium (VOLTAREN) 1 % GEL Apply twice daily to each knee  . Fluticasone-Salmeterol (ADVAIR) 100-50 MCG/DOSE AEPB Inhale 1 puff into the lungs 2 (two) times daily.  . hydrOXYzine (ATARAX/VISTARIL) 25 MG tablet One  tablet at bedtime for itch  . ipratropium (ATROVENT) 0.02 % nebulizer solution Take 2.5 mLs (0.5 mg total) by nebulization every 6 (six) hours as needed for wheezing or shortness of breath. Use with albuterol solution  . mometasone (NASONEX) 50 MCG/ACT nasal spray Place 2 sprays into the nose daily.  . potassium chloride SA (K-DUR,KLOR-CON) 20 MEQ tablet TAKE 1 TABLET BY MOUTH THREE TIMES DAILY  . promethazine-dextromethorphan (PROMETHAZINE-DM) 6.25-15 MG/5ML syrup One teaspoon at bedtime ,a s needed, for excessive cough  . Spacer/Aero-Holding Dorise Bullion Use with inhaler  . Vitamin D, Ergocalciferol, (DRISDOL) 50000 units CAPS capsule TAKE 1 CAPSULE BY MOUTH EVERY 7 DAYS  . azithromycin (ZITHROMAX) 250 MG tablet Tad  . furosemide (LASIX) 40 MG tablet Take 1 tablet (40 mg total) by mouth 2 (two) times daily.  . predniSONE (DELTASONE) 20 MG tablet Take 1 tablet (20 mg total) by mouth 2 (two) times daily with a meal.   No facility-administered encounter medications on file as of 05/27/2016.     Allergies  Allergen Reactions  . Cephalexin Hives  . Omeprazole Hives  . Rofecoxib Rash    Review of Systems  Constitutional: Positive for fatigue. Negative for chills and fever.  HENT: Positive for congestion, postnasal drip, rhinorrhea and voice change. Negative for sinus pain and sore throat.   Eyes: Negative for redness and visual disturbance.  Respiratory: Positive for cough and wheezing.   Cardiovascular: Negative for chest pain and palpitations.  Gastrointestinal: Negative for diarrhea, nausea and  vomiting.  Genitourinary: Negative for difficulty urinating and frequency.  Musculoskeletal: Positive for myalgias. Negative for arthralgias.  Neurological: Positive for headaches. Negative for dizziness.  Hematological: Negative for adenopathy. Does not bruise/bleed easily.  Psychiatric/Behavioral: Negative.     BP 132/80 (BP Location: Right Arm, Patient Position: Sitting, Cuff Size: Normal)    Pulse 76   Temp 98.1 F (36.7 C) (Oral)   Resp 20   Ht 5\' 5"  (1.651 m)   Wt 262 lb 1.9 oz (118.9 kg)   SpO2 100%   BMI 43.62 kg/m   Physical Exam  Constitutional: She is oriented to person, place, and time. She appears well-developed and well-nourished. No distress.  Mildly ill, tired  HENT:  Head: Normocephalic and atraumatic.  Right Ear: External ear normal.  Left Ear: External ear normal.  Mouth/Throat: Oropharynx is clear and moist.  Posterior pharynx mild injection  Eyes: Conjunctivae are normal. Pupils are equal, round, and reactive to light.  Neck: Normal range of motion.  Cardiovascular: Normal rate, regular rhythm and normal heart sounds.   Pulmonary/Chest: Effort normal. No respiratory distress. She has wheezes. She has no rales.  Abdominal: Soft. Bowel sounds are normal.  Musculoskeletal: Normal range of motion. She exhibits no edema.  Lymphadenopathy:    She has no cervical adenopathy.  Neurological: She is alert and oriented to person, place, and time.  Psychiatric: She has a normal mood and affect. Her behavior is normal. Thought content normal.    ASSESSMENT/PLAN:  1. Chronic seasonal allergic rhinitis, unspecified trigger   2. OSA (obstructive sleep apnea)   3. Mild intermittent asthma with exacerbation   4. Bronchospasm  - albuterol (PROVENTIL HFA;VENTOLIN HFA) 108 (90 Base) MCG/ACT inhaler; Inhale 2 puffs into the lungs every 6 (six) hours as needed for wheezing or shortness of breath.  Dispense: 18 g; Refill: 0  5. Need for influenza vaccination  - Flu Vaccine QUAD 36+ mos IM   Patient Instructions  Drink plenty of water Take the antibiotic as directed Take 5 days of prednisone twice a day  Inhalers refilled as directed  Call for problems   Raylene Everts, MD

## 2016-05-27 NOTE — Patient Instructions (Addendum)
Drink plenty of water Take the antibiotic as directed Take 5 days of prednisone twice a day  Inhalers refilled as directed  Call for problems

## 2016-06-03 ENCOUNTER — Ambulatory Visit (INDEPENDENT_AMBULATORY_CARE_PROVIDER_SITE_OTHER): Payer: Medicare Other

## 2016-06-03 VITALS — BP 124/78 | HR 77 | Temp 98.2°F | Ht 65.0 in | Wt 257.0 lb

## 2016-06-03 DIAGNOSIS — Z1231 Encounter for screening mammogram for malignant neoplasm of breast: Secondary | ICD-10-CM | POA: Diagnosis not present

## 2016-06-03 DIAGNOSIS — Z0000 Encounter for general adult medical examination without abnormal findings: Secondary | ICD-10-CM | POA: Diagnosis not present

## 2016-06-03 DIAGNOSIS — Z13820 Encounter for screening for osteoporosis: Secondary | ICD-10-CM | POA: Diagnosis not present

## 2016-06-03 DIAGNOSIS — Z1211 Encounter for screening for malignant neoplasm of colon: Secondary | ICD-10-CM

## 2016-06-03 DIAGNOSIS — Z1239 Encounter for other screening for malignant neoplasm of breast: Secondary | ICD-10-CM

## 2016-06-03 DIAGNOSIS — R600 Localized edema: Secondary | ICD-10-CM

## 2016-06-03 MED ORDER — POTASSIUM CHLORIDE CRYS ER 20 MEQ PO TBCR: 20.0000 meq | EXTENDED_RELEASE_TABLET | Freq: Three times a day (TID) | ORAL | 1 refills | Status: DC

## 2016-06-03 MED ORDER — VITAMIN D (ERGOCALCIFEROL) 1.25 MG (50000 UNIT) PO CAPS: ORAL_CAPSULE | ORAL | 1 refills | Status: DC

## 2016-06-03 NOTE — Patient Instructions (Addendum)
Health maintenance: Due for Colonoscopy, referral sent today to Angela Chase. Repeat bone density test and mammogram was ordered today, please call Angela Chase and schedule these as soon as you are able.  Abnormal screenings: None   Patient concerns: Pain in both knees, recommend follow up with Angela Chase for possible injections. Not able to afford certain medications. Community resource referral sent today. You should receive a call from our care guide Angela Chase within 2 weeks.   Nurse concerns: Obesity, recommend exercising at least 3 times a week for 30 minutes at at time.   Next PCP appt: On 09/03/2016 at 1:20pm with Angela Chase. Follow up in 1 year for your annual wellness visit.   Advance directive discussed with patient today. Copy provided for patient to complete at home and have notarized. Patient agrees to have copy sent to our office once it is complete.    Preventive Care 28 Years and Older, Female Preventive care refers to lifestyle choices and visits with your health care provider that can promote health and wellness. What does preventive care include?  A yearly physical exam. This is also called an annual well check.  Dental exams once or twice a year.  Routine eye exams. Ask your health care provider how often you should have your eyes checked.  Personal lifestyle choices, including:  Daily care of your teeth and gums.  Regular physical activity.  Eating a healthy diet.  Avoiding tobacco and drug use.  Limiting alcohol use.  Practicing safe sex.  Taking low-dose aspirin every day.  Taking vitamin and mineral supplements as recommended by your health care provider. What happens during an annual well check? The services and screenings done by your health care provider during your annual well check will depend on your age, overall health, lifestyle risk factors, and family history of disease. Counseling  Your health care provider may ask you questions about  your:  Alcohol use.  Tobacco use.  Drug use.  Emotional well-being.  Home and relationship well-being.  Sexual activity.  Eating habits.  History of falls.  Memory and ability to understand (cognition).  Work and work Statistician.  Reproductive health. Screening  You may have the following tests or measurements:  Height, weight, and BMI.  Blood pressure.  Lipid and cholesterol levels. These may be checked every 5 years, or more frequently if you are over 4 years old.  Skin check.  Lung cancer screening. You may have this screening every year starting at age 73 if you have a 30-pack-year history of smoking and currently smoke or have quit within the past 15 years.  Fecal occult blood test (FOBT) of the stool. You may have this test every year starting at age 2.  Flexible sigmoidoscopy or colonoscopy. You may have a sigmoidoscopy every 5 years or a colonoscopy every 10 years starting at age 4.  Hepatitis C blood test.  Diabetes screening. This is done by checking your blood sugar (glucose) after you have not eaten for a while (fasting). You may have this done every 1-3 years.  Bone density scan. This is done to screen for osteoporosis. You may have this done starting at age 5.  Mammogram. This may be done every 1-2 years. Talk to your health care provider about how often you should have regular mammograms. Talk with your health care provider about your test results, treatment options, and if necessary, the need for more tests. Vaccines  Your health care provider may recommend certain vaccines, such as:  Influenza vaccine. This is recommended every year.  Tetanus, diphtheria, and acellular pertussis (Tdap, Td) vaccine. You may need a Td booster every 10 years.  Zoster vaccine. You may need this after age 33.  Measles, mumps, and rubella (MMR) vaccine. You may need at least one dose of MMR if you were born in 1957 or later. You may also need a second  dose.  Pneumococcal 13-valent conjugate (PCV13) vaccine. One dose is recommended after age 58.  Pneumococcal polysaccharide (PPSV23) vaccine. One dose is recommended after age 89.  Talk to your health care provider about which screenings and vaccines you need and how often you need them. This information is not intended to replace advice given to you by your health care provider. Make sure you discuss any questions you have with your health care provider. Document Released: 05/12/2015 Document Revised: 01/03/2016 Document Reviewed: 02/14/2015 Elsevier Interactive Patient Education  2017 [employer].

## 2016-06-03 NOTE — Progress Notes (Signed)
Subjective:   Angela Chase is a 71 y.o. female who presents for Medicare Annual (Subsequent) preventive examination.  Review of Systems:  Cardiac Risk Factors include: advanced age (>72men, >22 women);dyslipidemia;hypertension;obesity (BMI >30kg/m2);sedentary lifestyle;family history of premature cardiovascular disease     Objective:     Vitals: BP 124/78   Pulse 77   Temp 98.2 F (36.8 C) (Oral)   Ht 5\' 5"  (1.651 m)   Wt 257 lb 0.6 oz (116.6 kg)   SpO2 97%   BMI 42.77 kg/m   Body mass index is 42.77 kg/m.   Tobacco History  Smoking Status  . Never Smoker  Smokeless Tobacco  . Never Used     Counseling given: Not Answered   Past Medical History:  Diagnosis Date  . Anxiety   . Asthma 2010  . Depression   . GERD (gastroesophageal reflux disease) 1995  . Hoarseness   . Mixed hyperlipidemia   . Obstructive sleep apnea    On CPAP  . Osteoarthritis   . Wears glasses    Past Surgical History:  Procedure Laterality Date  . CHOLECYSTECTOMY  1985  . ROTATOR CUFF REPAIR Right 2005   Family History  Problem Relation Age of Onset  . Cancer Father   . Cancer Mother   . Heart attack Mother   . Cancer Brother    History  Sexual Activity  . Sexual activity: Not Currently    Outpatient Encounter Prescriptions as of 06/03/2016  Medication Sig  . acetaminophen (TYLENOL) 500 MG tablet Take 500 mg by mouth every 6 (six) hours as needed.  Marland Kitchen albuterol (PROVENTIL HFA;VENTOLIN HFA) 108 (90 Base) MCG/ACT inhaler Inhale 2 puffs into the lungs every 6 (six) hours as needed for wheezing or shortness of breath.  Marland Kitchen albuterol (PROVENTIL) (2.5 MG/3ML) 0.083% nebulizer solution Take 3 mLs (2.5 mg total) by nebulization every 6 (six) hours as needed for wheezing or shortness of breath.  Marland Kitchen azithromycin (ZITHROMAX) 250 MG tablet Tad  . Fluticasone-Salmeterol (ADVAIR) 100-50 MCG/DOSE AEPB Inhale 1 puff into the lungs 2 (two) times daily.  . furosemide (LASIX) 40 MG tablet Take 1  tablet (40 mg total) by mouth 2 (two) times daily.  Marland Kitchen ipratropium (ATROVENT) 0.02 % nebulizer solution Take 2.5 mLs (0.5 mg total) by nebulization every 6 (six) hours as needed for wheezing or shortness of breath. Use with albuterol solution  . Spacer/Aero-Holding Dorise Bullion Use with inhaler  . aspirin 81 MG tablet Take 81 mg by mouth daily.  Marland Kitchen azelastine (ASTELIN) 0.1 % nasal spray Place 2 sprays into both nostrils 2 (two) times daily. Use in each nostril as directed (Patient not taking: Reported on 06/03/2016)  . diclofenac sodium (VOLTAREN) 1 % GEL Apply twice daily to each knee (Patient not taking: Reported on 06/03/2016)  . hydrOXYzine (ATARAX/VISTARIL) 25 MG tablet One tablet at bedtime for itch (Patient not taking: Reported on 06/03/2016)  . mometasone (NASONEX) 50 MCG/ACT nasal spray Place 2 sprays into the nose daily. (Patient not taking: Reported on 06/03/2016)  . potassium chloride SA (K-DUR,KLOR-CON) 20 MEQ tablet TAKE 1 TABLET BY MOUTH THREE TIMES DAILY (Patient not taking: Reported on 06/03/2016)  . promethazine-dextromethorphan (PROMETHAZINE-DM) 6.25-15 MG/5ML syrup One teaspoon at bedtime ,a s needed, for excessive cough (Patient not taking: Reported on 06/03/2016)  . Vitamin D, Ergocalciferol, (DRISDOL) 50000 units CAPS capsule TAKE 1 CAPSULE BY MOUTH EVERY 7 DAYS (Patient not taking: Reported on 06/03/2016)  . [DISCONTINUED] predniSONE (DELTASONE) 20 MG tablet Take 1 tablet (  20 mg total) by mouth 2 (two) times daily with a meal.   No facility-administered encounter medications on file as of 06/03/2016.     Activities of Daily Living In your present state of health, do you have any difficulty performing the following activities: 06/03/2016 05/27/2016  Hearing? Y N  Vision? Y N  Difficulty concentrating or making decisions? N N  Walking or climbing stairs? Y N  Dressing or bathing? N N  Doing errands, shopping? N N  Preparing Food and eating ? N -  Using the Toilet? N -  In the past six  months, have you accidently leaked urine? N -  Do you have problems with loss of bowel control? N -  Managing your Medications? N -  Managing your Finances? N -  Housekeeping or managing your Housekeeping? N -  Some recent data might be hidden    Patient Care Team: Fayrene Helper, MD as PCP - General (Family Medicine) Leta Baptist, MD as Consulting Physician (Otolaryngology) Jiles Prows, MD as Consulting Physician (Allergy and Immunology) Satira Sark, MD as Consulting Physician (Cardiology)    Assessment:    Exercise Activities and Dietary recommendations Current Exercise Habits: The patient does not participate in regular exercise at present, Exercise limited by: orthopedic condition(s)  Goals    . Exercise 3x per week (30 min per time)          Patient would like to start exercising three times a week for at least 30 minutes at a time.      Fall Risk Fall Risk  06/03/2016 05/27/2016 05/16/2015 07/26/2014 06/17/2013  Falls in the past year? No No Yes No No  Number falls in past yr: - - 2 or more - -  Risk for fall due to : - - - - Impaired balance/gait;Impaired mobility  Risk for fall due to (comments): - - - - right knee instability   Depression Screen PHQ 2/9 Scores 06/03/2016 05/27/2016 08/30/2015 07/26/2014  PHQ - 2 Score 1 0 4 0  PHQ- 9 Score - - 8 -     Cognitive Function  Normal   6CIT Screen 06/03/2016  What Year? 0 points  What month? 0 points  What time? 0 points  Count back from 20 0 points  Months in reverse 0 points  Repeat phrase 0 points  Total Score 0    Immunization History  Administered Date(s) Administered  . Influenza Whole 02/26/2006  . Influenza,inj,Quad PF,36+ Mos 01/27/2013, 04/01/2014, 05/16/2015, 05/27/2016  . Pneumococcal Conjugate-13 07/26/2014  . Pneumococcal Polysaccharide-23 06/17/2013  . Td 12/07/2003  . Tdap 09/15/2014  . Zoster 06/17/2013   Screening Tests Health Maintenance  Topic Date Due  . COLONOSCOPY  04/27/1997  .  MAMMOGRAM  09/07/2016  . TETANUS/TDAP  09/14/2024  . INFLUENZA VACCINE  Completed  . DEXA SCAN  Completed  . ZOSTAVAX  Completed  . Hepatitis C Screening  Completed  . PNA vac Low Risk Adult  Completed      Plan:  I have personally reviewed and addressed the Medicare Annual Wellness questionnaire and have noted the following in the patient's chart:  A. Medical and social history B. Use of alcohol, tobacco or illicit drugs  C. Current medications and supplements D. Functional ability and status E.  Nutritional status F.  Physical activity G. Advance directives discussed today H. List of other physicians I.  Hospitalizations, surgeries, and ER visits in previous 12 months J.  Vitals K. Screenings to include hearing,  vision, cognitive, depression L. Referrals and appointments - Referral to Dr. Laural Golden was sent today to set up your colonoscopy. Mammogram and DEXA ordered today. Community resource referral sent today for medication assistance and assistance with your home.   In addition, I have reviewed and discussed with patient certain preventive protocols, quality metrics, and best practice recommendations. A written personalized care plan for preventive services as well as general preventive health recommendations were provided to patient.  Signed,   Stormy Fabian, LPN Lead Nurse Health Advisor

## 2016-06-05 ENCOUNTER — Encounter (INDEPENDENT_AMBULATORY_CARE_PROVIDER_SITE_OTHER): Payer: Self-pay | Admitting: *Deleted

## 2016-06-11 ENCOUNTER — Other Ambulatory Visit: Payer: Self-pay

## 2016-06-11 MED ORDER — VITAMIN D (ERGOCALCIFEROL) 1.25 MG (50000 UNIT) PO CAPS: ORAL_CAPSULE | ORAL | 1 refills | Status: DC

## 2016-07-25 ENCOUNTER — Other Ambulatory Visit: Payer: Self-pay | Admitting: Family Medicine

## 2016-09-03 ENCOUNTER — Encounter: Payer: Medicare Other | Admitting: Family Medicine

## 2016-10-15 ENCOUNTER — Telehealth: Payer: Self-pay | Admitting: *Deleted

## 2016-10-15 NOTE — Telephone Encounter (Signed)
Patient called stating she is having swelling and body aches, patient said she has had a cough and spitting up yellow fleam, patient requested to see Dr Moshe Cipro. Please advise [phone]

## 2016-10-17 NOTE — Telephone Encounter (Signed)
Called patient back and left a message to call back if still having a problem and we would schedule her an appt to be seen

## 2016-10-17 NOTE — Telephone Encounter (Signed)
Pt. to be seen tomorrow

## 2016-10-18 ENCOUNTER — Ambulatory Visit: Payer: Medicare Other | Admitting: Family Medicine

## 2016-10-22 ENCOUNTER — Telehealth: Payer: Self-pay | Admitting: *Deleted

## 2016-10-22 ENCOUNTER — Encounter: Payer: Self-pay | Admitting: Family Medicine

## 2016-10-22 ENCOUNTER — Other Ambulatory Visit: Payer: Self-pay | Admitting: Family Medicine

## 2016-10-22 ENCOUNTER — Ambulatory Visit (INDEPENDENT_AMBULATORY_CARE_PROVIDER_SITE_OTHER): Payer: Medicare Other | Admitting: Family Medicine

## 2016-10-22 VITALS — BP 128/80 | HR 91 | Resp 16 | Ht 65.0 in | Wt 269.0 lb

## 2016-10-22 DIAGNOSIS — F329 Major depressive disorder, single episode, unspecified: Secondary | ICD-10-CM

## 2016-10-22 DIAGNOSIS — M170 Bilateral primary osteoarthritis of knee: Secondary | ICD-10-CM | POA: Diagnosis not present

## 2016-10-22 DIAGNOSIS — R600 Localized edema: Secondary | ICD-10-CM

## 2016-10-22 DIAGNOSIS — Z1231 Encounter for screening mammogram for malignant neoplasm of breast: Secondary | ICD-10-CM

## 2016-10-22 DIAGNOSIS — Z6841 Body Mass Index (BMI) 40.0 and over, adult: Secondary | ICD-10-CM

## 2016-10-22 MED ORDER — FUROSEMIDE 40 MG PO TABS: 40.0000 mg | ORAL_TABLET | Freq: Two times a day (BID) | ORAL | 1 refills | Status: DC

## 2016-10-22 MED ORDER — METOLAZONE 2.5 MG PO TABS: ORAL_TABLET | ORAL | 0 refills | Status: DC

## 2016-10-22 MED ORDER — CITALOPRAM HYDROBROMIDE 10 MG PO TABS: 10.0000 mg | ORAL_TABLET | Freq: Every day | ORAL | 1 refills | Status: DC

## 2016-10-22 MED ORDER — HYDROXYZINE HCL 25 MG PO TABS: ORAL_TABLET | ORAL | 1 refills | Status: DC

## 2016-10-22 MED ORDER — POTASSIUM CHLORIDE CRYS ER 20 MEQ PO TBCR: 20.0000 meq | EXTENDED_RELEASE_TABLET | Freq: Three times a day (TID) | ORAL | 2 refills | Status: DC

## 2016-10-22 NOTE — Assessment & Plan Note (Signed)
One week h/o uncontrolled leg swelling, only started taking lasix yesterday, twice daily lasix for 10 days and zaroxolyn for 4 days

## 2016-10-22 NOTE — Telephone Encounter (Signed)
Noted,mmedication sent for bedtime use on;ly, attempted to inform pt, no message left

## 2016-10-22 NOTE — Patient Instructions (Addendum)
F/u in 4 weeks, call if you need me sooner  Two medications  Are sent for leg swelling, please fill today and start taking as directed  You need your mammogram, we will call and schedule this for you, your number to call is [phone]  New medcation  Sent for stress and anxiety   Non fasting chem 7 3 days before  Next visit

## 2016-10-22 NOTE — Telephone Encounter (Signed)
I spoke with patient and gave her, her appointment information for Dr Moshe Cipro and [facility]. Patient requested for Dr Moshe Cipro to give her something for itching.

## 2016-10-28 ENCOUNTER — Telehealth: Payer: Self-pay | Admitting: *Deleted

## 2016-10-28 ENCOUNTER — Ambulatory Visit (HOSPITAL_COMMUNITY): Payer: Medicare Other

## 2016-10-28 NOTE — Telephone Encounter (Signed)
Left message to call office

## 2016-10-28 NOTE — Telephone Encounter (Signed)
Spoke to pt, would like dr simpson to take a look at her, needs an appt please.

## 2016-10-28 NOTE — Telephone Encounter (Signed)
Patient called stating she fell on a hard concrete floor and landed on her right side and her left hip hurts and she is having lower back pain, patient states it hurts to walk. Please is requesting hydrocodone to be filled. Please advise [phone]

## 2016-10-29 NOTE — Telephone Encounter (Signed)
I called patient to schedule an appointment, no answer left a message.

## 2016-11-01 ENCOUNTER — Telehealth: Payer: Self-pay | Admitting: Family Medicine

## 2016-11-01 NOTE — Telephone Encounter (Signed)
Double book 9:40 or 10 on 16th

## 2016-11-01 NOTE — Telephone Encounter (Signed)
Patient was seen on June 26th and was given a 4 week f/u. Since visit, patient fell (2 days after appt w/Simpson) and injured her "whole right side".  She states she was headed to the pharmacy for fluid pills and fell on her porch.  Patient has a lot of pain (especiallly neck, R shoulder and R knee).  She has taken tylenol for pain (helps some).  She has not had any xrays and has not been to ER. She is not able to drive.  Several attempts were made to reach the patient after she fell and had called in requesting appt, but were unsuccessful because her phone was not charged and the call was not going thru. Patient has a follow up on 7/23 but Dr. Moshe Cipro had instructed front desk to give patient a sooner appt.  Please advise as to when patient can be seen or what she can do until appt .    cb  [phone]

## 2016-11-01 NOTE — Telephone Encounter (Signed)
Can you do this for me.

## 2016-11-03 ENCOUNTER — Encounter: Payer: Self-pay | Admitting: Family Medicine

## 2016-11-03 NOTE — Assessment & Plan Note (Signed)
Increased fall risk, encouraged weight reduction

## 2016-11-03 NOTE — Progress Notes (Signed)
   Angela Chase     MRN: [id number]      DOB: [birth year]   HPI Angela Chase is here for follow up and re-evaluation of chronic medical conditions, medication management and review of any available recent lab and radiology data.  C/o bilateral leg swelling, denies PND and orthopnea, had chest pain recently but feels it was stress related . Preventive health is updated, specifically  Cancer screening and Immunization.    The PT denies any adverse reactions to current medications since the last visit.     ROS Denies recent fever or chills. Denies sinus pressure, nasal congestion, ear pain or sore throat. Denies chest congestion, productive cough or wheezing. Denies chest pains, palpitations  Denies abdominal pain, nausea, vomiting,diarrhea or constipation.   Denies dysuria, frequency, hesitancy or incontinence. C/o chronic  joint pain, swelling and limitation in mobility. Denies headaches, seizures, numbness, or tingling. Denies depression, anxiety or insomnia. Denies skin break down or rash.   PE  BP 128/80   Pulse 91   Resp 16   Ht 5\' 5"  (1.651 m)   Wt 269 lb (122 kg)   SpO2 94%   BMI 44.76 kg/m   Patient alert and oriented and in no cardiopulmonary distress.  HEENT: No facial asymmetry, EOMI,   oropharynx pink and moist.  Neck supple no JVD, no mass.  Chest: Clear to auscultation bilaterally.  CVS: S1, S2 no murmurs, no S3.Regular rate.  ABD: Soft non tender.   Ext: two plus pitting edema  MS: decreased  ROM spine, shoulders, hips and knees.  Skin: Intact, no ulcerations or rash noted.  Psych: Good eye contact, normal affect. Memory intact not anxious or depressed appearing.  CNS: CN 2-12 intact, power,  normal throughout.no focal deficits noted.   Assessment & Plan  Bilateral leg edema One week h/o uncontrolled leg swelling, only started taking lasix yesterday, twice daily lasix for 10 days and zaroxolyn for 4 days  Depression Increased stress and  anxiety , start citalopram daily, not suicidal or homicidal  Morbid obesity with BMI of 40.0-44.9, adult Deteriorated. Patient re-educated about  the importance of commitment to a  minimum of 150 minutes of exercise per week.  The importance of healthy food choices with portion control discussed. Encouraged to start a food diary, count calories and to consider  joining a support group. Sample diet sheets offered. Goals set by the patient for the next several months.   Weight /BMI 10/22/2016 06/03/2016 05/27/2016  WEIGHT 269 lb 257 lb 0.6 oz 262 lb 1.9 oz  HEIGHT 5\' 5"  5\' 5"  5\' 5"   BMI 44.76 kg/m2 42.77 kg/m2 43.62 kg/m2      Osteoarthritis of both knees Increased fall risk, encouraged weight reduction

## 2016-11-03 NOTE — Assessment & Plan Note (Signed)
Deteriorated. Patient re-educated about  the importance of commitment to a  minimum of 150 minutes of exercise per week.  The importance of healthy food choices with portion control discussed. Encouraged to start a food diary, count calories and to consider  joining a support group. Sample diet sheets offered. Goals set by the patient for the next several months.   Weight /BMI 10/22/2016 06/03/2016 05/27/2016  WEIGHT 269 lb 257 lb 0.6 oz 262 lb 1.9 oz  HEIGHT 5\' 5"  5\' 5"  5\' 5"   BMI 44.76 kg/m2 42.77 kg/m2 43.62 kg/m2

## 2016-11-03 NOTE — Assessment & Plan Note (Signed)
Increased stress and anxiety , start citalopram daily, not suicidal or homicidal

## 2016-11-06 ENCOUNTER — Encounter (HOSPITAL_COMMUNITY): Payer: Self-pay

## 2016-11-06 ENCOUNTER — Ambulatory Visit (HOSPITAL_COMMUNITY)
Admission: RE | Admit: 2016-11-06 | Discharge: 2016-11-06 | Disposition: A | Discharge status: Home / Self Care | Payer: Medicare Other | Source: Ambulatory Visit | Attending: Family Medicine | Admitting: Family Medicine

## 2016-11-06 DIAGNOSIS — Z1231 Encounter for screening mammogram for malignant neoplasm of breast: Secondary | ICD-10-CM

## 2016-11-07 ENCOUNTER — Ambulatory Visit (HOSPITAL_COMMUNITY): Payer: Medicare Other

## 2016-11-11 ENCOUNTER — Encounter: Payer: Self-pay | Admitting: Family Medicine

## 2016-11-11 ENCOUNTER — Ambulatory Visit (INDEPENDENT_AMBULATORY_CARE_PROVIDER_SITE_OTHER): Payer: Medicare Other | Admitting: Family Medicine

## 2016-11-11 VITALS — BP 112/80 | HR 81 | Resp 16 | Ht 65.0 in | Wt 263.0 lb

## 2016-11-11 DIAGNOSIS — R600 Localized edema: Secondary | ICD-10-CM

## 2016-11-11 DIAGNOSIS — F418 Other specified anxiety disorders: Secondary | ICD-10-CM | POA: Diagnosis not present

## 2016-11-11 DIAGNOSIS — M174 Other bilateral secondary osteoarthritis of knee: Secondary | ICD-10-CM | POA: Diagnosis not present

## 2016-11-11 DIAGNOSIS — Z6841 Body Mass Index (BMI) 40.0 and over, adult: Secondary | ICD-10-CM | POA: Diagnosis not present

## 2016-11-11 DIAGNOSIS — W19XXXS Unspecified fall, sequela: Secondary | ICD-10-CM

## 2016-11-11 DIAGNOSIS — J45909 Unspecified asthma, uncomplicated: Secondary | ICD-10-CM | POA: Diagnosis not present

## 2016-11-11 DIAGNOSIS — E785 Hyperlipidemia, unspecified: Secondary | ICD-10-CM | POA: Diagnosis not present

## 2016-11-11 DIAGNOSIS — E559 Vitamin D deficiency, unspecified: Secondary | ICD-10-CM

## 2016-11-11 LAB — LIPID PANEL
CHOL/HDL RATIO: 2.8 ratio (ref <5.0)
Cholesterol: 215 mg/dL — ABNORMAL HIGH (ref <200)
HDL: 77 mg/dL (ref >50)
LDL CALC: 123 mg/dL — AB (ref <100)
TRIGLYCERIDES: 73 mg/dL (ref <150)
VLDL: 15 mg/dL (ref <30)

## 2016-11-11 LAB — BASIC METABOLIC PANEL
BUN: 8 mg/dL (ref 7–25)
CO2: 27 mmol/L (ref 20–31)
CREATININE: 0.86 mg/dL (ref 0.50–0.99)
Calcium: 9.5 mg/dL (ref 8.6–10.4)
Chloride: 98 mmol/L (ref 98–110)
GLUCOSE: 83 mg/dL (ref 65–99)
POTASSIUM: 3.7 mmol/L (ref 3.5–5.3)
Sodium: 138 mmol/L (ref 135–146)

## 2016-11-11 LAB — CBC
HEMATOCRIT: 41.6 % (ref 35.0–45.0)
HEMOGLOBIN: 13.8 g/dL (ref 11.7–15.5)
MCH: 26.3 pg — ABNORMAL LOW (ref 27.0–33.0)
MCHC: 33.2 g/dL (ref 32.0–36.0)
MCV: 79.2 fL — AB (ref 80.0–100.0)
MPV: 9.3 fL (ref 7.5–12.5)
Platelets: 265 10*3/uL (ref 140–400)
RBC: 5.25 MIL/uL — AB (ref 3.80–5.10)
RDW: 14.5 % (ref 11.0–15.0)
WBC: 5.6 10*3/uL (ref 3.8–10.8)

## 2016-11-11 LAB — TSH: TSH: 4.98 mIU/L — ABNORMAL HIGH

## 2016-11-11 MED ORDER — METHYLPREDNISOLONE ACETATE 80 MG/ML IJ SUSP
80.0000 mg | Freq: Once | INTRAMUSCULAR | Status: AC
  Administered 2016-11-11: 80 mg via INTRAMUSCULAR

## 2016-11-11 MED ORDER — DICLOFENAC SODIUM 1 % TD GEL: TRANSDERMAL | 2 refills | Status: DC

## 2016-11-11 MED ORDER — PREDNISONE 5 MG PO TABS: 5.0000 mg | ORAL_TABLET | Freq: Two times a day (BID) | ORAL | 0 refills | Status: AC

## 2016-11-11 MED ORDER — CITALOPRAM HYDROBROMIDE 10 MG PO TABS: 10.0000 mg | ORAL_TABLET | Freq: Every day | ORAL | 4 refills | Status: DC

## 2016-11-11 MED ORDER — MONTELUKAST SODIUM 10 MG PO TABS: 10.0000 mg | ORAL_TABLET | Freq: Every day | ORAL | 3 refills | Status: DC

## 2016-11-11 NOTE — Patient Instructions (Addendum)
Physical exam in 4.5 month, call if you need me before  Depomedol 80 mg IM today for generalized pain and 5 days of prednisone sent in   New is is daily singulair  Compression hose, leg elevation and weight loss , helps with leg swelling     Labs today, cBC, lipid, tsh, chem 7, vit D   Pls sched colonoscopy, letter is provided to call  Thank you  for choosing [facility]. We consider it a privelige to serve you.  Delivering excellent health care in a caring and  compassionate way is our goal.  Partnering with you,  so that together we can achieve this goal is our strategy.

## 2016-11-12 LAB — VITAMIN D 25 HYDROXY (VIT D DEFICIENCY, FRACTURES): Vit D, 25-Hydroxy: 24 ng/mL — ABNORMAL LOW (ref 30–100)

## 2016-11-13 ENCOUNTER — Encounter: Payer: Self-pay | Admitting: Family Medicine

## 2016-11-18 ENCOUNTER — Ambulatory Visit: Payer: Medicare Other | Admitting: Family Medicine

## 2016-11-30 DIAGNOSIS — W19XXXS Unspecified fall, sequela: Secondary | ICD-10-CM | POA: Insufficient documentation

## 2016-11-30 HISTORY — DX: Unspecified fall, sequela: W19.XXXS

## 2016-11-30 NOTE — Assessment & Plan Note (Signed)
Improved with diuresis Patient re-educated about  the importance of commitment to a  minimum of 150 minutes of exercise per week.  The importance of healthy food choices with portion control discussed. Encouraged to start a food diary, count calories and to consider  joining a support group. Sample diet sheets offered. Goals set by the patient for the next several months.   Weight /BMI 11/11/2016 10/22/2016 06/03/2016  WEIGHT 263 lb 269 lb 257 lb 0.6 oz  HEIGHT 5\' 5"  5\' 5"  5\' 5"   BMI 43.77 kg/m2 44.76 kg/m2 42.77 kg/m2

## 2016-11-30 NOTE — Assessment & Plan Note (Signed)
Pt to resume daily citalopram advised her of need to maintain daily medication Not suicidal or homicidal Still talking about being displaced from her home

## 2016-11-30 NOTE — Assessment & Plan Note (Signed)
Currently stable, pt to start daily singulair

## 2016-11-30 NOTE — Progress Notes (Signed)
   Angela Chase     MRN: [id number]      DOB: 07/06/1945   HPI Angela Chase is here for follow up and re-evaluation of bilateral leg swelling.She does report marked improvement with diuretic therapy she received, denies excessive cramps or light headedness Unfortunately, she fell on 6/28 on concrete and since that time she has had increased back , hip and knee pain ROS Denies recent fever or chills. C/o generalized pain Denies sinus pressure, nasal congestion, ear pain or sore throat. Denies chest congestion, productive cough or wheezing. Denies chest pains, palpitations and leg swelling Denies abdominal pain, nausea, vomiting,diarrhea or constipation.   Denies dysuria, frequency, hesitancy or incontinence. Denies depression, anxiety or insomnia. Denies skin break down or rash.   PE  BP 112/80   Pulse 81   Resp 16   Ht 5\' 5"  (1.651 m)   Wt 263 lb (119.3 kg)   SpO2 98%   BMI 43.77 kg/m   Patient alert and oriented and in no cardiopulmonary distress.  HEENT: No facial asymmetry, EOMI,   oropharynx pink and moist.  Neck supple no JVD, no mass.  Chest: Clear to auscultation bilaterally.  CVS: S1, S2 no murmurs, no S3.Regular rate.  ABD: Soft non tender.   Ext: No edema  MS: Decreased ROM spine, shoulders, hips and knees.  Skin: Intact, no ulcerations or rash noted.  Psych: Good eye contact, normal affect. Memory intact not anxious or depressed appearing.  CNS: CN 2-12 intact, power,  normal throughout.no focal deficits noted.   Assessment & Plan  Bilateral leg edema Marked improvement following aggressive diuresis, bilateral compression hose and leg elevation recommended also  Osteoarthritis of both knees Increased and uncontrolled pain following recent fall, depo medro 80 mg iM followed by short course of predniosne  Falls, sequela Reporting generalized uncontrolled pain, will treat with anti inflammatory  Depression Pt to resume daily citalopram  advised her of need to maintain daily medication Not suicidal or homicidal Still talking about being displaced from her home   Vitamin D deficiency Maintained on weekly vit D supplement  Morbid obesity with BMI of 40.0-44.9, adult Improved with diuresis Patient re-educated about  the importance of commitment to a  minimum of 150 minutes of exercise per week.  The importance of healthy food choices with portion control discussed. Encouraged to start a food diary, count calories and to consider  joining a support group. Sample diet sheets offered. Goals set by the patient for the next several months.   Weight /BMI 11/11/2016 10/22/2016 06/03/2016  WEIGHT 263 lb 269 lb 257 lb 0.6 oz  HEIGHT 5\' 5"  5\' 5"  5\' 5"   BMI 43.77 kg/m2 44.76 kg/m2 42.77 kg/m2      Intrinsic asthma Currently stable, pt to start daily singulair

## 2016-11-30 NOTE — Assessment & Plan Note (Signed)
Marked improvement following aggressive diuresis, bilateral compression hose and leg elevation recommended also

## 2016-11-30 NOTE — Assessment & Plan Note (Signed)
Reporting generalized uncontrolled pain, will treat with anti inflammatory

## 2016-11-30 NOTE — Assessment & Plan Note (Signed)
Increased and uncontrolled pain following recent fall, depo medro 80 mg iM followed by short course of predniosne

## 2016-11-30 NOTE — Assessment & Plan Note (Signed)
Maintained on weekly vit D supplement

## 2017-01-24 ENCOUNTER — Other Ambulatory Visit: Payer: Self-pay | Admitting: Cardiology

## 2017-01-24 DIAGNOSIS — R079 Chest pain, unspecified: Secondary | ICD-10-CM

## 2017-02-24 ENCOUNTER — Encounter (HOSPITAL_COMMUNITY)
Admission: RE | Admit: 2017-02-24 | Discharge: 2017-02-24 | Disposition: A | Discharge status: Home / Self Care | Payer: Medicare Other | Source: Ambulatory Visit | Attending: Cardiology | Admitting: Cardiology

## 2017-02-24 DIAGNOSIS — R079 Chest pain, unspecified: Secondary | ICD-10-CM | POA: Insufficient documentation

## 2017-02-24 MED ORDER — REGADENOSON 0.4 MG/5ML IV SOLN
0.4000 mg | Freq: Once | INTRAVENOUS | Status: AC
  Administered 2017-02-24: 0.4 mg via INTRAVENOUS

## 2017-02-24 MED ORDER — TECHNETIUM TC 99M TETROFOSMIN IV KIT
30.0000 | PACK | Freq: Once | INTRAVENOUS | Status: AC | PRN
  Administered 2017-02-24: 30 via INTRAVENOUS

## 2017-02-24 MED ORDER — REGADENOSON 0.4 MG/5ML IV SOLN
INTRAVENOUS | Status: AC
  Filled 2017-02-24: qty 5

## 2017-02-25 ENCOUNTER — Ambulatory Visit (INDEPENDENT_AMBULATORY_CARE_PROVIDER_SITE_OTHER): Payer: Medicare Other

## 2017-02-25 DIAGNOSIS — Z23 Encounter for immunization: Secondary | ICD-10-CM

## 2017-02-25 MED ORDER — TECHNETIUM TC 99M TETROFOSMIN IV KIT
30.0000 | PACK | Freq: Once | INTRAVENOUS | Status: AC | PRN
  Administered 2017-02-25: 30 via INTRAVENOUS

## 2017-03-04 ENCOUNTER — Encounter: Payer: Medicare Other | Admitting: Family Medicine

## 2017-04-08 ENCOUNTER — Ambulatory Visit: Payer: Medicare Other | Admitting: Family Medicine

## 2017-05-28 ENCOUNTER — Encounter: Payer: Self-pay | Admitting: Family Medicine

## 2017-05-28 ENCOUNTER — Ambulatory Visit (INDEPENDENT_AMBULATORY_CARE_PROVIDER_SITE_OTHER): Payer: Medicare Other | Admitting: Family Medicine

## 2017-05-28 VITALS — BP 120/74 | HR 74 | Temp 98.7°F | Ht 65.0 in | Wt 249.1 lb

## 2017-05-28 DIAGNOSIS — J45909 Unspecified asthma, uncomplicated: Secondary | ICD-10-CM | POA: Diagnosis not present

## 2017-05-28 DIAGNOSIS — J302 Other seasonal allergic rhinitis: Secondary | ICD-10-CM | POA: Diagnosis not present

## 2017-05-28 DIAGNOSIS — Z6841 Body Mass Index (BMI) 40.0 and over, adult: Secondary | ICD-10-CM | POA: Diagnosis not present

## 2017-05-28 DIAGNOSIS — F329 Major depressive disorder, single episode, unspecified: Secondary | ICD-10-CM

## 2017-05-28 DIAGNOSIS — F32A Depression, unspecified: Secondary | ICD-10-CM

## 2017-05-28 DIAGNOSIS — M170 Bilateral primary osteoarthritis of knee: Secondary | ICD-10-CM

## 2017-05-28 DIAGNOSIS — J209 Acute bronchitis, unspecified: Secondary | ICD-10-CM | POA: Diagnosis not present

## 2017-05-28 MED ORDER — HYDROCODONE-ACETAMINOPHEN 5-325 MG PO TABS: ORAL_TABLET | ORAL | 0 refills | Status: DC

## 2017-05-28 MED ORDER — PREDNISONE 20 MG PO TABS: ORAL_TABLET | ORAL | 0 refills | Status: DC

## 2017-05-28 MED ORDER — HYDROXYZINE HCL 25 MG PO TABS: ORAL_TABLET | ORAL | 3 refills | Status: DC

## 2017-05-28 MED ORDER — AZITHROMYCIN 250 MG PO TABS: ORAL_TABLET | ORAL | 0 refills | Status: DC

## 2017-05-28 MED ORDER — BENZONATATE 100 MG PO CAPS: 100.0000 mg | ORAL_CAPSULE | Freq: Two times a day (BID) | ORAL | 0 refills | Status: DC | PRN

## 2017-05-28 MED ORDER — KETOROLAC TROMETHAMINE 60 MG/2ML IM SOLN
60.0000 mg | Freq: Once | INTRAMUSCULAR | Status: AC
  Administered 2017-05-28: 60 mg via INTRAMUSCULAR

## 2017-05-28 MED ORDER — METHYLPREDNISOLONE ACETATE 80 MG/ML IJ SUSP
80.0000 mg | Freq: Once | INTRAMUSCULAR | Status: AC
  Administered 2017-05-28: 80 mg via INTRAMUSCULAR

## 2017-05-28 NOTE — Patient Instructions (Signed)
Physical exam mid March , call if you need me sooner, PLEASE keep appointment!  You are treated for acute bronchitis and uncontrolled pain from arthritis in the knees  Injections in  office and medication sent to your pharmacy  [facility] you are able to get things sorted out
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
## 2017-05-28 NOTE — Assessment & Plan Note (Addendum)
Uncontrolled.Toradol and depo medrol administered IM in the office , to be followed by a short course of oral prednisone  Limited amount of hydrocodone prescribed for short term use only

## 2017-06-05 ENCOUNTER — Ambulatory Visit: Payer: Medicare Other | Admitting: Family Medicine

## 2017-06-10 ENCOUNTER — Encounter: Payer: Self-pay | Admitting: Family Medicine

## 2017-06-10 NOTE — Assessment & Plan Note (Signed)
Z pack, prednisone dose pack and decongestants prescribed

## 2017-06-10 NOTE — Assessment & Plan Note (Signed)
Updated lab needed at/ before next visit.   

## 2017-06-10 NOTE — Assessment & Plan Note (Addendum)
Mild flare , resume medication, not suicidal or homicidal

## 2017-06-10 NOTE — Assessment & Plan Note (Signed)
Improved Patient re-educated about  the importance of commitment to a  minimum of 150 minutes of exercise per week.  The importance of healthy food choices with portion control discussed. Encouraged to start a food diary, count calories and to consider  joining a support group. Sample diet sheets offered. Goals set by the patient for the next several months.   Weight /BMI 05/28/2017 11/11/2016 10/22/2016  WEIGHT 249 lb 1.3 oz 263 lb 269 lb  HEIGHT 5\' 5"  5\' 5"  5\' 5"   BMI 41.45 kg/m2 43.77 kg/m2 44.76 kg/m2

## 2017-06-10 NOTE — Assessment & Plan Note (Signed)
Mild flare due to current infection

## 2017-06-10 NOTE — Assessment & Plan Note (Signed)
Controlled, no change in medication  

## 2017-07-07 ENCOUNTER — Encounter: Payer: Self-pay | Admitting: Family Medicine

## 2017-07-16 ENCOUNTER — Ambulatory Visit (INDEPENDENT_AMBULATORY_CARE_PROVIDER_SITE_OTHER): Payer: Medicare Other | Admitting: Family Medicine

## 2017-07-16 ENCOUNTER — Other Ambulatory Visit (HOSPITAL_COMMUNITY)
Admission: RE | Admit: 2017-07-16 | Discharge: 2017-07-16 | Disposition: A | Discharge status: Home / Self Care | Payer: Medicare Other | Source: Ambulatory Visit | Attending: Family Medicine | Admitting: Family Medicine

## 2017-07-16 ENCOUNTER — Other Ambulatory Visit: Payer: Self-pay | Admitting: Family Medicine

## 2017-07-16 ENCOUNTER — Encounter: Payer: Self-pay | Admitting: Family Medicine

## 2017-07-16 ENCOUNTER — Telehealth: Payer: Self-pay | Admitting: Family Medicine

## 2017-07-16 VITALS — BP 120/82 | HR 81 | Resp 16 | Ht 65.0 in | Wt 251.0 lb

## 2017-07-16 DIAGNOSIS — Z124 Encounter for screening for malignant neoplasm of cervix: Secondary | ICD-10-CM | POA: Insufficient documentation

## 2017-07-16 DIAGNOSIS — Z6841 Body Mass Index (BMI) 40.0 and over, adult: Secondary | ICD-10-CM

## 2017-07-16 DIAGNOSIS — F32A Depression, unspecified: Secondary | ICD-10-CM

## 2017-07-16 DIAGNOSIS — Z0000 Encounter for general adult medical examination without abnormal findings: Secondary | ICD-10-CM | POA: Diagnosis not present

## 2017-07-16 DIAGNOSIS — F329 Major depressive disorder, single episode, unspecified: Secondary | ICD-10-CM

## 2017-07-16 MED ORDER — FUROSEMIDE 40 MG PO TABS: 40.0000 mg | ORAL_TABLET | Freq: Two times a day (BID) | ORAL | 3 refills | Status: DC

## 2017-07-16 MED ORDER — HYDROXYZINE HCL 25 MG PO TABS: ORAL_TABLET | ORAL | 1 refills | Status: DC

## 2017-07-16 MED ORDER — SERTRALINE HCL 25 MG PO TABS: 25.0000 mg | ORAL_TABLET | Freq: Every day | ORAL | 3 refills | Status: DC

## 2017-07-16 MED ORDER — POTASSIUM CHLORIDE CRYS ER 20 MEQ PO TBCR: 20.0000 meq | EXTENDED_RELEASE_TABLET | Freq: Three times a day (TID) | ORAL | 2 refills | Status: DC

## 2017-07-16 MED ORDER — VITAMIN D (ERGOCALCIFEROL) 1.25 MG (50000 UNIT) PO CAPS: ORAL_CAPSULE | ORAL | 1 refills | Status: DC

## 2017-07-16 MED ORDER — METOLAZONE 2.5 MG PO TABS: ORAL_TABLET | ORAL | 0 refills | Status: DC

## 2017-07-16 NOTE — Telephone Encounter (Signed)
pls let her know hydroxyzine has been sent in

## 2017-07-16 NOTE — Patient Instructions (Addendum)
F/U early June, call if you need me before   Medications are sent in for leg swelling and depression   You are also referred to psychotherapy, Ava , will call you for counseling/ therapy, I believe this will help with the medication  Please be careful not to fall  Please get eyes taken care of

## 2017-07-16 NOTE — Telephone Encounter (Signed)
Patient states she forgot to tell Dr.Simpson she is itching all over.

## 2017-07-17 NOTE — Telephone Encounter (Signed)
Called patient regarding message below. No answer, unable to leave message. Mailbox full 

## 2017-07-18 LAB — CYTOLOGY - PAP
Adequacy: ABSENT
Diagnosis: NEGATIVE

## 2017-07-27 ENCOUNTER — Encounter: Payer: Self-pay | Admitting: Family Medicine

## 2017-07-27 NOTE — Assessment & Plan Note (Signed)
Deteriorated. Patient re-educated about  the importance of commitment to a  minimum of 150 minutes of exercise per week.  The importance of healthy food choices with portion control discussed. Encouraged to start a food diary, count calories and to consider  joining a support group. Sample diet sheets offered. Goals set by the patient for the next several months.   Weight /BMI 07/16/2017 05/28/2017 11/11/2016  WEIGHT 251 lb 249 lb 1.3 oz 263 lb  HEIGHT 5\' 5"  5\' 5"  5\' 5"   BMI 41.77 kg/m2 41.45 kg/m2 43.77 kg/m2

## 2017-07-27 NOTE — Assessment & Plan Note (Signed)
Start medication , would benefit from therapy but no interest currently

## 2017-07-27 NOTE — Progress Notes (Signed)
    Angela Chase     MRN: [id number]      DOB: Sep 01, 1945  HPI: Patient is in for annual physical exam. No other health concerns are expressed or addressed at the visit. She c/o depression, not suicidal or homicidal Recent labs, if available are reviewed. Immunization is reviewed , and  updated if needed.   PE: BP 120/82   Pulse 81   Resp 16   Ht 5\' 5"  (1.651 m)   Wt 251 lb (113.9 kg)   SpO2 96%   BMI 41.77 kg/m   Pleasant  female, alert and oriented x 3, in no cardio-pulmonary distress. Afebrile. HEENT No facial trauma or asymetry. Sinuses non tender.  Extra occullar muscles intact,  External ears normal, tympanic membranes clear. Oropharynx moist, no exudate. Neck: supple, no adenopathy,JVD or thyromegaly.No bruits.  Chest: Clear to ascultation bilaterally.No crackles or wheezes. Non tender to palpation  Breast: No asymetry,no masses or lumps. No tenderness. No nipple discharge or inversion. No axillary or supraclavicular adenopathy  Cardiovascular system; Heart sounds normal,  S1 and  S2 ,no S3.  No murmur, or thrill. Apical beat not displaced Peripheral pulses normal.  Abdomen: Soft, non tender, no organomegaly or masses. No bruits. Bowel sounds normal. No guarding, tenderness or rebound.  Rectal:  Pt to return stool cards.  GU: External genitalia normal female genitalia , normal female distribution of hair. No lesions. Urethral meatus normal in size, no  Prolapse, no lesions visibly  Present. Bladder non tender. Vagina pink and moist , with no visible lesions , discharge present . Adequate pelvic support no  cystocele or rectocele noted Cervix pink and appears healthy, no lesions or ulcerations noted, no discharge noted from os Uterus normal size, no adnexal masses, no cervical motion or adnexal tenderness.   Musculoskeletal exam: Decreased ROM of spine, hips , shoulders and knees. Deformity ,swelling and  crepitus noted.in both knees  No  muscle wasting or atrophy.   Neurologic: Cranial nerves 2 to 12 intact. Power, tone ,sensation and reflexes normal throughout. Abnormal gait. No tremor.  Skin: Intact, no ulceration, erythema , scaling or rash noted. Pigmentation normal throughout  Psych; Normal mood and affect. Judgement and concentration normal   Assessment & Plan:  Depression Start medication , would benefit from therapy but no interest currently  Morbid obesity with BMI of 40.0-44.9, adult Deteriorated. Patient re-educated about  the importance of commitment to a  minimum of 150 minutes of exercise per week.  The importance of healthy food choices with portion control discussed. Encouraged to start a food diary, count calories and to consider  joining a support group. Sample diet sheets offered. Goals set by the patient for the next several months.   Weight /BMI 07/16/2017 05/28/2017 11/11/2016  WEIGHT 251 lb 249 lb 1.3 oz 263 lb  HEIGHT 5\' 5"  5\' 5"  5\' 5"   BMI 41.77 kg/m2 41.45 kg/m2 43.77 kg/m2      Annual physical exam Annual exam as documented. Counseling done  re healthy lifestyle involving commitment to 150 minutes exercise per week, heart healthy diet, and attaining healthy weight.The importance of adequate sleep also discussed. Regular seat belt use and home safety, is also discussed. Changes in health habits are decided on by the patient with goals and time frames  set for achieving them. Immunization and cancer screening needs are specifically addressed at this visit.

## 2017-07-27 NOTE — Assessment & Plan Note (Signed)

## 2017-10-02 ENCOUNTER — Ambulatory Visit: Payer: Medicare Other | Admitting: Family Medicine

## 2017-10-02 ENCOUNTER — Encounter: Payer: Self-pay | Admitting: Family Medicine

## 2017-10-02 VITALS — BP 138/80 | HR 83 | Resp 16 | Ht 65.0 in | Wt 247.0 lb

## 2017-10-02 DIAGNOSIS — Z604 Social exclusion and rejection: Secondary | ICD-10-CM

## 2017-10-02 DIAGNOSIS — L309 Dermatitis, unspecified: Secondary | ICD-10-CM | POA: Diagnosis not present

## 2017-10-02 DIAGNOSIS — R600 Localized edema: Secondary | ICD-10-CM

## 2017-10-02 DIAGNOSIS — M170 Bilateral primary osteoarthritis of knee: Secondary | ICD-10-CM

## 2017-10-02 DIAGNOSIS — J45909 Unspecified asthma, uncomplicated: Secondary | ICD-10-CM

## 2017-10-02 DIAGNOSIS — Z6841 Body Mass Index (BMI) 40.0 and over, adult: Secondary | ICD-10-CM

## 2017-10-02 MED ORDER — FLUTICASONE-SALMETEROL 100-50 MCG/DOSE IN AEPB: 1.0000 | INHALATION_SPRAY | Freq: Two times a day (BID) | RESPIRATORY_TRACT | 5 refills | Status: DC

## 2017-10-02 MED ORDER — HYDROCODONE-ACETAMINOPHEN 5-325 MG PO TABS: 1.0000 | ORAL_TABLET | Freq: Every evening | ORAL | 0 refills | Status: DC | PRN

## 2017-10-02 MED ORDER — SULFAMETHOXAZOLE-TRIMETHOPRIM 800-160 MG PO TABS: 1.0000 | ORAL_TABLET | Freq: Two times a day (BID) | ORAL | 0 refills | Status: DC

## 2017-10-02 MED ORDER — UNABLE TO FIND: 0 refills | Status: DC

## 2017-10-02 MED ORDER — HYDROXYZINE HCL 10 MG PO TABS: 10.0000 mg | ORAL_TABLET | Freq: Three times a day (TID) | ORAL | 0 refills | Status: DC | PRN

## 2017-10-02 MED ORDER — PREDNISONE 5 MG (21) PO TBPK: ORAL_TABLET | ORAL | 0 refills | Status: DC

## 2017-10-02 NOTE — Patient Instructions (Addendum)
F/U in 3 months, call if you need me before call if you need me before  Antibiotic prednisone and Hydroxyzine are sent for  Rash knee and itch   Night time vicodin will be sent in limited supply for right knee pain   Pls call back with name of orthopedic Doctor you want  I am referring you to Dr Nevada Crane about your skin  Potassium will be refilled  Hope yopu feel better soon  Sorry about your stress

## 2017-10-04 ENCOUNTER — Encounter: Payer: Self-pay | Admitting: Family Medicine

## 2017-10-04 DIAGNOSIS — Z604 Social exclusion and rejection: Secondary | ICD-10-CM | POA: Insufficient documentation

## 2017-10-04 MED ORDER — POTASSIUM CHLORIDE CRYS ER 20 MEQ PO TBCR: 20.0000 meq | EXTENDED_RELEASE_TABLET | Freq: Three times a day (TID) | ORAL | 5 refills | Status: DC

## 2017-10-04 MED ORDER — CLOBETASOL PROPIONATE 0.05 % EX CREA: TOPICAL_CREAM | CUTANEOUS | 0 refills | Status: DC

## 2017-10-04 MED ORDER — FUROSEMIDE 40 MG PO TABS: 40.0000 mg | ORAL_TABLET | Freq: Two times a day (BID) | ORAL | 5 refills | Status: DC

## 2017-10-04 NOTE — Assessment & Plan Note (Signed)
Leg elevation and continual use of diuretic with potassium supplement and compression hose, also sodium restriction
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
## 2017-10-04 NOTE — Assessment & Plan Note (Signed)
Severe , extensive hyperpigmented macular rash affecting primarily upper extremities and trunk, oral steroids and topical steroid, antibiotic and hydroxyzine, also dermatology referral

## 2017-10-04 NOTE — Assessment & Plan Note (Signed)
Progressively worsening with uncontrolled pain and instability affecting right more than left knee , wants referral to ortho in [place] and will call back with name of Doc, limited bed tine vicodin prescribed and fall precautions reviewed

## 2017-10-04 NOTE — Assessment & Plan Note (Signed)
Slightly improved, pt encouraged to limit caloric intake consistently to facilitate weight loss. Patient re-educated about  the importance of commitment to a  minimum of 150 minutes of exercise per week.  The importance of healthy food choices with portion control discussed. Encouraged to start a food diary, count calories and to consider  joining a support group. Sample diet sheets offered. Goals set by the patient for the next several months.   Weight /BMI 10/02/2017 07/16/2017 05/28/2017  WEIGHT 247 lb 251 lb 249 lb 1.3 oz  HEIGHT 5\' 5"  5\' 5"  5\' 5"   BMI 41.1 kg/m2 41.77 kg/m2 41.45 kg/m2

## 2017-10-04 NOTE — Assessment & Plan Note (Signed)
Reports uncontrolled wheezing and needs neb machine, on exam she is wheezing with reduced air entry, neb machine prescribed

## 2017-10-08 ENCOUNTER — Other Ambulatory Visit: Payer: Self-pay

## 2017-10-08 DIAGNOSIS — J45909 Unspecified asthma, uncomplicated: Secondary | ICD-10-CM

## 2017-10-09 ENCOUNTER — Telehealth: Payer: Self-pay | Admitting: Family Medicine

## 2017-10-09 NOTE — Telephone Encounter (Signed)
It was sent to [facility] the same day she was here and they confirmed receipt. Has she been to the drugstore?

## 2017-10-09 NOTE — Telephone Encounter (Signed)
Patient states she has not received her potassium & she is cramping up. 336/ [phone]

## 2017-11-12 LAB — FECAL OCCULT BLOOD, IMMUNOCHEMICAL: IMMUNOLOGICAL FECAL OCCULT BLOOD TEST: NEGATIVE

## 2017-12-04 ENCOUNTER — Other Ambulatory Visit: Payer: Self-pay | Admitting: Family Medicine

## 2018-01-06 ENCOUNTER — Ambulatory Visit (INDEPENDENT_AMBULATORY_CARE_PROVIDER_SITE_OTHER): Payer: Medicare Other | Admitting: Family Medicine

## 2018-01-06 ENCOUNTER — Encounter: Payer: Self-pay | Admitting: Family Medicine

## 2018-01-06 VITALS — BP 120/80 | HR 80 | Resp 16 | Ht 65.0 in | Wt 250.0 lb

## 2018-01-06 DIAGNOSIS — E559 Vitamin D deficiency, unspecified: Secondary | ICD-10-CM

## 2018-01-06 DIAGNOSIS — E785 Hyperlipidemia, unspecified: Secondary | ICD-10-CM

## 2018-01-06 DIAGNOSIS — Z6841 Body Mass Index (BMI) 40.0 and over, adult: Secondary | ICD-10-CM

## 2018-01-06 DIAGNOSIS — R600 Localized edema: Secondary | ICD-10-CM

## 2018-01-06 DIAGNOSIS — F32A Depression, unspecified: Secondary | ICD-10-CM

## 2018-01-06 DIAGNOSIS — J45909 Unspecified asthma, uncomplicated: Secondary | ICD-10-CM

## 2018-01-06 DIAGNOSIS — M174 Other bilateral secondary osteoarthritis of knee: Secondary | ICD-10-CM | POA: Diagnosis not present

## 2018-01-06 DIAGNOSIS — Z1231 Encounter for screening mammogram for malignant neoplasm of breast: Secondary | ICD-10-CM

## 2018-01-06 DIAGNOSIS — H543 Unqualified visual loss, both eyes: Secondary | ICD-10-CM

## 2018-01-06 DIAGNOSIS — Z23 Encounter for immunization: Secondary | ICD-10-CM

## 2018-01-06 DIAGNOSIS — F329 Major depressive disorder, single episode, unspecified: Secondary | ICD-10-CM

## 2018-01-06 MED ORDER — FUROSEMIDE 40 MG PO TABS: 40.0000 mg | ORAL_TABLET | Freq: Two times a day (BID) | ORAL | 5 refills | Status: DC

## 2018-01-06 MED ORDER — SERTRALINE HCL 25 MG PO TABS: 25.0000 mg | ORAL_TABLET | Freq: Every day | ORAL | 5 refills | Status: DC

## 2018-01-06 MED ORDER — DICLOFENAC SODIUM 1 % TD GEL: TRANSDERMAL | 2 refills | Status: DC

## 2018-01-06 NOTE — Assessment & Plan Note (Addendum)
H/o cataracts and reduced vision refer to opthalmology

## 2018-01-06 NOTE — Patient Instructions (Addendum)
Wellness with nurse to be scheduled before end of the year it is due  MD follow up in 5 months, call if you need me before  Please schedule mammogram at checkout , pt has breast cancer history so please schedule at [facility] on appropriate day  Please call her with apt for eye exam on [address] or ask her to stop by and get the appoint info when she returns in am for fasting labs , very difficult to contact her on the telephone  Fasting CBC, lipid, cmp and EGFR, tSH and vit D in am  We will locate report of cologaurd  Please work on weight loss you are doing well! 

## 2018-01-12 ENCOUNTER — Ambulatory Visit (HOSPITAL_COMMUNITY)
Admission: RE | Admit: 2018-01-12 | Discharge: 2018-01-12 | Disposition: A | Discharge status: Home / Self Care | Payer: Medicare Other | Source: Ambulatory Visit | Attending: Family Medicine | Admitting: Family Medicine

## 2018-01-12 ENCOUNTER — Other Ambulatory Visit: Payer: Self-pay | Admitting: Family Medicine

## 2018-01-12 DIAGNOSIS — Z1231 Encounter for screening mammogram for malignant neoplasm of breast: Secondary | ICD-10-CM | POA: Diagnosis not present

## 2018-01-12 DIAGNOSIS — R921 Mammographic calcification found on diagnostic imaging of breast: Secondary | ICD-10-CM

## 2018-01-13 LAB — LIPID PANEL
CHOLESTEROL: 209 mg/dL — AB (ref <200)
HDL: 82 mg/dL (ref >50)
LDL CHOLESTEROL (CALC): 113 mg/dL — AB
Non-HDL Cholesterol (Calc): 127 mg/dL (calc) (ref <130)
TRIGLYCERIDES: 59 mg/dL (ref <150)
Total CHOL/HDL Ratio: 2.5 (calc) (ref <5.0)

## 2018-01-13 LAB — COMPLETE METABOLIC PANEL WITH GFR
AG Ratio: 1.1 (calc) (ref 1.0–2.5)
ALBUMIN MSPROF: 4.2 g/dL (ref 3.6–5.1)
ALKALINE PHOSPHATASE (APISO): 92 U/L (ref 33–130)
ALT: 29 U/L (ref 6–29)
AST: 27 U/L (ref 10–35)
BUN: 10 mg/dL (ref 7–25)
CO2: 25 mmol/L (ref 20–32)
CREATININE: 0.67 mg/dL (ref 0.60–0.93)
Calcium: 9.8 mg/dL (ref 8.6–10.4)
Chloride: 101 mmol/L (ref 98–110)
GFR, Est African American: 103 mL/min/{1.73_m2} (ref >=60)
GFR, Est Non African American: 89 mL/min/{1.73_m2} (ref >=60)
GLOBULIN: 3.7 g/dL (ref 1.9–3.7)
Glucose, Bld: 88 mg/dL (ref 65–99)
Potassium: 3.5 mmol/L (ref 3.5–5.3)
SODIUM: 140 mmol/L (ref 135–146)
Total Bilirubin: 1.7 mg/dL — ABNORMAL HIGH (ref 0.2–1.2)
Total Protein: 7.9 g/dL (ref 6.1–8.1)

## 2018-01-13 LAB — TSH: TSH: 2.68 m[IU]/L (ref 0.40–4.50)

## 2018-01-13 LAB — VITAMIN D 25 HYDROXY (VIT D DEFICIENCY, FRACTURES): Vit D, 25-Hydroxy: 23 ng/mL — ABNORMAL LOW (ref 30–100)

## 2018-01-13 LAB — CBC
HCT: 40.5 % (ref 35.0–45.0)
HEMOGLOBIN: 12.9 g/dL (ref 11.7–15.5)
MCH: 25.2 pg — ABNORMAL LOW (ref 27.0–33.0)
MCHC: 31.9 g/dL — AB (ref 32.0–36.0)
MCV: 79.3 fL — AB (ref 80.0–100.0)
MPV: 9.6 fL (ref 7.5–12.5)
Platelets: 262 10*3/uL (ref 140–400)
RBC: 5.11 10*6/uL — AB (ref 3.80–5.10)
RDW: 13.8 % (ref 11.0–15.0)
WBC: 7.5 10*3/uL (ref 3.8–10.8)

## 2018-01-14 ENCOUNTER — Telehealth: Payer: Self-pay | Admitting: Family Medicine

## 2018-01-14 NOTE — Telephone Encounter (Signed)
Patient called and left message returning your call, her phone was messing up & she couldn't hear you 336/ [phone]

## 2018-01-14 NOTE — Telephone Encounter (Signed)
Returned patients call on the number she left. No answer, no vm. Will try again later.

## 2018-01-16 ENCOUNTER — Encounter: Payer: Self-pay | Admitting: Family Medicine

## 2018-01-16 NOTE — Assessment & Plan Note (Signed)
Deteriorated. Patient re-educated about  the importance of commitment to a  minimum of 150 minutes of exercise per week.  The importance of healthy food choices with portion control discussed. Encouraged to start a food diary, count calories and to consider  joining a support group. Sample diet sheets offered. Goals set by the patient for the next several months.   Weight /BMI 01/06/2018 10/02/2017 07/16/2017  WEIGHT 250 lb 247 lb 251 lb  HEIGHT 5\' 5"  5\' 5"  5\' 5"   BMI 41.6 kg/m2 41.1 kg/m2 41.77 kg/m2

## 2018-01-16 NOTE — Assessment & Plan Note (Signed)
Improving but takes medication unreliably, encouraged to take on regular schedule

## 2018-01-16 NOTE — Assessment & Plan Note (Signed)
Controlled and stable , no change in management

## 2018-01-16 NOTE — Progress Notes (Signed)
   Angela Chase     MRN: [id number]      DOB: 1945-11-12   HPI Angela Chase is here for follow up and re-evaluation of chronic medical conditions, medication management and review of any available recent lab and radiology data.  Preventive health is updated, specifically  Cancer screening and Immunization.   Questions or concerns regarding consultations or procedures which the PT has had in the interim are  addressed. The PT denies any adverse reactions to current medications since the last visit.  There are no new concerns.  There are no specific complaints   ROS Denies recent fever or chills. Denies sinus pressure, nasal congestion, ear pain or sore throat. Denies chest congestion, productive cough or wheezing. Denies chest pains, palpitations and leg swelling Denies abdominal pain, nausea, vomiting,diarrhea or constipation.   Denies dysuria, frequency, hesitancy or incontinence.  Denies headaches, seizures, numbness, or tingling. Denies depression, anxiety or insomnia. Denies skin break down or rash.   PE  BP 120/80   Pulse 80   Resp 16   Ht 5\' 5"  (1.651 m)   Wt 250 lb (113.4 kg)   SpO2 98%   BMI 41.60 kg/m   Patient alert and oriented and in no cardiopulmonary distress.  HEENT: No facial asymmetry, EOMI,   oropharynx pink and moist.  Neck supple no JVD, no mass.  Chest: Clear to auscultation bilaterally.  CVS: S1, S2 no murmurs, no S3.Regular rate.  ABD: Soft non tender.   Ext: No edema  GY:BWLSLHTDS  ROM spine, shoulders, hips and markedly reduced in  knees.  Skin: Intact, no ulcerations or rash noted.  Psych: Good eye contact, normal affect. Memory intact not anxious or depressed appearing.  CNS: CN 2-12 intact, power,  normal throughout.no focal deficits noted.   Assessment & Plan  Vision loss, bilateral H/o cataracts and reduced vision refer to opthalmology  Intrinsic asthma Controlled and stable , no change in management  Bilateral leg  edema Improved , needs to take furosemide and potassium daily  Morbid obesity with BMI of 40.0-44.9, adult Deteriorated. Patient re-educated about  the importance of commitment to a  minimum of 150 minutes of exercise per week.  The importance of healthy food choices with portion control discussed. Encouraged to start a food diary, count calories and to consider  joining a support group. Sample diet sheets offered. Goals set by the patient for the next several months.   Weight /BMI 01/06/2018 10/02/2017 07/16/2017  WEIGHT 250 lb 247 lb 251 lb  HEIGHT 5\' 5"  5\' 5"  5\' 5"   BMI 41.6 kg/m2 41.1 kg/m2 41.77 kg/m2      Osteoarthritis of both knees Severe arthritis with buckling and near falls, currently being evaluated by ortho for possible surgery  Depression Improving but takes medication unreliably, encouraged to take on regular schedule

## 2018-01-16 NOTE — Assessment & Plan Note (Signed)
Severe arthritis with buckling and near falls, currently being evaluated by ortho for possible surgery

## 2018-01-16 NOTE — Assessment & Plan Note (Signed)
Improved , needs to take furosemide and potassium daily

## 2018-01-21 NOTE — Telephone Encounter (Signed)
Called patient. No answer. Left vm. Mailed letter with results and Dr.Simpson's remarks.

## 2018-01-23 ENCOUNTER — Encounter (HOSPITAL_COMMUNITY): Payer: Self-pay | Admitting: Emergency Medicine

## 2018-01-23 ENCOUNTER — Other Ambulatory Visit: Payer: Self-pay

## 2018-01-23 ENCOUNTER — Emergency Department (HOSPITAL_COMMUNITY): Payer: Medicare Other

## 2018-01-23 ENCOUNTER — Telehealth: Payer: Self-pay | Admitting: Family Medicine

## 2018-01-23 ENCOUNTER — Emergency Department (HOSPITAL_COMMUNITY)
Admission: EM | Admit: 2018-01-23 | Discharge: 2018-01-23 | Disposition: A | Discharge status: Home / Self Care | Payer: Medicare Other | Attending: Emergency Medicine | Admitting: Emergency Medicine

## 2018-01-23 DIAGNOSIS — M25511 Pain in right shoulder: Secondary | ICD-10-CM | POA: Insufficient documentation

## 2018-01-23 DIAGNOSIS — Z79899 Other long term (current) drug therapy: Secondary | ICD-10-CM | POA: Insufficient documentation

## 2018-01-23 DIAGNOSIS — Y929 Unspecified place or not applicable: Secondary | ICD-10-CM | POA: Diagnosis not present

## 2018-01-23 DIAGNOSIS — R51 Headache: Secondary | ICD-10-CM | POA: Insufficient documentation

## 2018-01-23 DIAGNOSIS — W19XXXA Unspecified fall, initial encounter: Secondary | ICD-10-CM

## 2018-01-23 DIAGNOSIS — S0990XA Unspecified injury of head, initial encounter: Secondary | ICD-10-CM

## 2018-01-23 DIAGNOSIS — Z7982 Long term (current) use of aspirin: Secondary | ICD-10-CM | POA: Diagnosis not present

## 2018-01-23 DIAGNOSIS — W109XXA Fall (on) (from) unspecified stairs and steps, initial encounter: Secondary | ICD-10-CM | POA: Diagnosis not present

## 2018-01-23 DIAGNOSIS — J45909 Unspecified asthma, uncomplicated: Secondary | ICD-10-CM | POA: Diagnosis not present

## 2018-01-23 DIAGNOSIS — Y999 Unspecified external cause status: Secondary | ICD-10-CM | POA: Diagnosis not present

## 2018-01-23 DIAGNOSIS — Y9301 Activity, walking, marching and hiking: Secondary | ICD-10-CM | POA: Diagnosis not present

## 2018-01-23 LAB — BASIC METABOLIC PANEL
Anion gap: 6 (ref 5–15)
BUN: 12 mg/dL (ref 8–23)
CALCIUM: 9.1 mg/dL (ref 8.9–10.3)
CO2: 29 mmol/L (ref 22–32)
Chloride: 104 mmol/L (ref 98–111)
Creatinine, Ser: 0.71 mg/dL (ref 0.44–1.00)
GFR calc Af Amer: >60 mL/min (ref >60)
GLUCOSE: 92 mg/dL (ref 70–99)
Potassium: 3.6 mmol/L (ref 3.5–5.1)
SODIUM: 139 mmol/L (ref 135–145)

## 2018-01-23 LAB — CBC WITH DIFFERENTIAL/PLATELET
BASOS ABS: 0 10*3/uL (ref 0.0–0.1)
Basophils Relative: 0 %
EOS ABS: 0.1 10*3/uL (ref 0.0–0.7)
EOS PCT: 2 %
HCT: 36.1 % (ref 36.0–46.0)
Hemoglobin: 12.4 g/dL (ref 12.0–15.0)
LYMPHS ABS: 1.2 10*3/uL (ref 0.7–4.0)
LYMPHS PCT: 19 %
MCH: 26.5 pg (ref 26.0–34.0)
MCHC: 34.3 g/dL (ref 30.0–36.0)
MCV: 77.1 fL — AB (ref 78.0–100.0)
MONO ABS: 0.8 10*3/uL (ref 0.1–1.0)
Monocytes Relative: 12 %
Neutro Abs: 4.2 10*3/uL (ref 1.7–7.7)
Neutrophils Relative %: 67 %
PLATELETS: 222 10*3/uL (ref 150–400)
RBC: 4.68 MIL/uL (ref 3.87–5.11)
RDW: 14.8 % (ref 11.5–15.5)
WBC: 6.4 10*3/uL (ref 4.0–10.5)

## 2018-01-23 LAB — URINALYSIS, ROUTINE W REFLEX MICROSCOPIC
BILIRUBIN URINE: NEGATIVE
Glucose, UA: NEGATIVE mg/dL
HGB URINE DIPSTICK: NEGATIVE
KETONES UR: NEGATIVE mg/dL
Leukocytes, UA: NEGATIVE
Nitrite: NEGATIVE
PROTEIN: NEGATIVE mg/dL
Specific Gravity, Urine: 1.009 (ref 1.005–1.030)
pH: 6 (ref 5.0–8.0)

## 2018-01-23 MED ORDER — SODIUM CHLORIDE 0.9 % IV BOLUS
500.0000 mL | Freq: Once | INTRAVENOUS | Status: AC
  Administered 2018-01-23: 500 mL via INTRAVENOUS

## 2018-01-23 NOTE — ED Triage Notes (Signed)
Pt fell down 5 steps backwards hit back of head and rt shoulder .  Pt states she may have passed out, unsure what happened.

## 2018-01-23 NOTE — Telephone Encounter (Signed)
PT LVM that she fell this AM and wanted to be seen. I called PT back , and advised her that she would need to be seen in the ER ad Dr Moshe Cipro was not available today.

## 2018-01-23 NOTE — ED Provider Notes (Signed)
[facility] EMERGENCY DEPARTMENT Provider Note   CSN: [id number] Arrival date & time: 01/23/18  1101     History   Chief Complaint Chief Complaint  Patient presents with  . Fall    HPI MASSIAH LONGANECKER is a 72 y.o. female.  Level 5 caveat for urgent need for intervention.  Patient was walking down the stairs with her laundry when she possibly "passed out".  She slid down the final 5 steps on her back.  She now complains of pain in her occipital area and right shoulder.  No chest pain, dyspnea, neurological deficits, prodromal illnesses.  Daughter reports normal behavior in the emergency department.     Past Medical History:  Diagnosis Date  . Anxiety   . Asthma 2010  . Depression   . GERD (gastroesophageal reflux disease) 1995  . Hoarseness   . Mixed hyperlipidemia   . Obstructive sleep apnea    On CPAP  . Osteoarthritis   . Wears glasses     Patient Active Problem List   Diagnosis Date Noted  . Vision loss, bilateral 01/06/2018  . Social isolation 10/04/2017  . Falls, sequela 11/30/2016  . Pruritus 11/09/2015  . Seasonal allergies 08/30/2015  . Bilateral leg edema 06/04/2015  . Recurrent falls while walking 02/01/2015  . Urinary incontinence 04/01/2014  . Vitamin D deficiency 04/01/2014  . 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 Unspecified vitamin D deficiency 06/18/2013  . Depression 06/18/2013  . Osteoarthritis of both knees 06/18/2013  . Morbid obesity with BMI of 40.0-44.9, adult ([place]) 06/18/2013  . Insomnia 06/18/2013  . Shoulder pain, right 06/17/2013  . OSA (obstructive sleep apnea) 09/26/2011  . Hyperlipemia 05/25/2007    Past Surgical History:  Procedure Laterality Date  . BREAST BIOPSY Right    Benign  . CHOLECYSTECTOMY  1985  . ROTATOR CUFF REPAIR Right 2005     OB History   None      Home Medications    Prior to Admission medications   Medication Sig Start Date End Date Taking? Authorizing Provider  albuterol (PROVENTIL  HFA;VENTOLIN HFA) 108 (90 Base) MCG/ACT inhaler Inhale 2 puffs into the lungs every 6 (six) hours as needed for wheezing or shortness of breath. 05/27/16  Yes Raylene Everts, MD  antiseptic oral rinse (BIOTENE) LIQD 15 mLs by Mouth Rinse route as needed for dry mouth.   Yes [provider]  Artificial Tear Ointment (DRY EYES OP) Place 1 drop into both eyes daily.   Yes [provider]  aspirin 81 MG tablet Take 81 mg by mouth daily.   Yes [provider]  clobetasol cream (TEMOVATE) 0.05 % Apply sparingly twice daily to rash on upper extremities for itch 10/04/17  Yes Fayrene Helper, MD  diclofenac sodium (VOLTAREN) 1 % GEL Apply twice daily to each knee 01/06/18  Yes Fayrene Helper, MD  Fluticasone-Salmeterol (ADVAIR) 100-50 MCG/DOSE AEPB Inhale 1 puff into the lungs 2 (two) times daily. 10/02/17  Yes Fayrene Helper, MD  furosemide (LASIX) 40 MG tablet Take 1 tablet (40 mg total) by mouth 2 (two) times daily. Patient taking differently: Take 80 mg by mouth daily.  01/06/18  Yes Fayrene Helper, MD  HYDROcodone-acetaminophen (NORCO/VICODIN) 5-325 MG tablet Take 1 tablet by mouth daily as needed for moderate pain.    Yes [provider]  hydrOXYzine (ATARAX/VISTARIL) 10 MG tablet Take 1 tablet (10 mg total) by mouth 3 (three) times daily as needed. 10/02/17  Yes Fayrene Helper, MD  potassium chloride SA (K-DUR,KLOR-CON) 20 MEQ tablet Take 1 tablet (20 mEq total) by mouth 3 (three) times daily. 10/04/17  Yes Fayrene Helper, MD  Vitamin D, Ergocalciferol, (DRISDOL) 50000 units CAPS capsule TAKE 1 CAPSULE BY MOUTH EVERY 7 DAYS 07/16/17  Yes Fayrene Helper, MD  acetaminophen (TYLENOL) 500 MG tablet Take 500 mg by mouth every 6 (six) hours as needed.    [provider]  albuterol (PROVENTIL) (2.5 MG/3ML) 0.083% nebulizer solution Take 3 mLs (2.5 mg total) by nebulization every 6 (six) hours as needed for wheezing or shortness of  breath. Patient not taking: Reported on 01/23/2018 03/28/15   Fayrene Helper, MD  ipratropium (ATROVENT) 0.02 % nebulizer solution Take 2.5 mLs (0.5 mg total) by nebulization every 6 (six) hours as needed for wheezing or shortness of breath. Use with albuterol solution Patient not taking: Reported on 01/23/2018 03/28/15   Fayrene Helper, MD  montelukast (SINGULAIR) 10 MG tablet Take 1 tablet (10 mg total) by mouth at bedtime. Patient not taking: Reported on 01/23/2018 11/11/16   Fayrene Helper, MD  sertraline (ZOLOFT) 25 MG tablet Take 1 tablet (25 mg total) by mouth daily. Patient not taking: Reported on 01/06/2018 01/06/18   Fayrene Helper, MD    Family History Family History  Problem Relation Age of Onset  . Cancer Father   . Cancer Mother   . Heart attack Mother   . Cancer Brother     Social History Social History   Tobacco Use  . Smoking status: Never Smoker  . Smokeless tobacco: Never Used  Substance Use Topics  . Alcohol use: No  . Drug use: No     Allergies   Cephalexin; Omeprazole; and Rofecoxib   Review of Systems Review of Systems  Unable to perform ROS: Acuity of condition     Physical Exam Updated Vital Signs BP (!) 124/97   Pulse 78   Temp 98 F (36.7 C) (Oral)   Resp 15   Ht 5\' 6"  (1.676 m)   Wt 113.4 kg   SpO2 100%   BMI 40.35 kg/m   Physical Exam  Constitutional: She is oriented to person, place, and time. She appears well-developed and well-nourished.  HENT:  Head: Normocephalic.  Tender occipital area.  Eyes: Conjunctivae are normal.  Neck: Neck supple.  Cardiovascular: Normal rate and regular rhythm.  Pulmonary/Chest: Effort normal and breath sounds normal.  Abdominal: Soft. Bowel sounds are normal.  Musculoskeletal:  Tender posterior right shoulder  Neurological: She is alert and oriented to person, place, and time.  Skin: Skin is warm and dry.  Psychiatric: She has a normal mood and affect. Her behavior is normal.   Nursing note and vitals reviewed.    ED Treatments / Results  Labs (all labs ordered are listed, but only abnormal results are displayed) Labs Reviewed  CBC WITH DIFFERENTIAL/PLATELET - Abnormal; Notable for the following components:      Result Value   MCV 77.1 (*)    All other components within normal limits  BASIC METABOLIC PANEL  URINALYSIS, ROUTINE W REFLEX MICROSCOPIC    EKG EKG Interpretation  Date/Time:  Friday January 23 2018 11:39:32 EDT Ventricular Rate:  73 PR Interval:    QRS Duration: 93 QT Interval:  407 QTC Calculation: 449 R Axis:   9 Text Interpretation:  Sinus rhythm Confirmed by Nat Christen [phone]) on 01/23/2018 12:19:08 PM   Radiology Dg Shoulder Right  Result Date: 01/23/2018 CLINICAL DATA:  Fall down  stairs with right shoulder pain, initial encounter EXAM: RIGHT SHOULDER - 2+ VIEW COMPARISON:  12/17/2014 FINDINGS: Mild degenerative changes of the acromioclavicular joint are seen. Some downward angulation of the acromion is noted likely contributing to some impingement. No acute fracture or dislocation is noted. No soft tissue changes are seen. IMPRESSION: Degenerative changes without acute abnormality. Electronically Signed   By: Inez Catalina M.D.   On: 01/23/2018 13:24   Ct Head Wo Contrast  Result Date: 01/23/2018 CLINICAL DATA:  [place] backwards down 5 steps and hit the back of her head. Possible loss of consciousness. EXAM: CT HEAD WITHOUT CONTRAST CT CERVICAL SPINE WITHOUT CONTRAST TECHNIQUE: Multidetector CT imaging of the head and cervical spine was performed following the standard protocol without intravenous contrast. Multiplanar CT image reconstructions of the cervical spine were also generated. COMPARISON:  12/17/2014. FINDINGS: CT HEAD FINDINGS Brain: The subarachnoid spaces remain mildly prominent with normal size and position of the ventricles. Mild patchy white matter low density in both cerebral hemispheres with mild progression. No  intracranial hemorrhage, mass lesion or CT evidence of acute infarction. Vascular: No hyperdense vessel or unexpected calcification. Skull: Normal. Negative for fracture or focal lesion. Sinuses/Orbits: Unremarkable. Other: None. CT CERVICAL SPINE FINDINGS Alignment: Mild reversal of the upper cervical lordosis without significant change. No subluxations. Skull base and vertebrae: No acute fracture. No primary bone lesion or focal pathologic process. Soft tissues and spinal canal: No prevertebral fluid or swelling. No visible canal hematoma. Disc levels: Stable multilevel degenerative changes. These include a moderate-sized broad-based posterior disc protrusion at the C3-4 level, causing moderate canal stenosis. Spurs are also causing moderate canal stenosis at the C4-5 level and C5-6 level bilateral foraminal stenosis at those levels. Upper chest: Clear lung apices. Other: None. IMPRESSION: 1. No skull fracture or intracranial hemorrhage. 2. No cervical spine fracture or subluxation. 3. Stable mild diffuse cerebral and cerebellar atrophy. 4. Mildly progressive chronic small vessel white matter ischemic changes in both cerebral hemispheres. 5. Stable cervical spine degenerative changes, as described above. Electronically Signed   By: Claudie Revering M.D.   On: 01/23/2018 13:18   Ct Cervical Spine Wo Contrast  Result Date: 01/23/2018 CLINICAL DATA:  [place] backwards down 5 steps and hit the back of her head. Possible loss of consciousness. EXAM: CT HEAD WITHOUT CONTRAST CT CERVICAL SPINE WITHOUT CONTRAST TECHNIQUE: Multidetector CT imaging of the head and cervical spine was performed following the standard protocol without intravenous contrast. Multiplanar CT image reconstructions of the cervical spine were also generated. COMPARISON:  12/17/2014. FINDINGS: CT HEAD FINDINGS Brain: The subarachnoid spaces remain mildly prominent with normal size and position of the ventricles. Mild patchy white matter low density in  both cerebral hemispheres with mild progression. No intracranial hemorrhage, mass lesion or CT evidence of acute infarction. Vascular: No hyperdense vessel or unexpected calcification. Skull: Normal. Negative for fracture or focal lesion. Sinuses/Orbits: Unremarkable. Other: None. CT CERVICAL SPINE FINDINGS Alignment: Mild reversal of the upper cervical lordosis without significant change. No subluxations. Skull base and vertebrae: No acute fracture. No primary bone lesion or focal pathologic process. Soft tissues and spinal canal: No prevertebral fluid or swelling. No visible canal hematoma. Disc levels: Stable multilevel degenerative changes. These include a moderate-sized broad-based posterior disc protrusion at the C3-4 level, causing moderate canal stenosis. Spurs are also causing moderate canal stenosis at the C4-5 level and C5-6 level bilateral foraminal stenosis at those levels. Upper chest: Clear lung apices. Other: None. IMPRESSION: 1. No skull fracture or intracranial  hemorrhage. 2. No cervical spine fracture or subluxation. 3. Stable mild diffuse cerebral and cerebellar atrophy. 4. Mildly progressive chronic small vessel white matter ischemic changes in both cerebral hemispheres. 5. Stable cervical spine degenerative changes, as described above. Electronically Signed   By: Claudie Revering M.D.   On: 01/23/2018 13:18    Procedures Procedures (including critical care time)  Medications Ordered in ED Medications  sodium chloride 0.9 % bolus 500 mL ( Intravenous Stopped 01/23/18 1240)     Initial Impression / Assessment and Plan / ED Course  I have reviewed the triage vital signs and the nursing notes.  Pertinent labs & imaging results that were available during my care of the patient were reviewed by me and considered in my medical decision making (see chart for details).     Patient presents with a possible syncopal spell and fall down several steps.  She is alert and oriented x3 in the  emergency department without neurological deficits.  Hemoglobin and glucose stable.  EKG normal sinus rhythm.  CT head, CT cervical spine, plain films of right shoulder all negative.  Patient is stable for discharge.  Final Clinical Impressions(s) / ED Diagnoses   Final diagnoses:  Fall, initial encounter  Minor head injury, initial encounter  Acute pain of right shoulder    ED Discharge Orders    None       Nat Christen, MD 01/23/18 1527

## 2018-01-23 NOTE — ED Notes (Signed)
C/o pain to top of head 8/10.

## 2018-01-23 NOTE — Telephone Encounter (Signed)
Called patient to check on her. No answer. Left vm letting her know if she has any dizziness, light headiness, sob, chest pain, or other symptoms she does need to be evaluated at the closest ED or urgent care.

## 2018-01-23 NOTE — Discharge Instructions (Addendum)
Test showed no life-threatening condition.  He will be sore for several days.  Follow-up with your primary care doctor.

## 2018-01-26 ENCOUNTER — Ambulatory Visit (INDEPENDENT_AMBULATORY_CARE_PROVIDER_SITE_OTHER): Payer: Medicare Other

## 2018-01-26 VITALS — BP 118/78 | HR 77 | Resp 16 | Ht 65.0 in | Wt 251.0 lb

## 2018-01-26 DIAGNOSIS — R296 Repeated falls: Secondary | ICD-10-CM | POA: Diagnosis not present

## 2018-01-26 DIAGNOSIS — Z0000 Encounter for general adult medical examination without abnormal findings: Secondary | ICD-10-CM | POA: Diagnosis not present

## 2018-01-26 DIAGNOSIS — Z13820 Encounter for screening for osteoporosis: Secondary | ICD-10-CM | POA: Diagnosis not present

## 2018-01-26 DIAGNOSIS — Z780 Asymptomatic menopausal state: Secondary | ICD-10-CM | POA: Diagnosis not present

## 2018-01-26 NOTE — Telephone Encounter (Signed)
pls call and work in this week as follow up from ED

## 2018-01-26 NOTE — Patient Instructions (Signed)
[name] , Thank you for taking time to come for your Medicare Wellness Visit. I appreciate your ongoing commitment to your health goals. Please review the following plan we discussed and let me know if I can assist you in the future.   Schedule bone density at checkout   Schedule annual wellness in 1 year   Screening recommendations/referrals: Colonoscopy:  Mammogram: up to date  Bone Density: ordered  Recommended yearly ophthalmology/optometry visit for glaucoma screening and checkup Recommended yearly dental visit for hygiene and checkup  Vaccinations: Influenza vaccine:up to date  Pneumococcal vaccine: up to date  Tdap vaccine: up to date  Shingles vaccine: ask insurance if covered     Advanced directives: done    Conditions/risks identified: Done   Next appointment: wellness in 1 year    Preventive Care 71 Years and Older, Female Preventive care refers to lifestyle choices and visits with your health care provider that can promote health and wellness. What does preventive care include?  A yearly physical exam. This is also called an annual well check.  Dental exams once or twice a year.  Routine eye exams. Ask your health care provider how often you should have your eyes checked.  Personal lifestyle choices, including:  Daily care of your teeth and gums.  Regular physical activity.  Eating a healthy diet.  Avoiding tobacco and drug use.  Limiting alcohol use.  Practicing safe sex.  Taking low-dose aspirin every day.  Taking vitamin and mineral supplements as recommended by your health care provider. What happens during an annual well check? The services and screenings done by your health care provider during your annual well check will depend on your age, overall health, lifestyle risk factors, and family history of disease. Counseling  Your health care provider may ask you questions about your:  Alcohol use.  Tobacco use.  Drug  use.  Emotional well-being.  Home and relationship well-being.  Sexual activity.  Eating habits.  History of falls.  Memory and ability to understand (cognition).  Work and work Statistician.  Reproductive health. Screening  You may have the following tests or measurements:  Height, weight, and BMI.  Blood pressure.  Lipid and cholesterol levels. These may be checked every 5 years, or more frequently if you are over 50 years old.  Skin check.  Lung cancer screening. You may have this screening every year starting at age 30 if you have a 30-pack-year history of smoking and currently smoke or have quit within the past 15 years.  Fecal occult blood test (FOBT) of the stool. You may have this test every year starting at age 41.  Flexible sigmoidoscopy or colonoscopy. You may have a sigmoidoscopy every 5 years or a colonoscopy every 10 years starting at age 34.  Hepatitis C blood test.  Hepatitis B blood test.  Sexually transmitted disease (STD) testing.  Diabetes screening. This is done by checking your blood sugar (glucose) after you have not eaten for a while (fasting). You may have this done every 1-3 years.  Bone density scan. This is done to screen for osteoporosis. You may have this done starting at age 71.  Mammogram. This may be done every 1-2 years. Talk to your health care provider about how often you should have regular mammograms. Talk with your health care provider about your test results, treatment options, and if necessary, the need for more tests. Vaccines  Your health care provider may recommend certain vaccines, such as:  Influenza vaccine. This is  recommended every year.  Tetanus, diphtheria, and acellular pertussis (Tdap, Td) vaccine. You may need a Td booster every 10 years.  Zoster vaccine. You may need this after age 71.  Pneumococcal 13-valent conjugate (PCV13) vaccine. One dose is recommended after age 8.  Pneumococcal polysaccharide  (PPSV23) vaccine. One dose is recommended after age 39. Talk to your health care provider about which screenings and vaccines you need and how often you need them. This information is not intended to replace advice given to you by your health care provider. Make sure you discuss any questions you have with your health care provider. Document Released: 05/12/2015 Document Revised: 01/03/2016 Document Reviewed: 02/14/2015 Elsevier Interactive Patient Education  2017 [place] Prevention in the Home Falls can cause injuries. They can happen to people of all ages. There are many things you can do to make your home safe and to help prevent falls. What can I do on the outside of my home?  Regularly fix the edges of walkways and driveways and fix any cracks.  Remove anything that might make you trip as you walk through a door, such as a raised step or threshold.  Trim any bushes or trees on the path to your home.  Use bright outdoor lighting.  Clear any walking paths of anything that might make someone trip, such as rocks or tools.  Regularly check to see if handrails are loose or broken. Make sure that both sides of any steps have handrails.  Any raised decks and porches should have guardrails on the edges.  Have any leaves, snow, or ice cleared regularly.  Use sand or salt on walking paths during winter.  Clean up any spills in your garage right away. This includes oil or grease spills. What can I do in the bathroom?  Use night lights.  Install grab bars by the toilet and in the tub and shower. Do not use towel bars as grab bars.  Use non-skid mats or decals in the tub or shower.  If you need to sit down in the shower, use a plastic, non-slip stool.  Keep the floor dry. Clean up any water that spills on the floor as soon as it happens.  Remove soap buildup in the tub or shower regularly.  Attach bath mats securely with double-sided non-slip rug tape.  Do not have  throw rugs and other things on the floor that can make you trip. What can I do in the bedroom?  Use night lights.  Make sure that you have a light by your bed that is easy to reach.  Do not use any sheets or blankets that are too big for your bed. They should not hang down onto the floor.  Have a firm chair that has side arms. You can use this for support while you get dressed.  Do not have throw rugs and other things on the floor that can make you trip. What can I do in the kitchen?  Clean up any spills right away.  Avoid walking on wet floors.  Keep items that you use a lot in easy-to-reach places.  If you need to reach something above you, use a strong step stool that has a grab bar.  Keep electrical cords out of the way.  Do not use floor polish or wax that makes floors slippery. If you must use wax, use non-skid floor wax.  Do not have throw rugs and other things on the floor that can make you trip.  What can I do with my stairs?  Do not leave any items on the stairs.  Make sure that there are handrails on both sides of the stairs and use them. Fix handrails that are broken or loose. Make sure that handrails are as long as the stairways.  Check any carpeting to make sure that it is firmly attached to the stairs. Fix any carpet that is loose or worn.  Avoid having throw rugs at the top or bottom of the stairs. If you do have throw rugs, attach them to the floor with carpet tape.  Make sure that you have a light switch at the top of the stairs and the bottom of the stairs. If you do not have them, ask someone to add them for you. What else can I do to help prevent falls?  Wear shoes that:  Do not have high heels.  Have rubber bottoms.  Are comfortable and fit you well.  Are closed at the toe. Do not wear sandals.  If you use a stepladder:  Make sure that it is fully opened. Do not climb a closed stepladder.  Make sure that both sides of the stepladder are  locked into place.  Ask someone to hold it for you, if possible.  Clearly mark and make sure that you can see:  Any grab bars or handrails.  First and last steps.  Where the edge of each step is.  Use tools that help you move around (mobility aids) if they are needed. These include:  Canes.  Walkers.  Scooters.  Crutches.  Turn on the lights when you go into a dark area. Replace any light bulbs as soon as they burn out.  Set up your furniture so you have a clear path. Avoid moving your furniture around.  If any of your floors are uneven, fix them.  If there are any pets around you, be aware of where they are.  Review your medicines with your doctor. Some medicines can make you feel dizzy. This can increase your chance of falling. Ask your doctor what other things that you can do to help prevent falls. This information is not intended to replace advice given to you by your health care provider. Make sure you discuss any questions you have with your health care provider. Document Released: 02/09/2009 Document Revised: 09/21/2015 Document Reviewed: 05/20/2014 Elsevier Interactive Patient Education  2017 [employer].

## 2018-01-26 NOTE — Telephone Encounter (Signed)
Pt is scheduled for tomorrow..thanks

## 2018-01-26 NOTE — Progress Notes (Signed)
Subjective:   Angela Chase is a 72 y.o. female who presents for Medicare Annual (Subsequent) preventive examination.  Review of Systems:   Cardiac Risk Factors include: advanced age (>13men, >34 women);dyslipidemia;obesity (BMI >30kg/m2);sedentary lifestyle     Objective:     Vitals: BP 118/78   Pulse 77   Resp 16   Ht 5\' 5"  (1.651 m)   Wt 251 lb (113.9 kg)   SpO2 94%   BMI 41.77 kg/m   Body mass index is 41.77 kg/m.  Advanced Directives 01/26/2018 01/23/2018 06/03/2016 12/17/2014 10/16/2014 02/22/2014 09/30/2013  Does Patient Have a Medical Advance Directive? Yes No No No No No Patient does not have advance directive;Patient would not like information  Does patient want to make changes to medical advance directive? Yes (ED - Information included in AVS) - - - - - -  Would patient like information on creating a medical advance directive? - - Yes (MAU/Ambulatory/Procedural Areas - Information given) No - patient declined information No - patient declined information - -    Tobacco Social History   Tobacco Use  Smoking Status Never Smoker  Smokeless Tobacco Never Used     Counseling given: Not Answered   Clinical Intake:  Pre-visit preparation completed: Yes  Pain : 0-10 Pain Score: 9  Pain Type: Acute pain Pain Location: Knee Pain Onset: In the past 7 days     Nutritional Status: BMI > 30  Obese Diabetes: No  How often do you need to have someone help you when you read instructions, pamphlets, or other written materials from your doctor or pharmacy?: 1 - Never What is the last grade level you completed in school?: Masters degree  Interpreter Needed?: No  Information entered by :: Chalet Kerwin LPN   Past Medical History:  Diagnosis Date  . Anxiety   . Asthma 2010  . Depression   . GERD (gastroesophageal reflux disease) 1995  . Hoarseness   . Mixed hyperlipidemia   . Obstructive sleep apnea    On CPAP  . Osteoarthritis   . Wears glasses    Past  Surgical History:  Procedure Laterality Date  . BREAST BIOPSY Right    Benign  . CHOLECYSTECTOMY  1985  . ROTATOR CUFF REPAIR Right 2005   Family History  Problem Relation Age of Onset  . Cancer Father   . Cancer Mother   . Heart attack Mother   . Cancer Brother    Social History   Socioeconomic History  . Marital status: Single    Spouse name: Not on file  . Number of children: Not on file  . Years of education: Not on file  . Highest education level: Not on file  Occupational History  . Occupation: retired  Scientific laboratory technician  . Financial resource strain: Somewhat hard  . Food insecurity:    Worry: Never true    Inability: Never true  . Transportation needs:    Medical: No    Non-medical: No  Tobacco Use  . Smoking status: Never Smoker  . Smokeless tobacco: Never Used  Substance and Sexual Activity  . Alcohol use: No  . Drug use: No  . Sexual activity: Not Currently  Lifestyle  . Physical activity:    Days per week: 0 days    Minutes per session: 0 min  . Stress: Not on file  Relationships  . Social connections:    Talks on phone: Twice a week    Gets together: Twice a week  Attends religious service: 1 to 4 times per year    Active member of club or organization: No    Attends meetings of clubs or organizations: Never    Relationship status: Divorced  Other Topics Concern  . Not on file  Social History Narrative  . Not on file    Outpatient Encounter Medications as of 01/26/2018  Medication Sig  . acetaminophen (TYLENOL) 500 MG tablet Take 500 mg by mouth every 6 (six) hours as needed.  Marland Kitchen albuterol (PROVENTIL HFA;VENTOLIN HFA) 108 (90 Base) MCG/ACT inhaler Inhale 2 puffs into the lungs every 6 (six) hours as needed for wheezing or shortness of breath.  Marland Kitchen albuterol (PROVENTIL) (2.5 MG/3ML) 0.083% nebulizer solution Take 3 mLs (2.5 mg total) by nebulization every 6 (six) hours as needed for wheezing or shortness of breath.  Marland Kitchen antiseptic oral rinse (BIOTENE)  LIQD 15 mLs by Mouth Rinse route as needed for dry mouth.  . Artificial Tear Ointment (DRY EYES OP) Place 1 drop into both eyes daily.  Marland Kitchen aspirin 81 MG tablet Take 81 mg by mouth daily.  . clobetasol cream (TEMOVATE) 0.05 % Apply sparingly twice daily to rash on upper extremities for itch  . diclofenac sodium (VOLTAREN) 1 % GEL Apply twice daily to each knee  . Fluticasone-Salmeterol (ADVAIR) 100-50 MCG/DOSE AEPB Inhale 1 puff into the lungs 2 (two) times daily.  . furosemide (LASIX) 40 MG tablet Take 1 tablet (40 mg total) by mouth 2 (two) times daily. (Patient taking differently: Take 80 mg by mouth daily. )  . HYDROcodone-acetaminophen (NORCO/VICODIN) 5-325 MG tablet Take 1 tablet by mouth daily as needed for moderate pain.   . hydrOXYzine (ATARAX/VISTARIL) 10 MG tablet Take 1 tablet (10 mg total) by mouth 3 (three) times daily as needed.  Marland Kitchen ipratropium (ATROVENT) 0.02 % nebulizer solution Take 2.5 mLs (0.5 mg total) by nebulization every 6 (six) hours as needed for wheezing or shortness of breath. Use with albuterol solution  . montelukast (SINGULAIR) 10 MG tablet Take 1 tablet (10 mg total) by mouth at bedtime.  . potassium chloride SA (K-DUR,KLOR-CON) 20 MEQ tablet Take 1 tablet (20 mEq total) by mouth 3 (three) times daily.  . sertraline (ZOLOFT) 25 MG tablet Take 1 tablet (25 mg total) by mouth daily.  . Vitamin D, Ergocalciferol, (DRISDOL) 50000 units CAPS capsule TAKE 1 CAPSULE BY MOUTH EVERY 7 DAYS   No facility-administered encounter medications on file as of 01/26/2018.     Activities of Daily Living In your present state of health, do you have any difficulty performing the following activities: 01/26/2018 01/26/2018  Hearing? N N  Vision? N N  Difficulty concentrating or making decisions? N N  Walking or climbing stairs? Y Y  Comment - mobility impaired   Dressing or bathing? N N  Doing errands, shopping? N N  Comment - mobility impaired   Preparing Food and eating ? N N    Using the Toilet? N N  In the past six months, have you accidently leaked urine? N N  Do you have problems with loss of bowel control? N N  Managing your Medications? N N  Managing your Finances? N N  Housekeeping or managing your Housekeeping? N N  Some recent data might be hidden    Patient Care Team: Fayrene Helper, MD as PCP - General (Family Medicine) Leta Baptist, MD as Consulting Physician (Otolaryngology) Neldon Mc, Donnamarie Poag, MD as Consulting Physician (Allergy and Immunology) Satira Sark, MD as Consulting Physician (  Cardiology)    Assessment:   This is a routine wellness examination for [place].  Exercise Activities and Dietary recommendations Current Exercise Habits: The patient does not participate in regular exercise at present, Exercise limited by: orthopedic condition(s);respiratory conditions(s)  Goals    . Exercise 3x per week (30 min per time)     Patient would like to start exercising three times a week for at least 30 minutes at a time.       Fall Risk Fall Risk  01/26/2018 01/06/2018 10/02/2017 07/16/2017 07/16/2017  Falls in the past year? Yes Yes Yes No No  Number falls in past yr: 2 or more 1 1 - -  Risk for fall due to : - - - - -  Risk for fall due to: Comment - - - - -   Is the patient's home free of loose throw rugs in walkways, pet beds, electrical cords, etc?   yes      Grab bars in the bathroom? yes      Handrails on the stairs?   yes      Adequate lighting?   yes  Timed Get Up and Go performed:   Depression Screen PHQ 2/9 Scores 01/26/2018 01/06/2018 10/02/2017 07/16/2017  PHQ - 2 Score 2 2 3 4   PHQ- 9 Score 6 5 12 14      Cognitive Function     6CIT Screen 01/26/2018 06/03/2016  What Year? 0 points 0 points  What month? 0 points 0 points  What time? 0 points 0 points  Count back from 20 0 points 0 points  Months in reverse 0 points 0 points  Repeat phrase 0 points 0 points  Total Score 0 0    Immunization History  Administered  Date(s) Administered  . Influenza Whole 02/26/2006  . Influenza,inj,Quad PF,6+ Mos 01/27/2013, 04/01/2014, 05/16/2015, 05/27/2016, 02/25/2017, 01/06/2018  . Pneumococcal Conjugate-13 07/26/2014  . Pneumococcal Polysaccharide-23 06/17/2013  . Td 12/07/2003  . Tdap 09/15/2014  . Zoster 06/17/2013    Qualifies for Shingles Vaccine?  Screening Tests Health Maintenance  Topic Date Due  . COLONOSCOPY  04/27/1997  . MAMMOGRAM  01/13/2020  . TETANUS/TDAP  09/14/2024  . INFLUENZA VACCINE  Completed  . DEXA SCAN  Completed  . Hepatitis C Screening  Completed  . PNA vac Low Risk Adult  Completed    Cancer Screenings: Lung: Low Dose CT Chest recommended if Age 66-80 years, 30 pack-year currently smoking OR have quit w/in 15years. Patient does not qualify. Breast:  Up to date on Mammogram? Yes   Up to date of Bone Density/Dexa? No ordered Colorectal: up to date   Additional Screenings:  Hepatitis C Screening:      Plan:     I have personally reviewed and noted the following in the patient's chart:   . Medical and social history . Use of alcohol, tobacco or illicit drugs  . Current medications and supplements . Functional ability and status . Nutritional status . Physical activity . Advanced directives . List of other physicians . Hospitalizations, surgeries, and ER visits in previous 12 months . Vitals . Screenings to include cognitive, depression, and falls . Referrals and appointments  In addition, I have reviewed and discussed with patient certain preventive protocols, quality metrics, and best practice recommendations. A written personalized care plan for preventive services as well as general preventive health recommendations were provided to patient.     Kate Sable, LPN, LPN  [id number]

## 2018-01-27 ENCOUNTER — Encounter: Payer: Self-pay | Admitting: Family Medicine

## 2018-01-27 ENCOUNTER — Ambulatory Visit: Payer: Medicare Other | Admitting: Family Medicine

## 2018-01-27 ENCOUNTER — Telehealth: Payer: Self-pay

## 2018-01-27 ENCOUNTER — Encounter (HOSPITAL_COMMUNITY): Payer: Medicare Other

## 2018-01-27 ENCOUNTER — Other Ambulatory Visit: Payer: Self-pay

## 2018-01-27 VITALS — BP 112/62 | HR 77 | Resp 12 | Ht 65.5 in | Wt 251.0 lb

## 2018-01-27 DIAGNOSIS — W19XXXS Unspecified fall, sequela: Secondary | ICD-10-CM

## 2018-01-27 DIAGNOSIS — J45909 Unspecified asthma, uncomplicated: Secondary | ICD-10-CM | POA: Diagnosis not present

## 2018-01-27 DIAGNOSIS — R0989 Other specified symptoms and signs involving the circulatory and respiratory systems: Secondary | ICD-10-CM

## 2018-01-27 DIAGNOSIS — M170 Bilateral primary osteoarthritis of knee: Secondary | ICD-10-CM

## 2018-01-27 DIAGNOSIS — Z6841 Body Mass Index (BMI) 40.0 and over, adult: Secondary | ICD-10-CM

## 2018-01-27 DIAGNOSIS — Z1211 Encounter for screening for malignant neoplasm of colon: Secondary | ICD-10-CM

## 2018-01-27 NOTE — Assessment & Plan Note (Addendum)
Recurrent l;light headedness, with recent fall, needs doppler to assess carotid bruit

## 2018-01-27 NOTE — Telephone Encounter (Signed)
cologuard order entered, printed, and faxed.

## 2018-01-27 NOTE — Patient Instructions (Addendum)
F/U as before, call if you need me before  You are being referred for carotid US to look at circulation to your brain since you report light headedness, will try to get same day as mammogram   Please work on reducing pizza, so weight and cholesterol get better   Cologuard to be sent from home

## 2018-01-27 NOTE — Progress Notes (Signed)
   Angela Chase     MRN: [id number]      DOB: [birth year]   HPI Angela Chase is here for follow up of recent ED viist, on 01/23/2018, feels she may have blacked out when she actually fell and for the e past 2 months has had intermittent episodes of light headedness ROS Denies recent fever or chills. Denies sinus pressure, nasal congestion, ear pain or sore throat. Denies chest congestion, productive cough or wheezing. Denies chest pains, palpitations and leg swelling Denies abdominal pain, nausea, vomiting,diarrhea or constipation.   Denies dysuria, frequency, hesitancy or incontinence. c/o joint pain, swelling and limitation in mobility. Denies headaches, seizures, numbness, or tingling. Denies uncontrolled  Depression,ongoing c/o vandalization of her property with loss , takes antidepressant irregularly Denies skin break down or rash.   PE  BP 112/62 (BP Location: Left Arm, Patient Position: Sitting, Cuff Size: Large)   Pulse 77   Resp 12   Ht 5' 5.5" (1.664 m)   Wt 251 lb (113.9 kg)   SpO2 93% Comment: room air  BMI 41.13 kg/m   Patient alert and oriented and in no cardiopulmonary distress.  HEENT: No facial asymmetry, EOMI,   oropharynx pink and moist.  Neck supple no JVD, no mass.carotid bruit present  Chest: Clear to auscultation bilaterally.  CVS: S1, S2 no murmurs, no S3.Regular rate.  ABD: Soft non tender.   Ext: No edema  MS: Decreased  ROM spine, shoulders, hips and knees.  Skin: Intact, no ulcerations or rash noted.  Psych: Good eye contact, normal affect. Memory intact mildly  anxious and  depressed appearing.  CNS: CN 2-12 intact, power,  normal throughout.no focal deficits noted.   Assessment & Plan  Carotid bruit Recurrent l;light headedness, with recent fall, needs doppler to assess carotid bruit  Intrinsic asthma Controlled, no change in medication   Falls, sequela Recurrent fall, most recent in 12/2017. Multifactorial, including  severe osteoarthritis of knee, and morbid obesity, reports episodes of light headedness Home safety and fall precautions discussed. Would benefit from PT no interest in pursuing this at this time, currently working on knee   Osteoarthritis of both knees Severe with recurrent falls, being evaluated by orthopedics currently  Morbid obesity with BMI of 40.0-44.9, adult Unchanged Patient re-educated about  the importance of commitment to a  minimum of 150 minutes of exercise per week.  The importance of healthy food choices with portion control discussed. Encouraged to start a food diary, count calories and to consider  joining a support group. Sample diet sheets offered. Goals set by the patient for the next several months.   Weight /BMI 01/27/2018 01/26/2018 01/23/2018  WEIGHT 251 lb 251 lb 250 lb  HEIGHT 5' 5.5" 5\' 5"  5\' 6"   BMI 41.13 kg/m2 41.77 kg/m2 40.35 kg/m2

## 2018-02-03 ENCOUNTER — Ambulatory Visit (HOSPITAL_COMMUNITY)
Admission: RE | Admit: 2018-02-03 | Discharge: 2018-02-03 | Disposition: A | Discharge status: Home / Self Care | Payer: Medicare Other | Source: Ambulatory Visit | Attending: Family Medicine | Admitting: Family Medicine

## 2018-02-03 DIAGNOSIS — R0989 Other specified symptoms and signs involving the circulatory and respiratory systems: Secondary | ICD-10-CM | POA: Insufficient documentation

## 2018-02-03 DIAGNOSIS — Z13820 Encounter for screening for osteoporosis: Secondary | ICD-10-CM | POA: Diagnosis present

## 2018-02-03 DIAGNOSIS — Z780 Asymptomatic menopausal state: Secondary | ICD-10-CM

## 2018-02-03 DIAGNOSIS — R296 Repeated falls: Secondary | ICD-10-CM | POA: Insufficient documentation

## 2018-02-03 DIAGNOSIS — R921 Mammographic calcification found on diagnostic imaging of breast: Secondary | ICD-10-CM | POA: Diagnosis present

## 2018-02-05 ENCOUNTER — Other Ambulatory Visit: Payer: Self-pay | Admitting: Family Medicine

## 2018-02-05 ENCOUNTER — Telehealth: Payer: Self-pay | Admitting: Family Medicine

## 2018-02-05 MED ORDER — HYDROCODONE-ACETAMINOPHEN 5-325 MG PO TABS: ORAL_TABLET | ORAL | 0 refills | Status: DC

## 2018-02-05 NOTE — Telephone Encounter (Signed)
C/o disabling back and shoulder pain following recent fall, had been in the office recently, I advised I will prescribe 5 days of pain medication for bedtime use only

## 2018-02-05 NOTE — Telephone Encounter (Signed)
Patient called & left voicemail that she needs her biopsy scheduled. She tried to schedule with [facility], they told her we had to do this, call [phone]/ [phone] [facility].

## 2018-02-05 NOTE — Telephone Encounter (Signed)
Patient aware.

## 2018-02-05 NOTE — Telephone Encounter (Signed)
We cannot do it either, she has to call [phone] at the breast center. They will not schedule with anyone but the patient

## 2018-02-05 NOTE — Progress Notes (Signed)
Hydrocodone x 5 days for acute pain

## 2018-02-22 ENCOUNTER — Telehealth: Payer: Self-pay | Admitting: Family Medicine

## 2018-02-22 ENCOUNTER — Encounter: Payer: Self-pay | Admitting: Family Medicine

## 2018-02-22 NOTE — Telephone Encounter (Signed)
Pt has been  Told both by Radiology dept as well as by myself that she needs a biopsy of left breast due to abnormal calcifications ,since 02/03/2018. She has already had breast cancer in the past. She still has not followed through,and  no appointment is in the system, very important that we contact pt again and see that she clearly understands and follows through.  Please let me know if unable to complete by end of the week, very important and pt gets somewhat confused at times. It is also very difficult to contact her ?? pls ask

## 2018-02-22 NOTE — Assessment & Plan Note (Signed)
Controlled, no change in medication  

## 2018-02-22 NOTE — Assessment & Plan Note (Signed)
Unchanged Patient re-educated about  the importance of commitment to a  minimum of 150 minutes of exercise per week.  The importance of healthy food choices with portion control discussed. Encouraged to start a food diary, count calories and to consider  joining a support group. Sample diet sheets offered. Goals set by the patient for the next several months.   Weight /BMI 01/27/2018 01/26/2018 01/23/2018  WEIGHT 251 lb 251 lb 250 lb  HEIGHT 5' 5.5" 5\' 5"  5\' 6"   BMI 41.13 kg/m2 41.77 kg/m2 40.35 kg/m2

## 2018-02-22 NOTE — Assessment & Plan Note (Signed)
Recurrent fall, most recent in 12/2017. Multifactorial, including severe osteoarthritis of knee, and morbid obesity, reports episodes of light headedness Home safety and fall precautions discussed. Would benefit from PT no interest in pursuing this at this time, currently working on knee

## 2018-02-22 NOTE — Assessment & Plan Note (Addendum)
Severe with recurrent falls, being evaluated by orthopedics currently

## 2018-02-23 ENCOUNTER — Other Ambulatory Visit: Payer: Self-pay | Admitting: Family Medicine

## 2018-02-23 ENCOUNTER — Telehealth: Payer: Self-pay

## 2018-02-23 DIAGNOSIS — N6320 Unspecified lump in the left breast, unspecified quadrant: Secondary | ICD-10-CM

## 2018-02-23 DIAGNOSIS — R921 Mammographic calcification found on diagnostic imaging of breast: Secondary | ICD-10-CM

## 2018-02-23 NOTE — Telephone Encounter (Signed)
Angela Chase called the breast center and they told her that it cannot be scheduled until the Dr puts the order in for the breast biopsy. Please enter and they will call the patient and schedule

## 2018-02-23 NOTE — Progress Notes (Signed)
Breast biopsy

## 2018-02-23 NOTE — Telephone Encounter (Signed)
Breast biopsy order entered per the breast center

## 2018-02-23 NOTE — Telephone Encounter (Signed)
[facility] is [facility] for [facility] 10-31 @ 10:10

## 2018-02-23 NOTE — Telephone Encounter (Signed)
pls check on thios and let me know, I have not had to do this before, ny clear understanding is that radiology is responsible, I have entered an order, pls check and order anything else needed for this to be done, this is not a standard situation where we are just trying to get a mammogram

## 2018-02-26 ENCOUNTER — Other Ambulatory Visit: Payer: Medicare Other

## 2018-03-04 ENCOUNTER — Ambulatory Visit
Admission: RE | Admit: 2018-03-04 | Discharge: 2018-03-04 | Disposition: A | Discharge status: Home / Self Care | Payer: Medicare Other | Source: Ambulatory Visit | Attending: Family Medicine | Admitting: Family Medicine

## 2018-03-04 DIAGNOSIS — N6320 Unspecified lump in the left breast, unspecified quadrant: Secondary | ICD-10-CM

## 2018-04-20 ENCOUNTER — Other Ambulatory Visit: Payer: Self-pay | Admitting: Family Medicine

## 2018-04-28 ENCOUNTER — Encounter: Payer: Self-pay | Admitting: *Deleted

## 2018-06-03 ENCOUNTER — Encounter: Payer: Self-pay | Admitting: Family Medicine

## 2018-06-08 ENCOUNTER — Ambulatory Visit: Payer: Medicare Other | Admitting: Family Medicine

## 2018-06-08 ENCOUNTER — Telehealth: Payer: Self-pay

## 2018-06-08 ENCOUNTER — Encounter: Payer: Self-pay | Admitting: Family Medicine

## 2018-06-08 VITALS — BP 118/72 | HR 62 | Resp 15 | Ht 66.0 in | Wt 252.0 lb

## 2018-06-08 DIAGNOSIS — F321 Major depressive disorder, single episode, moderate: Secondary | ICD-10-CM

## 2018-06-08 DIAGNOSIS — M170 Bilateral primary osteoarthritis of knee: Secondary | ICD-10-CM

## 2018-06-08 DIAGNOSIS — Z6841 Body Mass Index (BMI) 40.0 and over, adult: Secondary | ICD-10-CM

## 2018-06-08 DIAGNOSIS — R600 Localized edema: Secondary | ICD-10-CM

## 2018-06-08 DIAGNOSIS — E785 Hyperlipidemia, unspecified: Secondary | ICD-10-CM | POA: Diagnosis not present

## 2018-06-08 DIAGNOSIS — R296 Repeated falls: Secondary | ICD-10-CM

## 2018-06-08 MED ORDER — SERTRALINE HCL 25 MG PO TABS: 25.0000 mg | ORAL_TABLET | Freq: Every day | ORAL | 5 refills | Status: DC

## 2018-06-08 MED ORDER — LOSARTAN POTASSIUM 25 MG PO TABS: 25.0000 mg | ORAL_TABLET | Freq: Every day | ORAL | 3 refills | Status: DC

## 2018-06-08 MED ORDER — MONTELUKAST SODIUM 10 MG PO TABS: 10.0000 mg | ORAL_TABLET | Freq: Every day | ORAL | 3 refills | Status: DC

## 2018-06-08 MED ORDER — PREDNISONE 10 MG PO TABS: ORAL_TABLET | ORAL | 1 refills | Status: DC

## 2018-06-08 MED ORDER — CALCIUM CARB-CHOLECALCIFEROL 600-1000 MG-UNIT PO CAPS: ORAL_CAPSULE | ORAL | 11 refills | Status: DC

## 2018-06-08 MED ORDER — ACETAMINOPHEN 500 MG PO TABS: ORAL_TABLET | ORAL | 3 refills | Status: DC

## 2018-06-08 MED ORDER — ALBUTEROL SULFATE HFA 108 (90 BASE) MCG/ACT IN AERS: 2.0000 | INHALATION_SPRAY | Freq: Four times a day (QID) | RESPIRATORY_TRACT | 2 refills | Status: DC | PRN

## 2018-06-08 NOTE — Assessment & Plan Note (Signed)
Home safety and fall risk reduction discussed 

## 2018-06-08 NOTE — Progress Notes (Signed)
   Angela Chase     MRN: [id number]      DOB: 1945/09/11   HPI Ms. Forstner is here for follow up and re-evaluation of chronic medical conditions, medication management and review of any available recent lab and radiology data.  Preventive health is updated, specifically  Cancer screening and Immunization.   Questions or concerns regarding consultations or procedures which the PT has had in the interim are  Addressed. Has bilateral cataract surgery for Feb and March 2020 The PT denies any adverse reactions to current medications since the last visit.  C/o ongoing depression and anxiety, interested in therapy and states she will resume medication as this will make her " feel better". Not suicidal or homicidal. Less tearful and anxious than in the past. C/ojoint pain esp knee , and with cold weather, will use tylenol and ibuprofen sparingly, when needed ROS Denies recent fever or chills. Denies sinus pressure, nasal congestion, ear pain or sore throat. Denies chest congestion, productive cough or wheezing. Denies chest pains, palpitations and leg swelling Denies abdominal pain, nausea, vomiting,diarrhea or constipation.   Denies dysuria, frequency, hesitancy or incontinence. ty. Denies headaches, seizures, numbness, or tingling.  Denies skin break down or rash.   PE  BP 118/72   Pulse 62   Resp 15   Ht 5\' 6"  (1.676 m)   Wt 252 lb (114.3 kg)   SpO2 93%   BMI 40.67 kg/m   Patient alert and oriented and in no cardiopulmonary distress.  HEENT: No facial asymmetry, EOMI,   oropharynx pink and moist.  Neck supple no JVD, no mass.  Chest: Clear to auscultation bilaterally.  CVS: S1, S2 no murmurs, no S3.Regular rate.  ABD: Soft non tender.   Ext: No edema  MS: Adequate though reduced  ROM spine, shoulders, hips and particularly knees.  Skin: Intact, no ulcerations or rash noted.  Psych: Good eye contact, normal affect. Memory intact mildly  anxious and  depressed  appearing.  CNS: CN 2-12 intact, power,  normal throughout.no focal deficits noted.   Assessment & Plan  Depression, major, single episode, moderate (HCC) Script sent for sertraline 25 mg daily and referred to telepsych starting in office, not suicidal or homicidal  Bilateral leg edema Controlled on current medication with leg elevateion and salt restriction, continue same  Morbid obesity with BMI of 40.0-44.9, adult Deteriorated.  Patient re-educated about  the importance of commitment to a  minimum of 150 minutes of exercise per week as able.  The importance of healthy food choices with portion control discussed, as well as eating regularly and within a 12 hour window most days. The need to choose "clean , green" food 50 to 75% of the time is discussed, as well as to make water the primary drink and set a goal of 64 ounces water daily.  Encouraged to start a food diary,  and to consider  joining a support group. Sample diet sheets offered. Goals set by the patient for the next several months.   Weight /BMI 06/08/2018 01/27/2018 01/26/2018  WEIGHT 252 lb 251 lb 251 lb  HEIGHT 5\' 6"  5' 5.5" 5\' 5"   BMI 40.67 kg/m2 41.13 kg/m2 41.77 kg/m2      Osteoarthritis of both knees Reports replacement recommended, however , no interest in pursuing currently  Recurrent falls while walking Home safety and fall risk reduction discussed

## 2018-06-08 NOTE — Assessment & Plan Note (Signed)
Script sent for sertraline 25 mg daily and referred to telepsych starting in office, not suicidal or homicidal

## 2018-06-08 NOTE — Assessment & Plan Note (Signed)
Reports replacement recommended, however , no interest in pursuing currently

## 2018-06-08 NOTE — Assessment & Plan Note (Signed)
Deteriorated.  Patient re-educated about  the importance of commitment to a  minimum of 150 minutes of exercise per week as able.  The importance of healthy food choices with portion control discussed, as well as eating regularly and within a 12 hour window most days. The need to choose "clean , green" food 50 to 75% of the time is discussed, as well as to make water the primary drink and set a goal of 64 ounces water daily.  Encouraged to start a food diary,  and to consider  joining a support group. Sample diet sheets offered. Goals set by the patient for the next several months.   Weight /BMI 06/08/2018 01/27/2018 01/26/2018  WEIGHT 252 lb 251 lb 251 lb  HEIGHT 5\' 6"  5' 5.5" 5\' 5"   BMI 40.67 kg/m2 41.13 kg/m2 41.77 kg/m2

## 2018-06-08 NOTE — Patient Instructions (Addendum)
Annual physical exam in 4 months, call if you need me sooner  Best wishes with upcoming cataract surgeries  Medications as discussed  You are referred for psychotherapy, start today in the office  Fasting lipid, cmp  and EGFr today   Think about what you will eat, plan ahead. Choose " clean, green, fresh or frozen" over canned, processed or packaged foods which are more sugary, salty and fatty. 70 to 75% of food eaten should be vegetables and fruit. Three meals at set times with snacks allowed between meals, but they must be fruit or vegetables. Aim to eat over a 12 hour period , example 7 am to 7 pm, and STOP after  your last meal of the day. Drink water,generally about 64 ounces per day, no other drink is as healthy. Fruit juice is best enjoyed in a healthy way, by EATING the fruit.   Thank you  for choosing [facility]. We consider it a privelige to serve you.  Delivering excellent health care in a caring and  compassionate way is our goal.  Partnering with you,  so that together we can achieve this goal is our strategy.

## 2018-06-08 NOTE — Assessment & Plan Note (Addendum)
Controlled on current medication with leg elevateion and salt restriction, continue same

## 2018-06-08 NOTE — Telephone Encounter (Signed)
[facility] Referral Que - Left Message

## 2018-06-09 LAB — COMPLETE METABOLIC PANEL WITH GFR
AG RATIO: 1 (calc) (ref 1.0–2.5)
ALBUMIN MSPROF: 3.8 g/dL (ref 3.6–5.1)
ALKALINE PHOSPHATASE (APISO): 74 U/L (ref 37–153)
ALT: 12 U/L (ref 6–29)
AST: 18 U/L (ref 10–35)
BILIRUBIN TOTAL: 1.1 mg/dL (ref 0.2–1.2)
BUN: 7 mg/dL (ref 7–25)
CHLORIDE: 104 mmol/L (ref 98–110)
CO2: 23 mmol/L (ref 20–32)
Calcium: 9.5 mg/dL (ref 8.6–10.4)
Creat: 0.64 mg/dL (ref 0.60–0.93)
GFR, EST AFRICAN AMERICAN: 104 mL/min/{1.73_m2} (ref >=60)
GFR, Est Non African American: 90 mL/min/{1.73_m2} (ref >=60)
GLOBULIN: 3.7 g/dL (ref 1.9–3.7)
Glucose, Bld: 87 mg/dL (ref 65–99)
Potassium: 4.1 mmol/L (ref 3.5–5.3)
Sodium: 140 mmol/L (ref 135–146)
Total Protein: 7.5 g/dL (ref 6.1–8.1)

## 2018-06-09 LAB — LIPID PANEL
CHOL/HDL RATIO: 3.7 (calc) (ref <5.0)
CHOLESTEROL: 212 mg/dL — AB (ref <200)
HDL: 57 mg/dL (ref >=50)
LDL CHOLESTEROL (CALC): 138 mg/dL — AB
Non-HDL Cholesterol (Calc): 155 mg/dL (calc) — ABNORMAL HIGH (ref <130)
TRIGLYCERIDES: 76 mg/dL (ref <150)

## 2018-06-12 ENCOUNTER — Encounter (HOSPITAL_COMMUNITY): Payer: Self-pay

## 2018-06-15 ENCOUNTER — Encounter (HOSPITAL_COMMUNITY)
Admission: RE | Admit: 2018-06-15 | Discharge: 2018-06-15 | Disposition: A | Discharge status: Home / Self Care | Payer: Medicare Other | Source: Ambulatory Visit | Attending: Ophthalmology | Admitting: Ophthalmology

## 2018-06-15 ENCOUNTER — Telehealth: Payer: Self-pay

## 2018-06-15 DIAGNOSIS — Z01818 Encounter for other preprocedural examination: Secondary | ICD-10-CM | POA: Insufficient documentation

## 2018-06-15 NOTE — Telephone Encounter (Signed)
Called patient to ask if she had the cologuard test, and if she had done it yet. She had misplaced it, but has found it and will do it as soon as she can.

## 2018-06-17 ENCOUNTER — Telehealth: Payer: Self-pay

## 2018-06-17 NOTE — Telephone Encounter (Signed)
2nd attempt - [facility]   

## 2018-06-22 ENCOUNTER — Ambulatory Visit (HOSPITAL_COMMUNITY): Payer: Medicare Other | Admitting: Anesthesiology

## 2018-06-22 ENCOUNTER — Ambulatory Visit (HOSPITAL_COMMUNITY)
Admission: RE | Admit: 2018-06-22 | Discharge: 2018-06-22 | Disposition: A | Discharge status: Home / Self Care | Payer: Medicare Other | Attending: Ophthalmology | Admitting: Ophthalmology

## 2018-06-22 ENCOUNTER — Encounter (HOSPITAL_COMMUNITY)
Admission: RE | Disposition: A | Discharge status: Home / Self Care | Payer: Self-pay | Source: Home / Self Care | Attending: Ophthalmology

## 2018-06-22 ENCOUNTER — Encounter (HOSPITAL_COMMUNITY): Payer: Self-pay | Admitting: Anesthesiology

## 2018-06-22 DIAGNOSIS — K219 Gastro-esophageal reflux disease without esophagitis: Secondary | ICD-10-CM | POA: Diagnosis not present

## 2018-06-22 DIAGNOSIS — Z7982 Long term (current) use of aspirin: Secondary | ICD-10-CM | POA: Insufficient documentation

## 2018-06-22 DIAGNOSIS — H259 Unspecified age-related cataract: Secondary | ICD-10-CM | POA: Insufficient documentation

## 2018-06-22 DIAGNOSIS — Z888 Allergy status to other drugs, medicaments and biological substances status: Secondary | ICD-10-CM | POA: Diagnosis not present

## 2018-06-22 DIAGNOSIS — Z79899 Other long term (current) drug therapy: Secondary | ICD-10-CM | POA: Diagnosis not present

## 2018-06-22 DIAGNOSIS — F419 Anxiety disorder, unspecified: Secondary | ICD-10-CM | POA: Insufficient documentation

## 2018-06-22 DIAGNOSIS — M1990 Unspecified osteoarthritis, unspecified site: Secondary | ICD-10-CM | POA: Diagnosis not present

## 2018-06-22 DIAGNOSIS — J45909 Unspecified asthma, uncomplicated: Secondary | ICD-10-CM | POA: Insufficient documentation

## 2018-06-22 DIAGNOSIS — Z881 Allergy status to other antibiotic agents status: Secondary | ICD-10-CM | POA: Insufficient documentation

## 2018-06-22 DIAGNOSIS — H52201 Unspecified astigmatism, right eye: Secondary | ICD-10-CM | POA: Diagnosis not present

## 2018-06-22 DIAGNOSIS — F329 Major depressive disorder, single episode, unspecified: Secondary | ICD-10-CM | POA: Diagnosis not present

## 2018-06-22 DIAGNOSIS — H269 Unspecified cataract: Secondary | ICD-10-CM | POA: Diagnosis present

## 2018-06-22 DIAGNOSIS — G4730 Sleep apnea, unspecified: Secondary | ICD-10-CM | POA: Diagnosis not present

## 2018-06-22 HISTORY — PX: CATARACT EXTRACTION W/PHACO: SHX586

## 2018-06-22 SURGERY — PHACOEMULSIFICATION, CATARACT, WITH IOL INSERTION
Anesthesia: Monitor Anesthesia Care | Site: Eye | Laterality: Right

## 2018-06-22 MED ORDER — SODIUM HYALURONATE 23 MG/ML IO SOLN
INTRAOCULAR | Status: DC | PRN
  Administered 2018-06-22: 0.6 mL via INTRAOCULAR

## 2018-06-22 MED ORDER — MIDAZOLAM HCL 2 MG/2ML IJ SOLN
INTRAMUSCULAR | Status: AC
  Filled 2018-06-22: qty 2

## 2018-06-22 MED ORDER — MIDAZOLAM HCL 5 MG/5ML IJ SOLN
INTRAMUSCULAR | Status: DC | PRN
  Administered 2018-06-22: 1 mg via INTRAVENOUS

## 2018-06-22 MED ORDER — TETRACAINE HCL 0.5 % OP SOLN
1.0000 [drp] | OPHTHALMIC | Status: AC | PRN
  Administered 2018-06-22 (×3): 1 [drp] via OPHTHALMIC

## 2018-06-22 MED ORDER — BSS IO SOLN
INTRAOCULAR | Status: DC | PRN
  Administered 2018-06-22: 15 mL

## 2018-06-22 MED ORDER — EPINEPHRINE PF 1 MG/ML IJ SOLN
INTRAOCULAR | Status: DC | PRN
  Administered 2018-06-22: 500 mL

## 2018-06-22 MED ORDER — LIDOCAINE HCL (PF) 1 % IJ SOLN
INTRAOCULAR | Status: DC | PRN
  Administered 2018-06-22: 1 mL via OPHTHALMIC

## 2018-06-22 MED ORDER — PHENYLEPHRINE HCL 2.5 % OP SOLN
1.0000 [drp] | OPHTHALMIC | Status: AC | PRN
  Administered 2018-06-22 (×3): 1 [drp] via OPHTHALMIC

## 2018-06-22 MED ORDER — NEOMYCIN-POLYMYXIN-DEXAMETH 3.5-10000-0.1 OP SUSP
OPHTHALMIC | Status: DC | PRN
  Administered 2018-06-22: 2 [drp] via OPHTHALMIC

## 2018-06-22 MED ORDER — POVIDONE-IODINE 5 % OP SOLN
OPHTHALMIC | Status: DC | PRN
  Administered 2018-06-22: 1 via OPHTHALMIC

## 2018-06-22 MED ORDER — PROVISC 10 MG/ML IO SOLN
INTRAOCULAR | Status: DC | PRN
  Administered 2018-06-22: 0.85 mL via INTRAOCULAR

## 2018-06-22 MED ORDER — EPINEPHRINE PF 1 MG/ML IJ SOLN
INTRAMUSCULAR | Status: AC
  Filled 2018-06-22: qty 2

## 2018-06-22 MED ORDER — CYCLOPENTOLATE-PHENYLEPHRINE 0.2-1 % OP SOLN
1.0000 [drp] | OPHTHALMIC | Status: AC | PRN
  Administered 2018-06-22 (×3): 1 [drp] via OPHTHALMIC

## 2018-06-22 MED ORDER — LIDOCAINE HCL 3.5 % OP GEL
1.0000 "application " | Freq: Once | OPHTHALMIC | Status: AC
  Administered 2018-06-22: 1 via OPHTHALMIC

## 2018-06-22 SURGICAL SUPPLY — 20 items
CLOTH BEACON ORANGE TIMEOUT ST (SAFETY) ×2 IMPLANT
EYE SHIELD UNIVERSAL CLEAR (GAUZE/BANDAGES/DRESSINGS) ×2 IMPLANT
GLOVE BIOGEL PI IND STRL 7.0 (GLOVE) IMPLANT
GLOVE BIOGEL PI IND STRL 7.5 (GLOVE) IMPLANT
GLOVE BIOGEL PI INDICATOR 7.0 (GLOVE) ×4
GLOVE BIOGEL PI INDICATOR 7.5 (GLOVE) ×2
LENS IOL TECNIS TRC I 150 17.5 IMPLANT
LENS IOL TORIC 150 17.5 ×1 IMPLANT
LENS IOL TORIC 17.5 ×3 IMPLANT
NDL HYPO 18GX1.5 BLUNT FILL (NEEDLE) IMPLANT
NEEDLE HYPO 18GX1.5 BLUNT FILL (NEEDLE) ×3 IMPLANT
PAD ARMBOARD 7.5X6 YLW CONV (MISCELLANEOUS) ×2 IMPLANT
PROC W SPEC LENS (INTRAOCULAR LENS) ×3
PROCESS W SPEC LENS (INTRAOCULAR LENS) IMPLANT
RING MALYGIN (MISCELLANEOUS) IMPLANT
SYR TB 1ML LL NO SAFETY (SYRINGE) ×2 IMPLANT
TAPE SURG TRANSPORE 1 IN (GAUZE/BANDAGES/DRESSINGS) IMPLANT
TAPE SURGICAL TRANSPORE 1 IN (GAUZE/BANDAGES/DRESSINGS) ×2
VISCOELASTIC ADDITIONAL (OPHTHALMIC RELATED) ×2 IMPLANT
WATER STERILE IRR 250ML POUR (IV SOLUTION) ×2 IMPLANT

## 2018-06-22 NOTE — Anesthesia Preprocedure Evaluation (Signed)
Anesthesia Evaluation  Patient identified by MRN, date of birth, ID band Patient awake    Reviewed: Allergy & Precautions, H&P , NPO status , Patient's Chart, lab work & pertinent test results  Airway Mallampati: II  TM Distance: >3 FB Neck ROM: full    Dental no notable dental hx.    Pulmonary asthma , sleep apnea ,    Pulmonary exam normal breath sounds clear to auscultation       Cardiovascular Exercise Tolerance: Good negative cardio ROS   Rhythm:regular Rate:Normal     Neuro/Psych PSYCHIATRIC DISORDERS Anxiety Depression negative neurological ROS     GI/Hepatic Neg liver ROS, GERD  ,  Endo/Other  Morbid obesity  Renal/GU negative Renal ROS  negative genitourinary   Musculoskeletal   Abdominal   Peds  Hematology negative hematology ROS (+)   Anesthesia Other Findings   Reproductive/Obstetrics negative OB ROS                             Anesthesia Physical Anesthesia Plan  ASA: III  Anesthesia Plan: MAC   Post-op Pain Management:    Induction:   PONV Risk Score and Plan:   Airway Management Planned:   Additional Equipment:   Intra-op Plan:   Post-operative Plan:   Informed Consent: I have reviewed the patients History and Physical, chart, labs and discussed the procedure including the risks, benefits and alternatives for the proposed anesthesia with the patient or authorized representative who has indicated his/her understanding and acceptance.       Plan Discussed with: CRNA  Anesthesia Plan Comments:         Anesthesia Quick Evaluation

## 2018-06-22 NOTE — Discharge Instructions (Addendum)
PATIENT INSTRUCTIONS °POST-ANESTHESIA ° °IMMEDIATELY FOLLOWING SURGERY:  Do not drive or operate machinery for the first twenty four hours after surgery.  Do not make any important decisions for twenty four hours after surgery or while taking narcotic pain medications or sedatives.  If you develop intractable nausea and vomiting or a severe headache please notify your doctor immediately. ° °FOLLOW-UP:  Please make an appointment with your surgeon as instructed. You do not need to follow up with anesthesia unless specifically instructed to do so. ° °WOUND CARE INSTRUCTIONS (if applicable):  Keep a dry clean dressing on the anesthesia/puncture wound site if there is drainage.  Once the wound has quit draining you may leave it open to air.  Generally you should leave the bandage intact for twenty four hours unless there is drainage.  If the epidural site drains for more than 36-48 hours please call the anesthesia department. ° °QUESTIONS?:  Please feel free to call your physician or the hospital operator if you have any questions, and they will be happy to assist you.    ° ° °Please discharge patient when stable, will follow up today with Dr. Wrzosek at the [facility] office immediately following discharge.  Leave shield in place until visit.  All paperwork with discharge instructions will be given at the office. ° °

## 2018-06-22 NOTE — Anesthesia Postprocedure Evaluation (Signed)
Anesthesia Post Note  Patient: Angela Chase  Procedure(s) Performed: CATARACT EXTRACTION PHACO AND INTRAOCULAR LENS PLACEMENT (IOC) (Right Eye)  Patient location during evaluation: Short Stay Anesthesia Type: MAC Level of consciousness: awake and alert and oriented Pain management: pain level controlled Vital Signs Assessment: post-procedure vital signs reviewed and stable Respiratory status: spontaneous breathing Cardiovascular status: blood pressure returned to baseline and stable Postop Assessment: no apparent nausea or vomiting Anesthetic complications: no     Last Vitals:  Vitals:   06/22/18 0930  BP: (!) 121/53  Pulse: 70  Resp: 20  Temp: 36.6 C  SpO2: 99%    Last Pain:  Vitals:   06/22/18 0930  PainSc: 5                  Sarinity Dicicco

## 2018-06-22 NOTE — Transfer of Care (Signed)
Immediate Anesthesia Transfer of Care Note  Patient: Angela Chase  Procedure(s) Performed: CATARACT EXTRACTION PHACO AND INTRAOCULAR LENS PLACEMENT (IOC) (Right Eye)  Patient Location: Short Stay  Anesthesia Type:MAC  Level of Consciousness: awake  Airway & Oxygen Therapy: Patient Spontanous Breathing  Post-op Assessment: Report given to RN  Post vital signs: Reviewed and stable  Last Vitals:  Vitals Value Taken Time  BP    Temp    Pulse    Resp    SpO2      Last Pain:  Vitals:   06/22/18 0930  PainSc: 5          Complications: No apparent anesthesia complications

## 2018-06-22 NOTE — Op Note (Signed)
Date of procedure: 06/22/18  Pre-operative diagnosis: Visually significant age-related cataract, Right Eye; Visually Significant Astigmatism, Right Eye (H25.11)  Post-operative diagnosis: Visually significant age-related cataract, Right Eye; Visually Significant Astigmatism, Right Eye  Procedure: Removal of cataract via phacoemulsification and insertion of intra-ocular lens AMO ZCT150 +17.5D into the capsular bag of the Right Eye  Attending surgeon: Gerda Diss. Bettyjo Lundblad, MD, MA  Anesthesia: MAC, Topical Akten  Complications: None  Estimated Blood Loss: <51m (minimal)  Specimens: None  Implants: As above  Indications:  Visually significant age-related cataract, Right Eye; Visually Significant Astigmatism, Right Eye  Procedure:  The patient was seen and identified in the pre-operative area. The operative eye was identified and dilated.  The operative eye was marked.  Pre-operative toric markers were used to mark the eye at 0 and 180 degrees. Topical anesthesia was administered to the operative eye.     The patient was then to the operative suite and placed in the supine position.  A timeout was performed confirming the patient, procedure to be performed, and all other relevant information.   The patient's face was prepped and draped in the usual fashion for intra-ocular surgery.  A lid speculum was placed into the operative eye and the surgical microscope moved into place and focused.  A superotemporal paracentesis was created using a 20 gauge paracentesis blade.  Shugarcaine was injected into the anterior chamber.  Viscoelastic was injected into the anterior chamber.  A temporal clear-corneal main wound incision was created using a 2.449mmicrokeratome.  A continuous curvilinear capsulorrhexis was initiated using an irrigating cystitome and completed using capsulorrhexis forceps.  Hydrodissection and hydrodeliniation were performed.  Viscoelastic was injected into the anterior chamber.  A  phacoemulsification handpiece and a chopper as a second instrument were used to remove the nucleus and epinucleus. The irrigation/aspiration handpiece was used to remove any remaining cortical material.   The capsular bag was reinflated with viscoelastic, checked, and found to be intact.  The intraocular lens was inserted into the capsular bag and dialed into place using a Kuglen hook to 167 degrees.  The irrigation/aspiration handpiece was used to remove any remaining viscoelastic.  The clear corneal wound and paracentesis wounds were then hydrated and checked with Weck-Cels to be watertight.  The lid-speculum and drape was removed, and the patient's face was cleaned with a wet and dry 4x4.  Maxitrol was instilled in the eye before a clear shield was taped over the eye. The patient was taken to the post-operative care unit in good condition, having tolerated the procedure well.  Post-Op Instructions: [facility] and will receive all other orders and instructions.

## 2018-06-22 NOTE — H&P (Signed)
The H and P was reviewed and updated. The patient was examined.  No changes were found after exam.  The surgical eye was marked.  

## 2018-06-23 ENCOUNTER — Telehealth: Payer: Self-pay

## 2018-06-23 NOTE — Telephone Encounter (Signed)
Several attempts have been made to contact patient without success. Patient will be placed on the inactive list.  If services are needed again.  Please contact [facility] at [phone].    Information will be routed to the PCP and Dr. Hisada  

## 2018-06-24 ENCOUNTER — Encounter (HOSPITAL_COMMUNITY): Payer: Self-pay | Admitting: Ophthalmology

## 2018-06-30 ENCOUNTER — Encounter (HOSPITAL_COMMUNITY)
Admission: RE | Admit: 2018-06-30 | Discharge: 2018-06-30 | Disposition: A | Discharge status: Home / Self Care | Payer: Medicare Other | Source: Ambulatory Visit | Attending: Ophthalmology | Admitting: Ophthalmology

## 2018-07-06 ENCOUNTER — Encounter (HOSPITAL_COMMUNITY)
Admission: RE | Disposition: A | Discharge status: Home / Self Care | Payer: Self-pay | Source: Home / Self Care | Attending: Ophthalmology

## 2018-07-06 ENCOUNTER — Encounter (HOSPITAL_COMMUNITY): Payer: Self-pay

## 2018-07-06 ENCOUNTER — Ambulatory Visit (HOSPITAL_COMMUNITY): Payer: Medicare Other | Admitting: Anesthesiology

## 2018-07-06 ENCOUNTER — Ambulatory Visit (HOSPITAL_COMMUNITY)
Admission: RE | Admit: 2018-07-06 | Discharge: 2018-07-06 | Disposition: A | Discharge status: Home / Self Care | Payer: Medicare Other | Attending: Ophthalmology | Admitting: Ophthalmology

## 2018-07-06 DIAGNOSIS — H5210 Myopia, unspecified eye: Secondary | ICD-10-CM | POA: Diagnosis not present

## 2018-07-06 DIAGNOSIS — H2512 Age-related nuclear cataract, left eye: Secondary | ICD-10-CM | POA: Diagnosis present

## 2018-07-06 DIAGNOSIS — G4730 Sleep apnea, unspecified: Secondary | ICD-10-CM | POA: Insufficient documentation

## 2018-07-06 DIAGNOSIS — Z79899 Other long term (current) drug therapy: Secondary | ICD-10-CM | POA: Diagnosis not present

## 2018-07-06 DIAGNOSIS — J45909 Unspecified asthma, uncomplicated: Secondary | ICD-10-CM | POA: Insufficient documentation

## 2018-07-06 DIAGNOSIS — H52223 Regular astigmatism, bilateral: Secondary | ICD-10-CM | POA: Insufficient documentation

## 2018-07-06 DIAGNOSIS — Z6841 Body Mass Index (BMI) 40.0 and over, adult: Secondary | ICD-10-CM | POA: Insufficient documentation

## 2018-07-06 DIAGNOSIS — K219 Gastro-esophageal reflux disease without esophagitis: Secondary | ICD-10-CM | POA: Insufficient documentation

## 2018-07-06 HISTORY — PX: CATARACT EXTRACTION W/PHACO: SHX586

## 2018-07-06 SURGERY — PHACOEMULSIFICATION, CATARACT, WITH IOL INSERTION
Anesthesia: Monitor Anesthesia Care | Site: Eye | Laterality: Left

## 2018-07-06 MED ORDER — MIDAZOLAM HCL 5 MG/5ML IJ SOLN
INTRAMUSCULAR | Status: DC | PRN
  Administered 2018-07-06: 2 mg via INTRAVENOUS

## 2018-07-06 MED ORDER — POVIDONE-IODINE 5 % OP SOLN
OPHTHALMIC | Status: DC | PRN
  Administered 2018-07-06: 1 via OPHTHALMIC

## 2018-07-06 MED ORDER — PROVISC 10 MG/ML IO SOLN
INTRAOCULAR | Status: DC | PRN
  Administered 2018-07-06: 0.85 mL via INTRAOCULAR

## 2018-07-06 MED ORDER — TETRACAINE HCL 0.5 % OP SOLN
1.0000 [drp] | OPHTHALMIC | Status: AC
  Administered 2018-07-06 (×3): 1 [drp] via OPHTHALMIC

## 2018-07-06 MED ORDER — EPINEPHRINE PF 1 MG/ML IJ SOLN
INTRAOCULAR | Status: DC | PRN
  Administered 2018-07-06: 500 mL

## 2018-07-06 MED ORDER — PHENYLEPHRINE HCL 2.5 % OP SOLN
1.0000 [drp] | OPHTHALMIC | Status: AC
  Administered 2018-07-06 (×3): 1 [drp] via OPHTHALMIC

## 2018-07-06 MED ORDER — SODIUM HYALURONATE 23 MG/ML IO SOLN
INTRAOCULAR | Status: DC | PRN
  Administered 2018-07-06: 0.6 mL via INTRAOCULAR

## 2018-07-06 MED ORDER — NEOMYCIN-POLYMYXIN-DEXAMETH 3.5-10000-0.1 OP SUSP
OPHTHALMIC | Status: DC | PRN
  Administered 2018-07-06: 2 [drp] via OPHTHALMIC

## 2018-07-06 MED ORDER — CYCLOPENTOLATE-PHENYLEPHRINE 0.2-1 % OP SOLN
1.0000 [drp] | OPHTHALMIC | Status: AC
  Administered 2018-07-06 (×3): 1 [drp] via OPHTHALMIC

## 2018-07-06 MED ORDER — LIDOCAINE HCL (PF) 1 % IJ SOLN
INTRAOCULAR | Status: DC | PRN
  Administered 2018-07-06: 1 mL via OPHTHALMIC

## 2018-07-06 MED ORDER — BSS IO SOLN
INTRAOCULAR | Status: DC | PRN
  Administered 2018-07-06: 15 mL

## 2018-07-06 MED ORDER — LIDOCAINE HCL 3.5 % OP GEL
1.0000 "application " | Freq: Once | OPHTHALMIC | Status: AC
  Administered 2018-07-06: 1 via OPHTHALMIC

## 2018-07-06 MED ORDER — MIDAZOLAM HCL 2 MG/2ML IJ SOLN
INTRAMUSCULAR | Status: AC
  Filled 2018-07-06: qty 2

## 2018-07-06 SURGICAL SUPPLY — 14 items
CLOTH BEACON ORANGE TIMEOUT ST (SAFETY) ×2 IMPLANT
EYE SHIELD UNIVERSAL CLEAR (GAUZE/BANDAGES/DRESSINGS) ×2 IMPLANT
GLOVE BIOGEL PI IND STRL 6.5 (GLOVE) IMPLANT
GLOVE BIOGEL PI IND STRL 7.0 (GLOVE) IMPLANT
GLOVE BIOGEL PI INDICATOR 6.5 (GLOVE) ×2
GLOVE BIOGEL PI INDICATOR 7.0 (GLOVE) ×2
LENS ALC ACRYL/TECN (Ophthalmic Related) ×2 IMPLANT
NDL HYPO 18GX1.5 BLUNT FILL (NEEDLE) IMPLANT
NEEDLE HYPO 18GX1.5 BLUNT FILL (NEEDLE) ×3 IMPLANT
PAD ARMBOARD 7.5X6 YLW CONV (MISCELLANEOUS) ×2 IMPLANT
TAPE SURG TRANSPORE 1 IN (GAUZE/BANDAGES/DRESSINGS) IMPLANT
TAPE SURGICAL TRANSPORE 1 IN (GAUZE/BANDAGES/DRESSINGS) ×2
VISCOELASTIC ADDITIONAL (OPHTHALMIC RELATED) ×2 IMPLANT
WATER STERILE IRR 250ML POUR (IV SOLUTION) ×2 IMPLANT

## 2018-07-06 NOTE — Transfer of Care (Signed)
Immediate Anesthesia Transfer of Care Note  Patient: Angela Chase  Procedure(s) Performed: CATARACT EXTRACTION PHACO AND INTRAOCULAR LENS PLACEMENT (IOC) (Left Eye)  Patient Location:Short stay  Anesthesia Type:MAC  Level of Consciousness: awake, alert , oriented and patient cooperative  Airway & Oxygen Therapy: Patient Spontanous Breathing  Post-op Assessment: Report given to RN and Post -op Vital signs reviewed and stable  Post vital signs: Reviewed and stable  Last Vitals:  Vitals Value Taken Time  BP    Temp    Pulse    Resp    SpO2      Last Pain:  Vitals:   07/06/18 1049  TempSrc: Oral  PainSc: 0-No pain      Patients Stated Pain Goal: 7 ([id number])  Complications: No apparent anesthesia complications

## 2018-07-06 NOTE — H&P (Signed)
The H and P was reviewed and updated. The patient was examined.  No changes were found after exam.  The surgical eye was marked.  

## 2018-07-06 NOTE — Op Note (Signed)
Date of procedure: 07/06/18  Pre-operative diagnosis: Visually significant age-related cataract, Left Eye (H25.12)  Post-operative diagnosis: Visually significant age-related cataract, Left Eye  Procedure: Removal of cataract via phacoemulsification and insertion of intra-ocular lens Johnson and [place]  +17.0D into the capsular bag of the Left Eye  Attending surgeon: Gerda Diss. Phallon Haydu, MD, MA  Anesthesia: MAC, Topical Akten  Complications: None  Estimated Blood Loss: <20m (minimal)  Specimens: None  Implants: As above  Indications:  Visually significant age-related cataract, Left Eye  Procedure:  The patient was seen and identified in the pre-operative area. The operative eye was identified and dilated.  The operative eye was marked.  Topical anesthesia was administered to the operative eye.     The patient was then to the operative suite and placed in the supine position.  A timeout was performed confirming the patient, procedure to be performed, and all other relevant information.   The patient's face was prepped and draped in the usual fashion for intra-ocular surgery.  A lid speculum was placed into the operative eye and the surgical microscope moved into place and focused.  An inferotemporal paracentesis was created using a 20 gauge paracentesis blade.  Shugarcaine was injected into the anterior chamber.  Viscoelastic was injected into the anterior chamber.  A temporal clear-corneal main wound incision was created using a 2.479mmicrokeratome.  A continuous curvilinear capsulorrhexis was initiated using an irrigating cystitome and completed using capsulorrhexis forceps.  Hydrodissection and hydrodeliniation were performed.  Viscoelastic was injected into the anterior chamber.  A phacoemulsification handpiece and a chopper as a second instrument were used to remove the nucleus and epinucleus. The irrigation/aspiration handpiece was used to remove any remaining cortical  material.   The capsular bag was reinflated with viscoelastic, checked, and found to be intact.  The intraocular lens was inserted into the capsular bag and dialed into place using a Kuglen hook.  The irrigation/aspiration handpiece was used to remove any remaining viscoelastic.  The clear corneal wound and paracentesis wounds were then hydrated and checked with Weck-Cels to be watertight.  The lid-speculum and drape was removed, and the patient's face was cleaned with a wet and dry 4x4.  Maxitrol was instilled in the eye before a clear shield was taped over the eye. The patient was taken to the post-operative care unit in good condition, having tolerated the procedure well.  Post-Op Instructions: [facility] and will receive all other orders and instructions.

## 2018-07-06 NOTE — Anesthesia Postprocedure Evaluation (Signed)
Anesthesia Post Note  Patient: Angela Chase  Procedure(s) Performed: CATARACT EXTRACTION PHACO AND INTRAOCULAR LENS PLACEMENT ([place]) (Left Eye)  Patient location during evaluation: Short Stay Anesthesia Type: MAC Level of consciousness: awake and alert and oriented Pain management: pain level controlled Vital Signs Assessment: post-procedure vital signs reviewed and stable Respiratory status: spontaneous breathing Cardiovascular status: stable Postop Assessment: no apparent nausea or vomiting Anesthetic complications: no     Last Vitals:  Vitals:   07/06/18 1049  BP: 114/66  Pulse: 78  Resp: 16  Temp: 36.6 C  SpO2: 96%    Last Pain:  Vitals:   07/06/18 1049  TempSrc: Oral  PainSc: 0-No pain                 [name] A

## 2018-07-06 NOTE — Discharge Instructions (Addendum)
PATIENT INSTRUCTIONS °POST-ANESTHESIA ° °IMMEDIATELY FOLLOWING SURGERY:  Do not drive or operate machinery for the first twenty four hours after surgery.  Do not make any important decisions for twenty four hours after surgery or while taking narcotic pain medications or sedatives.  If you develop intractable nausea and vomiting or a severe headache please notify your doctor immediately. ° °FOLLOW-UP:  Please make an appointment with your surgeon as instructed. You do not need to follow up with anesthesia unless specifically instructed to do so. ° °WOUND CARE INSTRUCTIONS (if applicable):  Keep a dry clean dressing on the anesthesia/puncture wound site if there is drainage.  Once the wound has quit draining you may leave it open to air.  Generally you should leave the bandage intact for twenty four hours unless there is drainage.  If the epidural site drains for more than 36-48 hours please call the anesthesia department. ° °QUESTIONS?:  Please feel free to call your physician or the hospital operator if you have any questions, and they will be happy to assist you.    ° ° °Please discharge patient when stable, will follow up today with Dr. Wrzosek at the [facility] office immediately following discharge.  Leave shield in place until visit.  All paperwork with discharge instructions will be given at the office. ° °

## 2018-07-06 NOTE — Anesthesia Preprocedure Evaluation (Addendum)
Anesthesia Evaluation  Patient identified by MRN, date of birth, ID band Patient awake    Reviewed: Allergy & Precautions, NPO status , Patient's Chart, lab work & pertinent test results  Airway Mallampati: II  TM Distance: >3 FB Neck ROM: Full    Dental no notable dental hx. (+) Teeth Intact   Pulmonary asthma , sleep apnea ,  [place] with CPAP   Pulmonary exam normal breath sounds clear to auscultation       Cardiovascular Exercise Tolerance: Poor negative cardio ROS Normal cardiovascular examII Rhythm:Regular Rate:Normal     Neuro/Psych Anxiety Depression negative neurological ROS  negative psych ROS   GI/Hepatic Neg liver ROS, GERD  Medicated and Controlled,  Endo/Other  Morbid obesity  Renal/GU negative Renal ROS  negative genitourinary   Musculoskeletal  (+) Arthritis ,   Abdominal   Peds negative pediatric ROS (+)  Hematology negative hematology ROS (+)   Anesthesia Other Findings   Reproductive/Obstetrics negative OB ROS                             Anesthesia Physical Anesthesia Plan  ASA: III  Anesthesia Plan: MAC   Post-op Pain Management:    Induction: Intravenous  PONV Risk Score and Plan:   Airway Management Planned: Nasal Cannula  Additional Equipment:   Intra-op Plan:   Post-operative Plan:   Informed Consent: I have reviewed the patients History and Physical, chart, labs and discussed the procedure including the risks, benefits and alternatives for the proposed anesthesia with the patient or authorized representative who has indicated his/her understanding and acceptance.     Dental advisory given  Plan Discussed with: CRNA  Anesthesia Plan Comments: (MAC planned -ga as needed  Second eye )       Anesthesia Quick Evaluation

## 2018-07-06 NOTE — Anesthesia Procedure Notes (Signed)
Procedure Name: MAC Date/Time: 07/06/2018 11:21 AM Performed by: Andree Elk Amy A, CRNA Pre-anesthesia Checklist: Patient identified, Emergency Drugs available, Suction available, Timeout performed and Patient being monitored Patient Re-evaluated:Patient Re-evaluated prior to induction Oxygen Delivery Method: Nasal Cannula

## 2018-07-07 ENCOUNTER — Encounter (HOSPITAL_COMMUNITY): Payer: Self-pay | Admitting: Ophthalmology

## 2018-07-17 LAB — COLOGUARD

## 2018-07-22 ENCOUNTER — Telehealth: Payer: Self-pay

## 2018-07-22 DIAGNOSIS — R195 Other fecal abnormalities: Secondary | ICD-10-CM

## 2018-07-22 LAB — COLOGUARD: Cologuard: POSITIVE — AB

## 2018-07-22 NOTE — Telephone Encounter (Signed)
Spoke with patient and advised her of the positive Cologuard test with verbal understanding. Referral placed to GI for positive Cologuard test.

## 2018-07-22 NOTE — Telephone Encounter (Signed)
GI referral place for positive cologuard test

## 2018-08-03 ENCOUNTER — Other Ambulatory Visit: Payer: Self-pay | Admitting: Family Medicine

## 2018-08-03 ENCOUNTER — Telehealth: Payer: Self-pay | Admitting: *Deleted

## 2018-08-03 MED ORDER — IPRATROPIUM-ALBUTEROL 0.5-2.5 (3) MG/3ML IN SOLN: 3.0000 mL | Freq: Four times a day (QID) | RESPIRATORY_TRACT | 3 refills | Status: DC | PRN

## 2018-08-03 NOTE — Telephone Encounter (Signed)
Pt called stating she had found her nebulizer and needed some meds called in for it to open her lungs up. Stated she has always had a cough and with the pollen it was worse. Also wanted to now if CPAP machines could be ordered through the office.
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
## 2018-08-03 NOTE — Telephone Encounter (Signed)
Med for the nebulizer sent. The cpap will have to come from the dr treating her for sleep apnea. Let me know if she needs another referral

## 2018-08-03 NOTE — Telephone Encounter (Signed)
Only has atrovent on her med list from 2016. Do you want me to send in duoneb? What dosing?

## 2018-08-03 NOTE — Telephone Encounter (Signed)
CPAP machie is from the MD treating her sleep apnea, not from our office, she will need to return there, pls refer if needed, all in one csall, I will sedn duoneb

## 2018-08-04 NOTE — Telephone Encounter (Signed)
Dr she saw was Dr Gloriajean Dell, in 2017, one time only after the sleep study, diagnosed with MILD sleep apnea, should have returned for 3 month f/u not done. Please provide her with the tele number for the Doc, and explain that for correct  And safe management of her sleep apnea, she needs to be in touch with him as he is specifically trained in this area

## 2018-08-04 NOTE — Telephone Encounter (Signed)
What do you want me to tell her with this response?

## 2018-08-05 ENCOUNTER — Encounter (INDEPENDENT_AMBULATORY_CARE_PROVIDER_SITE_OTHER): Payer: Self-pay | Admitting: Internal Medicine

## 2018-08-05 ENCOUNTER — Ambulatory Visit (INDEPENDENT_AMBULATORY_CARE_PROVIDER_SITE_OTHER): Payer: Medicare Other | Admitting: Internal Medicine

## 2018-08-05 ENCOUNTER — Other Ambulatory Visit: Payer: Self-pay

## 2018-08-05 DIAGNOSIS — R195 Other fecal abnormalities: Secondary | ICD-10-CM | POA: Diagnosis not present

## 2018-08-05 NOTE — Progress Notes (Signed)
Subjective:    Patient ID: Angela Chase, female    DOB: [birth year], 73 y.o.   MRN: [id number] Office visit (face to face).  HPI Referred by Cherly Beach NP for positive cologuard.  States she had been drinking Dora which was red.  Her last colonoscopy was greater than 10 yrs ago at [facility] ( I do not see in Epic). There is no family hx of colon cancer. Mother had breast cancer. Brother had leukemia. Father had prostate cancer. There has been no change in her BMs.  Appetite is good. No weight loss. BMs x 1 a day.    Review of Systems Past Medical History:  Diagnosis Date  . Anxiety   . Asthma 2010  . Depression   . GERD (gastroesophageal reflux disease) 1995  . Hoarseness   . Mixed hyperlipidemia   . Obstructive sleep apnea    On CPAP  . Osteoarthritis   . Wears glasses     Past Surgical History:  Procedure Laterality Date  . BREAST BIOPSY Right    Benign  . CATARACT EXTRACTION W/PHACO Right 06/22/2018   Procedure: CATARACT EXTRACTION PHACO AND INTRAOCULAR LENS PLACEMENT (IOC);  Surgeon: Baruch Goldmann, MD;  Location: [facility];  Service: Ophthalmology;  Laterality: Right;  CDE:  14.11  . CATARACT EXTRACTION W/PHACO Left 07/06/2018   Procedure: CATARACT EXTRACTION PHACO AND INTRAOCULAR LENS PLACEMENT (IOC);  Surgeon: Baruch Goldmann, MD;  Location: [facility];  Service: Ophthalmology;  Laterality: Left;  CDE: 10.14  . CHOLECYSTECTOMY  1985  . ROTATOR CUFF REPAIR Right 2005    Allergies  Allergen Reactions  . Cephalexin Hives  . Omeprazole Hives  . Rofecoxib Rash    Current Outpatient Medications on File Prior to Visit  Medication Sig Dispense Refill  . acetaminophen (TYLENOL) 500 MG tablet Take two tablets daily for arthritis pain (Patient taking differently: Take 1,000 mg by mouth every 6 (six) hours as needed for moderate pain. ) 100 tablet 3  . albuterol (PROVENTIL HFA;VENTOLIN HFA) 108 (90 Base) MCG/ACT inhaler Inhale 2 puffs into the lungs every 6 (six) hours as  needed for wheezing or shortness of breath. 1 Inhaler 2  . antiseptic oral rinse (BIOTENE) LIQD 15 mLs by Mouth Rinse route as needed for dry mouth.    . Artificial Tear Ointment (DRY EYES OP) Place 1 drop into both eyes 2 (two) times daily as needed (dry eyes).     Marland Kitchen aspirin 81 MG tablet Take 81 mg by mouth daily.    . Calcium Carb-Cholecalciferol [id number] MG-UNIT CAPS Take one capsule two times daily (Patient taking differently: Take 1 tablet by mouth 2 (two) times daily. Take one capsule two times daily) 60 capsule 11  . Fluticasone-Salmeterol (ADVAIR) 100-50 MCG/DOSE AEPB Inhale 1 puff into the lungs 2 (two) times daily. 1 each 5  . furosemide (LASIX) 40 MG tablet Take 1 tablet (40 mg total) by mouth 2 (two) times daily. (Patient taking differently: Take 40 mg by mouth daily. ) 60 tablet 5  . ipratropium-albuterol (DUONEB) 0.5-2.5 (3) MG/3ML SOLN Take 3 mLs by nebulization every 6 (six) hours as needed. 360 mL 3  . losartan (COZAAR) 25 MG tablet Take 1 tablet (25 mg total) by mouth daily. 90 tablet 3  . Omega-3 300 MG CAPS Take 300 mg by mouth 3 (three) times daily.    . potassium chloride SA (K-DUR,KLOR-CON) 20 MEQ tablet Take 1 tablet (20 mEq total) by mouth 3 (three) times daily. 90 tablet 5  .  ipratropium (ATROVENT) 0.02 % nebulizer solution Take 2.5 mLs (0.5 mg total) by nebulization every 6 (six) hours as needed for wheezing or shortness of breath. Use with albuterol solution 75 mL 12   No current facility-administered medications on file prior to visit.         Objective:   Physical Exam Blood pressure 130/63, pulse (!) 101, temperature 98.4 F (36.9 C), height 5' 5.5" (1.664 m), weight 258 lb 14.4 oz (117.4 kg). Alert and oriented. Skin warm and dry. Oral mucosa is moist.   . Sclera anicteric, conjunctivae is pink. Thyroid not enlarged. No cervical lymphadenopathy. Lungs clear. Heart regular rate and rhythm.  Abdomen is soft. Bowel sounds are positive. No hepatomegaly. No abdominal  masses felt. No tenderness.  No edema to lower extremities.         Assessment & Plan:  Positive cologuard. Colonic neoplasm needs to be ruled out.

## 2018-08-05 NOTE — Patient Instructions (Signed)
The risks of bleeding, perforation and infection were reviewed with patient.  

## 2018-08-05 NOTE — Telephone Encounter (Signed)
His number is [phone]. Called pt no answer

## 2018-08-12 NOTE — Telephone Encounter (Signed)
Unable to reach patient by phone so letter was sent with Dr Halford Chessman contact info

## 2018-08-24 ENCOUNTER — Ambulatory Visit (INDEPENDENT_AMBULATORY_CARE_PROVIDER_SITE_OTHER): Payer: Medicare Other | Admitting: Internal Medicine

## 2018-09-11 ENCOUNTER — Encounter: Payer: Self-pay | Admitting: *Deleted

## 2018-10-05 ENCOUNTER — Telehealth (INDEPENDENT_AMBULATORY_CARE_PROVIDER_SITE_OTHER): Payer: Self-pay | Admitting: *Deleted

## 2018-10-05 NOTE — Telephone Encounter (Signed)
Spoke to patient about scheduling TCS -- she is leary about coming out with COVID and will call me when she is ready to schedule

## 2018-10-12 ENCOUNTER — Encounter: Payer: Medicare Other | Admitting: Family Medicine

## 2018-10-20 ENCOUNTER — Other Ambulatory Visit: Payer: Self-pay | Admitting: Family Medicine

## 2018-10-29 ENCOUNTER — Encounter: Payer: Medicare Other | Admitting: Family Medicine

## 2019-01-14 ENCOUNTER — Other Ambulatory Visit: Payer: Self-pay | Admitting: Family Medicine

## 2019-01-18 ENCOUNTER — Encounter: Payer: Self-pay | Admitting: Family Medicine

## 2019-01-18 ENCOUNTER — Other Ambulatory Visit: Payer: Self-pay

## 2019-01-18 ENCOUNTER — Ambulatory Visit (INDEPENDENT_AMBULATORY_CARE_PROVIDER_SITE_OTHER): Payer: Medicare Other | Admitting: Family Medicine

## 2019-01-18 ENCOUNTER — Encounter (INDEPENDENT_AMBULATORY_CARE_PROVIDER_SITE_OTHER): Payer: Self-pay

## 2019-01-18 ENCOUNTER — Telehealth: Payer: Self-pay | Admitting: Family Medicine

## 2019-01-18 VITALS — BP 136/80 | HR 67 | Temp 97.9°F | Resp 15 | Ht 65.5 in | Wt 240.0 lb

## 2019-01-18 DIAGNOSIS — M25511 Pain in right shoulder: Secondary | ICD-10-CM

## 2019-01-18 DIAGNOSIS — R195 Other fecal abnormalities: Secondary | ICD-10-CM | POA: Diagnosis not present

## 2019-01-18 DIAGNOSIS — Z1231 Encounter for screening mammogram for malignant neoplasm of breast: Secondary | ICD-10-CM

## 2019-01-18 DIAGNOSIS — Z23 Encounter for immunization: Secondary | ICD-10-CM

## 2019-01-18 DIAGNOSIS — Z0000 Encounter for general adult medical examination without abnormal findings: Secondary | ICD-10-CM

## 2019-01-18 DIAGNOSIS — Z0001 Encounter for general adult medical examination with abnormal findings: Secondary | ICD-10-CM

## 2019-01-18 MED ORDER — PREDNISONE 20 MG PO TABS: 20.0000 mg | ORAL_TABLET | Freq: Two times a day (BID) | ORAL | 0 refills | Status: DC

## 2019-01-18 MED ORDER — POTASSIUM CHLORIDE CRYS ER 20 MEQ PO TBCR: EXTENDED_RELEASE_TABLET | ORAL | 5 refills | Status: DC

## 2019-01-18 MED ORDER — METHYLPREDNISOLONE ACETATE 80 MG/ML IJ SUSP
80.0000 mg | Freq: Once | INTRAMUSCULAR | Status: AC
  Administered 2019-01-18: 80 mg via INTRAMUSCULAR

## 2019-01-18 NOTE — Telephone Encounter (Signed)
We need DX Mammo orders entered please.

## 2019-01-18 NOTE — Progress Notes (Signed)
    Angela Chase     MRN: [id number]      DOB: [birth year]  HPI: Patient is in for annual physical exam. C/o pain in right shoulder and right hip increased in past week. States needs safe housing, was living with her Angela Chase who recently passed and she needs to vacate the house Recent labs,are reviewed. Immunization is reviewed , and  updated   PE: BP 136/80   Pulse 67   Temp 97.9 F (36.6 C) (Temporal)   Resp 15   Ht 5' 5.5" (1.664 m)   Wt 240 lb (108.9 kg)   SpO2 98%   BMI 39.33 kg/m   Pleasant  female, alert and oriented x 3, in no cardio-pulmonary distress. Afebrile. HEENT No facial trauma or asymetry. Sinuses non tender.  Extra occullar muscles intact, . External ears normal,. Neck: supple, no adenopathy,JVD or thyromegaly.No bruits.  Chest: Clear to ascultation bilaterally.No crackles or wheezes. Non tender to palpation  Breast: Not examined   Cardiovascular system; Heart sounds normal,  S1 and  S2 ,no S3.  No murmur, or thrill.  Peripheral pulses normal.  Abdomen: Soft, non tender, No guarding, tenderness or rebound.   Musculoskeletal exam: Decreased  ROM of spine, hips , shoulders and knees.Most marked in right shoulder and right hip Deformity ,swelling and  crepitus noted. No muscle wasting or atrophy.   Neurologic: Cranial nerves 2 to 12 intact. Power, tone ,sensation normal throughout. Disturbance in gait. No tremor.  Skin: Intact, no ulceration, erythema , scaling or rash noted. Pigmentation normal throughout  Psych; Normal mood and affect. Judgement and concentration normal   Assessment & Plan:  Shoulder pain, right Uncontrolled.Toradol and depo medrol administered IM in the office , to be followed by a short course of oral prednisone and NSAIDS.   Annual physical exam Annual exam as documented. Counseling done  re healthy lifestyle involving commitment to 150 minutes exercise per week, heart healthy diet, and attaining healthy  weight.The importance of adequate sleep also discussed. Regular seat belt use and home safety, is also discussed. Changes in health habits are decided on by the patient with goals and time frames  set for achieving them. Immunization and cancer screening needs are specifically addressed at this visit.   Need for immunization against influenza After obtaining informed consent, the vaccine is  administered , with no adverse effect noted at the time of administration.   Positive colorectal cancer screening using Cologuard test Needs colonoscopy will refer

## 2019-01-18 NOTE — Patient Instructions (Addendum)
F/u with MD in February, call if you needm me before  Mammogram tio be scheduled at checkout  And let pt know please  Flu vaccine today  Depo medrol 80 mg iM in office today , also 5 days of prednisone prescribed for arthiritc pain in right shoulder and right knee  I will call your cell after I speak with someone at housing authority, with information [phone])

## 2019-01-18 NOTE — Telephone Encounter (Signed)
It has been ordered.

## 2019-01-23 ENCOUNTER — Encounter: Payer: Self-pay | Admitting: Family Medicine

## 2019-01-23 NOTE — Assessment & Plan Note (Signed)
Uncontrolled.Toradol and depo medrol administered IM in the office , to be followed by a short course of oral prednisone and NSAIDS.  

## 2019-01-23 NOTE — Assessment & Plan Note (Signed)
Needs colonoscopy will refer 

## 2019-01-23 NOTE — Assessment & Plan Note (Signed)

## 2019-01-23 NOTE — Assessment & Plan Note (Signed)
After obtaining informed consent, the vaccine is  administered , with no adverse effect noted at the time of administration.  

## 2019-01-28 ENCOUNTER — Other Ambulatory Visit: Payer: Self-pay

## 2019-01-28 ENCOUNTER — Encounter: Payer: Self-pay | Admitting: Family Medicine

## 2019-01-28 ENCOUNTER — Ambulatory Visit: Payer: Medicare Other

## 2019-01-28 ENCOUNTER — Ambulatory Visit (INDEPENDENT_AMBULATORY_CARE_PROVIDER_SITE_OTHER): Payer: Medicare Other | Admitting: Family Medicine

## 2019-01-28 ENCOUNTER — Telehealth: Payer: Self-pay | Admitting: Family Medicine

## 2019-01-28 VITALS — BP 136/80 | HR 67 | Temp 98.8°F | Ht 65.5 in | Wt 240.0 lb

## 2019-01-28 DIAGNOSIS — Z0000 Encounter for general adult medical examination without abnormal findings: Secondary | ICD-10-CM

## 2019-01-28 NOTE — Telephone Encounter (Signed)
Pls contact pt let her know I did reach out to someone at the housing authority re the process for applying for housing  Advise is go to web site [email or web address] and apply, when her applications is processed she will be contacted  Thank you

## 2019-01-28 NOTE — Progress Notes (Signed)
Subjective:   Angela Chase is a 73 y.o. female who presents for Medicare Annual (Subsequent) preventive examination.  Location of Patient: Home Location of Provider: Telehealth Consent was obtain for visit to be over via telehealth. I verified that I am speaking with the correct person using two identifiers.   Review of Systems:   Cardiac Risk Factors include: advanced age (>63men, >18 women);dyslipidemia;sedentary lifestyle;obesity (BMI >30kg/m2)     Objective:     Vitals: There were no vitals taken for this visit.  There is no height or weight on file to calculate BMI.  Advanced Directives 01/28/2019 07/06/2018 06/22/2018 01/26/2018 01/23/2018 06/03/2016 12/17/2014  Does Patient Have a Medical Advance Directive? No No No Yes No No No  Does patient want to make changes to medical advance directive? - - - Yes (ED - Information included in AVS) - - -  Would patient like information on creating a medical advance directive? - No - Patient declined No - Patient declined - - Yes (MAU/Ambulatory/Procedural Areas - Information given) No - patient declined information    Tobacco Social History   Tobacco Use  Smoking Status Never Smoker  Smokeless Tobacco Never Used     Counseling given: Not Answered   Clinical Intake:  Pre-visit preparation completed: No  Pain : No/denies pain Pain Score: 0-No pain     Nutritional Risks: None Diabetes: No  How often do you need to have someone help you when you read instructions, pamphlets, or other written materials from your doctor or pharmacy?: 1 - Never  Interpreter Needed?: No     Past Medical History:  Diagnosis Date  . Anxiety   . Asthma 2010  . Depression   . Falls, sequela 11/30/2016   Fall on 10/24/2016, blunt trauma to low back, hips and knees  . GERD (gastroesophageal reflux disease) 1995  . Hoarseness   . Mixed hyperlipidemia   . Obstructive sleep apnea    On CPAP  . Osteoarthritis   . Wears glasses    Past  Surgical History:  Procedure Laterality Date  . BREAST BIOPSY Right    Benign  . CATARACT EXTRACTION W/PHACO Right 06/22/2018   Procedure: CATARACT EXTRACTION PHACO AND INTRAOCULAR LENS PLACEMENT (IOC);  Surgeon: Baruch Goldmann, MD;  Location: [facility];  Service: Ophthalmology;  Laterality: Right;  CDE:  14.11  . CATARACT EXTRACTION W/PHACO Left 07/06/2018   Procedure: CATARACT EXTRACTION PHACO AND INTRAOCULAR LENS PLACEMENT (IOC);  Surgeon: Baruch Goldmann, MD;  Location: [facility];  Service: Ophthalmology;  Laterality: Left;  CDE: 10.14  . CHOLECYSTECTOMY  1985  . ROTATOR CUFF REPAIR Right 2005   Family History  Problem Relation Age of Onset  . Cancer Father   . Cancer Mother   . Heart attack Mother   . Cancer Brother    Social History   Socioeconomic History  . Marital status: Single    Spouse name: Not on file  . Number of children: Not on file  . Years of education: Not on file  . Highest education level: Not on file  Occupational History  . Occupation: retired  Scientific laboratory technician  . Financial resource strain: Somewhat hard  . Food insecurity    Worry: Never true    Inability: Never true  . Transportation needs    Medical: No    Non-medical: No  Tobacco Use  . Smoking status: Never Smoker  . Smokeless tobacco: Never Used  Substance and Sexual Activity  . Alcohol use: No  .  Drug use: No  . Sexual activity: Not Currently  Lifestyle  . Physical activity    Days per week: 0 days    Minutes per session: 0 min  . Stress: Not on file  Relationships  . Social Herbalist on phone: Twice a week    Gets together: Twice a week    Attends religious service: 1 to 4 times per year    Active member of club or organization: No    Attends meetings of clubs or organizations: Never    Relationship status: Divorced  Other Topics Concern  . Not on file  Social History Narrative  . Not on file    Outpatient Encounter Medications as of 01/28/2019  Medication Sig  .  acetaminophen (TYLENOL) 500 MG tablet Take two tablets daily for arthritis pain (Patient taking differently: Take 1,000 mg by mouth every 6 (six) hours as needed for moderate pain. )  . albuterol (PROVENTIL HFA;VENTOLIN HFA) 108 (90 Base) MCG/ACT inhaler Inhale 2 puffs into the lungs every 6 (six) hours as needed for wheezing or shortness of breath.  Marland Kitchen antiseptic oral rinse (BIOTENE) LIQD 15 mLs by Mouth Rinse route as needed for dry mouth.  . Artificial Tear Ointment (DRY EYES OP) Place 1 drop into both eyes 2 (two) times daily as needed (dry eyes).   Marland Kitchen aspirin 81 MG tablet Take 81 mg by mouth daily.  . Calcium Carb-Cholecalciferol [phone] MG-UNIT CAPS Take one capsule two times daily (Patient taking differently: Take 1 tablet by mouth 2 (two) times daily. Take one capsule two times daily)  . Fluticasone-Salmeterol (ADVAIR) 100-50 MCG/DOSE AEPB Inhale 1 puff into the lungs 2 (two) times daily.  . furosemide (LASIX) 40 MG tablet TAKE 1 TABLET(40 MG) BY MOUTH TWICE DAILY  . ipratropium (ATROVENT) 0.02 % nebulizer solution Take 2.5 mLs (0.5 mg total) by nebulization every 6 (six) hours as needed for wheezing or shortness of breath. Use with albuterol solution  . ipratropium-albuterol (DUONEB) 0.5-2.5 (3) MG/3ML SOLN Take 3 mLs by nebulization every 6 (six) hours as needed.  Marland Kitchen losartan (COZAAR) 25 MG tablet Take 1 tablet (25 mg total) by mouth daily.  . Omega-3 300 MG CAPS Take 300 mg by mouth 3 (three) times daily.  . potassium chloride SA (K-DUR) 20 MEQ tablet TAKE 1 TABLET(20 MEQ) BY MOUTH THREE TIMES DAILY  . predniSONE (DELTASONE) 20 MG tablet Take 1 tablet (20 mg total) by mouth 2 (two) times daily with a meal.   No facility-administered encounter medications on file as of 01/28/2019.     Activities of Daily Living In your present state of health, do you have any difficulty performing the following activities: 01/28/2019 07/06/2018  Hearing? N -  Vision? N -  Difficulty concentrating or making  decisions? N -  Walking or climbing stairs? N -  Comment stairs at times -  Dressing or bathing? N -  Doing errands, shopping? N N  Preparing Food and eating ? N -  Using the Toilet? N -  In the past six months, have you accidently leaked urine? N -  Do you have problems with loss of bowel control? N -  Managing your Medications? N -  Managing your Finances? N -  Housekeeping or managing your Housekeeping? N -  Some recent data might be hidden    Patient Care Team: Fayrene Helper, MD as PCP - General (Family Medicine) Leta Baptist, MD as Consulting Physician (Otolaryngology) Neldon Mc Donnamarie Poag, MD as  Consulting Physician (Allergy and Immunology) Satira Sark, MD as Consulting Physician (Cardiology)    Assessment:   This is a routine wellness examination for [place].  Exercise Activities and Dietary recommendations Current Exercise Habits: The patient does not participate in regular exercise at present, Exercise limited by: orthopedic condition(s)  Goals    . Exercise 3x per week (30 min per time)     Patient would like to start exercising three times a week for at least 30 minutes at a time.       Fall Risk Fall Risk  01/28/2019 01/18/2019 06/08/2018 01/27/2018 01/26/2018  Falls in the past year? 0 0 0 No Yes  Number falls in past yr: 0 0 0 - 2 or more  Injury with Fall? 0 0 0 - -  Risk for fall due to : History of fall(s);Impaired mobility - Impaired mobility;Impaired balance/gait;History of fall(s) - -  Risk for fall due to: Comment - - - - -  Follow up Falls evaluation completed;Education provided - - - -   Is the patient's home free of loose throw rugs in walkways, pet beds, electrical cords, etc?   yes      Grab bars in the bathroom? no      Handrails on the stairs?   yes      Adequate lighting?   yes    Depression Screen PHQ 2/9 Scores 01/28/2019 01/18/2019 06/08/2018 01/27/2018  PHQ - 2 Score 2 3 5 2   PHQ- 9 Score - 5 11 5      Cognitive Function     6CIT  Screen 01/28/2019 01/26/2018 06/03/2016  What Year? 0 points 0 points 0 points  What month? 0 points 0 points 0 points  What time? 0 points 0 points 0 points  Count back from 20 0 points 0 points 0 points  Months in reverse 0 points 0 points 0 points  Repeat phrase 0 points 0 points 0 points  Total Score 0 0 0    Immunization History  Administered Date(s) Administered  . Fluad Quad(high Dose 65+) 01/18/2019  . Influenza Whole 02/26/2006  . Influenza,inj,Quad PF,6+ Mos 01/27/2013, 04/01/2014, 05/16/2015, 05/27/2016, 02/25/2017, 01/06/2018  . Pneumococcal Conjugate-13 07/26/2014  . Pneumococcal Polysaccharide-23 06/17/2013  . Td 12/07/2003  . Tdap 09/15/2014  . Zoster 06/17/2013    Qualifies for Shingles Vaccine? completed  Screening Tests Health Maintenance  Topic Date Due  . COLONOSCOPY  04/27/1997  . MAMMOGRAM  02/04/2020  . TETANUS/TDAP  09/14/2024  . INFLUENZA VACCINE  Completed  . DEXA SCAN  Completed  . Hepatitis C Screening  Completed  . PNA vac Low Risk Adult  Completed    Cancer Screenings: Lung: Low Dose CT Chest recommended if Age 65-80 years, 30 pack-year currently smoking OR have quit w/in 15years. Patient does not qualify. Breast:  Up to date on Mammogram? Yes   Up to date of Bone Density/Dexa? Yes Colorectal:  Scheduled-cologuard + in March   Additional Screenings:  Hepatitis C Screening: completed     Plan:     1. Encounter for Medicare annual wellness exam   I have personally reviewed and noted the following in the patient's chart:   . Medical and social history . Use of alcohol, tobacco or illicit drugs  . Current medications and supplements . Functional ability and status . Nutritional status . Physical activity . Advanced directives . List of other physicians . Hospitalizations, surgeries, and ER visits in previous 12 months . Vitals . Screenings to include  cognitive, depression, and falls . Referrals and appointments  In addition, I  have reviewed and discussed with patient certain preventive protocols, quality metrics, and best practice recommendations. A written personalized care plan for preventive services as well as general preventive health recommendations were provided to patient.    I provided 20 minutes of non-face-to-face time during this encounter.   Perlie Mayo, NP  01/28/2019

## 2019-01-28 NOTE — Patient Instructions (Signed)
Angela Chase , Thank you for taking time to come for your Medicare Wellness Visit. I appreciate your ongoing commitment to your health goals. Please review the following plan we discussed and let me know if I can assist you in the future.   Please continue to practice social distancing to keep you, your family, and our community safe.  If you must go out, please wear a Mask and practice good handwashing.  Screening recommendations/referrals: Colonoscopy: Scheduled Mammogram: up to date Bone Density: up to date Recommended yearly ophthalmology/optometry visit for glaucoma screening and checkup Recommended yearly dental visit for hygiene and checkup  Vaccinations: Influenza vaccine: Due Fall 2021 Pneumococcal vaccine: Completed Tdap vaccine: Due 2026 Shingles vaccine: Completed 2015  Advanced directives:  Send paperwork  Conditions/risks identified: Falls  Next appointment:  Needs appt    Preventive Care 44 Years and Older, Female Preventive care refers to lifestyle choices and visits with your health care provider that can promote health and wellness. What does preventive care include?  A yearly physical exam. This is also called an annual well check.  Dental exams once or twice a year.  Routine eye exams. Ask your health care provider how often you should have your eyes checked.  Personal lifestyle choices, including:  Daily care of your teeth and gums.  Regular physical activity.  Eating a healthy diet.  Avoiding tobacco and drug use.  Limiting alcohol use.  Practicing safe sex.  Taking low-dose aspirin every day.  Taking vitamin and mineral supplements as recommended by your health care provider. What happens during an annual well check? The services and screenings done by your health care provider during your annual well check will depend on your age, overall health, lifestyle risk factors, and family history of disease. Counseling  Your health care  provider may ask you questions about your:  Alcohol use.  Tobacco use.  Drug use.  Emotional well-being.  Home and relationship well-being.  Sexual activity.  Eating habits.  History of falls.  Memory and ability to understand (cognition).  Work and work Statistician.  Reproductive health. Screening  You may have the following tests or measurements:  Height, weight, and BMI.  Blood pressure.  Lipid and cholesterol levels. These may be checked every 5 years, or more frequently if you are over 66 years old.  Skin check.  Lung cancer screening. You may have this screening every year starting at age 83 if you have a 30-pack-year history of smoking and currently smoke or have quit within the past 15 years.  Fecal occult blood test (FOBT) of the stool. You may have this test every year starting at age 60.  Flexible sigmoidoscopy or colonoscopy. You may have a sigmoidoscopy every 5 years or a colonoscopy every 10 years starting at age 68.  Hepatitis C blood test.  Hepatitis B blood test.  Sexually transmitted disease (STD) testing.  Diabetes screening. This is done by checking your blood sugar (glucose) after you have not eaten for a while (fasting). You may have this done every 1-3 years.  Bone density scan. This is done to screen for osteoporosis. You may have this done starting at age 88.  Mammogram. This may be done every 1-2 years. Talk to your health care provider about how often you should have regular mammograms. Talk with your health care provider about your test results, treatment options, and if necessary, the need for more tests. Vaccines  Your health care provider may recommend certain vaccines, such as:  Influenza  vaccine. This is recommended every year.  Tetanus, diphtheria, and acellular pertussis (Tdap, Td) vaccine. You may need a Td booster every 10 years.  Zoster vaccine. You may need this after age 94.  Pneumococcal 13-valent conjugate (PCV13)  vaccine. One dose is recommended after age 17.  Pneumococcal polysaccharide (PPSV23) vaccine. One dose is recommended after age 2. Talk to your health care provider about which screenings and vaccines you need and how often you need them. This information is not intended to replace advice given to you by your health care provider. Make sure you discuss any questions you have with your health care provider. Document Released: 05/12/2015 Document Revised: 01/03/2016 Document Reviewed: 02/14/2015 Elsevier Interactive Patient Education  2017 [place] Prevention in the Home Falls can cause injuries. They can happen to people of all ages. There are many things you can do to make your home safe and to help prevent falls. What can I do on the outside of my home?  Regularly fix the edges of walkways and driveways and fix any cracks.  Remove anything that might make you trip as you walk through a door, such as a raised step or threshold.  Trim any bushes or trees on the path to your home.  Use bright outdoor lighting.  Clear any walking paths of anything that might make someone trip, such as rocks or tools.  Regularly check to see if handrails are loose or broken. Make sure that both sides of any steps have handrails.  Any raised decks and porches should have guardrails on the edges.  Have any leaves, snow, or ice cleared regularly.  Use sand or salt on walking paths during winter.  Clean up any spills in your garage right away. This includes oil or grease spills. What can I do in the bathroom?  Use night lights.  Install grab bars by the toilet and in the tub and shower. Do not use towel bars as grab bars.  Use non-skid mats or decals in the tub or shower.  If you need to sit down in the shower, use a plastic, non-slip stool.  Keep the floor dry. Clean up any water that spills on the floor as soon as it happens.  Remove soap buildup in the tub or shower regularly.   Attach bath mats securely with double-sided non-slip rug tape.  Do not have throw rugs and other things on the floor that can make you trip. What can I do in the bedroom?  Use night lights.  Make sure that you have a light by your bed that is easy to reach.  Do not use any sheets or blankets that are too big for your bed. They should not hang down onto the floor.  Have a firm chair that has side arms. You can use this for support while you get dressed.  Do not have throw rugs and other things on the floor that can make you trip. What can I do in the kitchen?  Clean up any spills right away.  Avoid walking on wet floors.  Keep items that you use a lot in easy-to-reach places.  If you need to reach something above you, use a strong step stool that has a grab bar.  Keep electrical cords out of the way.  Do not use floor polish or wax that makes floors slippery. If you must use wax, use non-skid floor wax.  Do not have throw rugs and other things on the floor that can  make you trip. What can I do with my stairs?  Do not leave any items on the stairs.  Make sure that there are handrails on both sides of the stairs and use them. Fix handrails that are broken or loose. Make sure that handrails are as long as the stairways.  Check any carpeting to make sure that it is firmly attached to the stairs. Fix any carpet that is loose or worn.  Avoid having throw rugs at the top or bottom of the stairs. If you do have throw rugs, attach them to the floor with carpet tape.  Make sure that you have a light switch at the top of the stairs and the bottom of the stairs. If you do not have them, ask someone to add them for you. What else can I do to help prevent falls?  Wear shoes that:  Do not have high heels.  Have rubber bottoms.  Are comfortable and fit you well.  Are closed at the toe. Do not wear sandals.  If you use a stepladder:  Make sure that it is fully opened. Do not climb  a closed stepladder.  Make sure that both sides of the stepladder are locked into place.  Ask someone to hold it for you, if possible.  Clearly mark and make sure that you can see:  Any grab bars or handrails.  First and last steps.  Where the edge of each step is.  Use tools that help you move around (mobility aids) if they are needed. These include:  Canes.  Walkers.  Scooters.  Crutches.  Turn on the lights when you go into a dark area. Replace any light bulbs as soon as they burn out.  Set up your furniture so you have a clear path. Avoid moving your furniture around.  If any of your floors are uneven, fix them.  If there are any pets around you, be aware of where they are.  Review your medicines with your doctor. Some medicines can make you feel dizzy. This can increase your chance of falling. Ask your doctor what other things that you can do to help prevent falls. This information is not intended to replace advice given to you by your health care provider. Make sure you discuss any questions you have with your health care provider. Document Released: 02/09/2009 Document Revised: 09/21/2015 Document Reviewed: 05/20/2014 Elsevier Interactive Patient Education  2017 [employer].

## 2019-01-29 ENCOUNTER — Ambulatory Visit: Payer: Medicare Other

## 2019-02-01 NOTE — Telephone Encounter (Signed)
Called patient to give her the information. No answer and her vm is full. Will try again later.

## 2019-02-02 ENCOUNTER — Telehealth: Payer: Self-pay | Admitting: Family Medicine

## 2019-02-02 NOTE — Telephone Encounter (Signed)
Pt returning your call

## 2019-02-02 NOTE — Telephone Encounter (Signed)
Spoke with patient and gave her the information.

## 2019-02-03 NOTE — Telephone Encounter (Signed)
Spoke with patient and gave her the information regarding housing

## 2019-02-08 ENCOUNTER — Ambulatory Visit: Payer: Medicare Other

## 2019-03-04 ENCOUNTER — Ambulatory Visit (INDEPENDENT_AMBULATORY_CARE_PROVIDER_SITE_OTHER): Payer: Medicare Other | Admitting: Nurse Practitioner

## 2019-03-10 ENCOUNTER — Ambulatory Visit (INDEPENDENT_AMBULATORY_CARE_PROVIDER_SITE_OTHER): Payer: Medicare Other | Admitting: Nurse Practitioner

## 2019-03-15 ENCOUNTER — Ambulatory Visit (HOSPITAL_COMMUNITY)
Admission: RE | Admit: 2019-03-15 | Discharge: 2019-03-15 | Disposition: A | Discharge status: Home / Self Care | Payer: Medicare Other | Source: Ambulatory Visit | Attending: Family Medicine | Admitting: Family Medicine

## 2019-03-15 ENCOUNTER — Other Ambulatory Visit: Payer: Self-pay

## 2019-03-15 DIAGNOSIS — Z1231 Encounter for screening mammogram for malignant neoplasm of breast: Secondary | ICD-10-CM | POA: Insufficient documentation

## 2019-05-03 ENCOUNTER — Telehealth: Payer: Self-pay | Admitting: *Deleted

## 2019-05-03 DIAGNOSIS — E785 Hyperlipidemia, unspecified: Secondary | ICD-10-CM | POA: Diagnosis not present

## 2019-05-03 DIAGNOSIS — G4733 Obstructive sleep apnea (adult) (pediatric): Secondary | ICD-10-CM | POA: Diagnosis not present

## 2019-05-03 DIAGNOSIS — I10 Essential (primary) hypertension: Secondary | ICD-10-CM | POA: Diagnosis not present

## 2019-05-03 DIAGNOSIS — J45909 Unspecified asthma, uncomplicated: Secondary | ICD-10-CM | POA: Diagnosis not present

## 2019-05-03 DIAGNOSIS — E8770 Fluid overload, unspecified: Secondary | ICD-10-CM | POA: Diagnosis not present

## 2019-05-03 DIAGNOSIS — M1990 Unspecified osteoarthritis, unspecified site: Secondary | ICD-10-CM | POA: Diagnosis not present

## 2019-05-03 NOTE — Telephone Encounter (Signed)
Pt would like a copy of her blood work sent to Dr Terrence Dupont to review. This can be faxed to [phone]

## 2019-05-03 NOTE — Telephone Encounter (Signed)
Called patient back to see what labwork patient wanted faxed. No answer. Left generic message requesting call back.

## 2019-05-04 NOTE — Telephone Encounter (Signed)
Labwork from 05/2018 faxed

## 2019-05-10 ENCOUNTER — Telehealth: Payer: Self-pay | Admitting: *Deleted

## 2019-05-10 NOTE — Telephone Encounter (Signed)
Pt wanted a little more information on the covid vaccine before she took it. She can be reached at [phone]

## 2019-05-10 NOTE — Telephone Encounter (Signed)
Called patient and left message to call back.

## 2019-05-12 NOTE — Telephone Encounter (Signed)
Patient aware and more info given

## 2019-05-17 ENCOUNTER — Other Ambulatory Visit: Payer: Self-pay

## 2019-05-17 ENCOUNTER — Ambulatory Visit: Payer: Medicare PPO | Admitting: Family Medicine

## 2019-06-07 ENCOUNTER — Ambulatory Visit: Payer: Medicare Other | Admitting: Family Medicine

## 2019-06-10 DIAGNOSIS — Z961 Presence of intraocular lens: Secondary | ICD-10-CM | POA: Diagnosis not present

## 2019-06-10 DIAGNOSIS — H43813 Vitreous degeneration, bilateral: Secondary | ICD-10-CM | POA: Diagnosis not present

## 2019-06-18 ENCOUNTER — Other Ambulatory Visit: Payer: Self-pay | Admitting: Family Medicine

## 2019-07-01 ENCOUNTER — Ambulatory Visit: Payer: Medicare PPO | Attending: Internal Medicine

## 2019-07-01 DIAGNOSIS — Z23 Encounter for immunization: Secondary | ICD-10-CM

## 2019-07-01 NOTE — Progress Notes (Signed)
   Covid-19 Vaccination Clinic  Name:  Angela Chase    MRN: [id number] DOB: 1945-06-19  07/01/2019  [name] was observed post Covid-19 immunization for 15 minutes without incident. She was provided with Vaccine Information Sheet and instruction to access the V-Safe system.   [name] was instructed to call 911 with any severe reactions post vaccine: Marland Kitchen Difficulty breathing  . Swelling of face and throat  . A fast heartbeat  . A bad rash all over body  . Dizziness and weakness   Immunizations Administered    Name Date Dose VIS Date Route   Pfizer COVID-19 Vaccine 07/01/2019 10:44 AM 0.3 mL 04/09/2019 Intramuscular   Manufacturer: [place]   Lot: [id number]   [place]: [id number]

## 2019-07-13 ENCOUNTER — Ambulatory Visit (INDEPENDENT_AMBULATORY_CARE_PROVIDER_SITE_OTHER): Payer: Medicare Other | Admitting: Internal Medicine

## 2019-07-28 ENCOUNTER — Ambulatory Visit: Payer: Medicare PPO | Attending: Internal Medicine

## 2019-07-28 DIAGNOSIS — Z23 Encounter for immunization: Secondary | ICD-10-CM

## 2019-07-28 NOTE — Progress Notes (Signed)
   Covid-19 Vaccination Clinic  Name:  Angela Chase    MRN: [id number] DOB: Jan 29, 1946  07/28/2019  [name] was observed post Covid-19 immunization for 15 minutes without incident. She was provided with Vaccine Information Sheet and instruction to access the V-Safe system.   [name] was instructed to call 911 with any severe reactions post vaccine: Marland Kitchen Difficulty breathing  . Swelling of face and throat  . A fast heartbeat  . A bad rash all over body  . Dizziness and weakness   Immunizations Administered    Name Date Dose VIS Date Route   Pfizer COVID-19 Vaccine 07/28/2019  8:24 AM 0.3 mL 04/09/2019 Intramuscular   Manufacturer: [place]   Lot: [id number]   [place]: [id number]

## 2019-08-03 DIAGNOSIS — E785 Hyperlipidemia, unspecified: Secondary | ICD-10-CM | POA: Diagnosis not present

## 2019-08-03 DIAGNOSIS — I10 Essential (primary) hypertension: Secondary | ICD-10-CM | POA: Diagnosis not present

## 2019-08-03 DIAGNOSIS — M1990 Unspecified osteoarthritis, unspecified site: Secondary | ICD-10-CM | POA: Diagnosis not present

## 2019-08-03 DIAGNOSIS — R0609 Other forms of dyspnea: Secondary | ICD-10-CM | POA: Diagnosis not present

## 2019-08-03 DIAGNOSIS — G4733 Obstructive sleep apnea (adult) (pediatric): Secondary | ICD-10-CM | POA: Diagnosis not present

## 2019-08-03 DIAGNOSIS — J45909 Unspecified asthma, uncomplicated: Secondary | ICD-10-CM | POA: Diagnosis not present

## 2019-10-18 DIAGNOSIS — R0789 Other chest pain: Secondary | ICD-10-CM | POA: Diagnosis not present

## 2019-10-18 DIAGNOSIS — E785 Hyperlipidemia, unspecified: Secondary | ICD-10-CM | POA: Diagnosis not present

## 2019-10-18 DIAGNOSIS — J45909 Unspecified asthma, uncomplicated: Secondary | ICD-10-CM | POA: Diagnosis not present

## 2019-10-18 DIAGNOSIS — R0600 Dyspnea, unspecified: Secondary | ICD-10-CM | POA: Diagnosis not present

## 2019-10-18 DIAGNOSIS — I10 Essential (primary) hypertension: Secondary | ICD-10-CM | POA: Diagnosis not present

## 2019-11-12 DIAGNOSIS — R0789 Other chest pain: Secondary | ICD-10-CM | POA: Diagnosis not present

## 2019-12-15 ENCOUNTER — Ambulatory Visit: Payer: Medicare PPO | Admitting: Family Medicine

## 2019-12-27 ENCOUNTER — Other Ambulatory Visit: Payer: Self-pay

## 2019-12-27 ENCOUNTER — Encounter: Payer: Self-pay | Admitting: Family Medicine

## 2019-12-27 ENCOUNTER — Ambulatory Visit (INDEPENDENT_AMBULATORY_CARE_PROVIDER_SITE_OTHER): Payer: Medicare PPO | Admitting: Family Medicine

## 2019-12-27 VITALS — BP 120/64 | HR 91 | Temp 98.7°F | Resp 18 | Ht 65.5 in | Wt 234.1 lb

## 2019-12-27 DIAGNOSIS — Z604 Social exclusion and rejection: Secondary | ICD-10-CM

## 2019-12-27 DIAGNOSIS — M170 Bilateral primary osteoarthritis of knee: Secondary | ICD-10-CM

## 2019-12-27 DIAGNOSIS — E785 Hyperlipidemia, unspecified: Secondary | ICD-10-CM

## 2019-12-27 DIAGNOSIS — E559 Vitamin D deficiency, unspecified: Secondary | ICD-10-CM

## 2019-12-27 DIAGNOSIS — Z6841 Body Mass Index (BMI) 40.0 and over, adult: Secondary | ICD-10-CM | POA: Diagnosis not present

## 2019-12-27 DIAGNOSIS — R195 Other fecal abnormalities: Secondary | ICD-10-CM | POA: Diagnosis not present

## 2019-12-27 DIAGNOSIS — G8929 Other chronic pain: Secondary | ICD-10-CM

## 2019-12-27 DIAGNOSIS — Z1211 Encounter for screening for malignant neoplasm of colon: Secondary | ICD-10-CM | POA: Diagnosis not present

## 2019-12-27 DIAGNOSIS — Z1231 Encounter for screening mammogram for malignant neoplasm of breast: Secondary | ICD-10-CM

## 2019-12-27 DIAGNOSIS — J4540 Moderate persistent asthma, uncomplicated: Secondary | ICD-10-CM | POA: Diagnosis not present

## 2019-12-27 DIAGNOSIS — Z23 Encounter for immunization: Secondary | ICD-10-CM | POA: Diagnosis not present

## 2019-12-27 DIAGNOSIS — M25561 Pain in right knee: Secondary | ICD-10-CM

## 2019-12-27 DIAGNOSIS — M25511 Pain in right shoulder: Secondary | ICD-10-CM

## 2019-12-27 DIAGNOSIS — R296 Repeated falls: Secondary | ICD-10-CM

## 2019-12-27 NOTE — Progress Notes (Signed)
° °  Angela Chase     MRN: [id number]      DOB: 03-31-1946   HPI Angela Chase is here for follow up and re-evaluation of chronic medical conditions, medication management and review of any available recent lab and radiology data.  Preventive health is updated, specifically  Cancer screening and Immunization.  needs colonoscopy and flu vaccine Waiting for weight reduction for knee surgery,  has chronic disabling knee pain The PT is currently taking no medications.  Right shoulder pain daily wi th reduced ROM. Remains essentially homeless, I spoke directly with housing authority in her presence, she is to fill out on line application, also will ask social worker to be directly involved through [place] Denies recent fever or chills. Denies sinus pressure, nasal congestion, ear pain or sore throat. Denies chest congestion, productive cough and does have intermittent wheezing. Denies chest pains, palpitations, PND, orthopnea  and leg swelling Denies abdominal pain, nausea, vomiting,diarrhea or constipation.   Denies dysuria, frequency, hesitancy c/o  incontinence. C/o  joint pain, swelling and limitation in mobility. Denies headaches, seizures, numbness, or tingling. Denies depression, anxiety or insomnia. Denies skin break down or rash.   PE  BP 120/64 (BP Location: Right Arm, Patient Position: Sitting, Cuff Size: Normal)    Pulse 91    Temp 98.7 F (37.1 C) (Temporal)    Resp 18    Ht 5' 5.5" (1.664 m)    Wt 234 lb 1.9 oz (106.2 kg)    SpO2 95%    BMI 38.37 kg/m   Patient alert and oriented and in no cardiopulmonary distress.  HEENT: No facial asymmetry, EOMI,     Neck supple .  Chest: Clear to auscultation bilaterally.  CVS: S1, S2 no murmurs, no S3.Regular rate.  ABD: Soft non tender.   Ext: No edema  MS: decreased  ROM spine, shoulders, hips and knees.  Skin: Intact, no ulcerations or rash noted.  Psych: Good eye contact, normal affect. Memory intact not anxious or  depressed appearing.  CNS: CN 2-12 intact, power,  normal throughout.no focal deficits noted.   Assessment & Plan  Need for immunization against influenza After obtaining informed consent, the vaccine is  administered , with no adverse effect noted at the time of administration.   Positive colorectal cancer screening using Cologuard test  Colonoscopy past due, referred again and advised pt of the importance of keeping appt  Hyperlipemia Hyperlipidemia:Low fat diet discussed and encouraged.   Lipid Panel  Lab Results  Component Value Date   CHOL 211 (H) 12/27/2019   HDL 67 12/27/2019   LDLCALC 129 (H) 12/27/2019   TRIG 87 12/27/2019   CHOLHDL 3.1 12/27/2019     Needs to reduce fried and fatty foods  Osteoarthritis of both knees Right greater than left, needs surgery, weight now appropriate will refer to Ortho  Asthma Needs to resume daily preventive and have rescue inhaler available, will prescribe both  Unspecified vitamin D deficiency Corrected on current regime , continue same  Shoulder pain, right Disabling, Ortho will evaluate also, knee more of a priority now  Recurrent falls while walking Home safety reviewed  Social isolation High risk, essentially homeless, no clear social network to support her, Education officer, museum consult

## 2019-12-27 NOTE — Patient Instructions (Addendum)
Annual physical exam with MD in early mNovember, call if you need  Me sooner  Please schedule mammogram at [facility] she has breast cancer history  Flu vaccine today  Labs today, cBC, lipid, cmp and eGFr, tSH, vit D  You DO NEED the colonoscopy and are referred to Dr Laural Golden  We wilkl refer you to [place] work re housing  NEEDS   Please use the website to apply for housing in the city  You are a weight where you can be considered for surgery , keep up the great work  I will refer you to Dr Raphael Gibney again.  Medications prescribed will be for breathing , Albuterol and advair

## 2019-12-28 LAB — CBC
Hematocrit: 44.2 % (ref 34.0–46.6)
Hemoglobin: 13.9 g/dL (ref 11.1–15.9)
MCH: 25.5 pg — ABNORMAL LOW (ref 26.6–33.0)
MCHC: 31.4 g/dL — ABNORMAL LOW (ref 31.5–35.7)
MCV: 81 fL (ref 79–97)
Platelets: 246 10*3/uL (ref 150–450)
RBC: 5.45 x10E6/uL — ABNORMAL HIGH (ref 3.77–5.28)
RDW: 14.5 % (ref 11.7–15.4)
WBC: 5.9 10*3/uL (ref 3.4–10.8)

## 2019-12-28 LAB — CMP14+EGFR
ALT: 13 IU/L (ref 0–32)
AST: 20 IU/L (ref 0–40)
Albumin/Globulin Ratio: 1.3 (ref 1.2–2.2)
Albumin: 4.3 g/dL (ref 3.7–4.7)
Alkaline Phosphatase: 70 IU/L (ref 48–121)
BUN/Creatinine Ratio: 14 (ref 12–28)
BUN: 11 mg/dL (ref 8–27)
Bilirubin Total: 1.1 mg/dL (ref 0.0–1.2)
CO2: 22 mmol/L (ref 20–29)
Calcium: 10 mg/dL (ref 8.7–10.3)
Chloride: 103 mmol/L (ref 96–106)
Creatinine, Ser: 0.77 mg/dL (ref 0.57–1.00)
GFR calc Af Amer: 89 mL/min/{1.73_m2} (ref >59)
GFR calc non Af Amer: 77 mL/min/{1.73_m2} (ref >59)
Globulin, Total: 3.3 g/dL (ref 1.5–4.5)
Glucose: 86 mg/dL (ref 65–99)
Potassium: 3.7 mmol/L (ref 3.5–5.2)
Sodium: 141 mmol/L (ref 134–144)
Total Protein: 7.6 g/dL (ref 6.0–8.5)

## 2019-12-28 LAB — LIPID PANEL
Chol/HDL Ratio: 3.1 ratio (ref 0.0–4.4)
Cholesterol, Total: 211 mg/dL — ABNORMAL HIGH (ref 100–199)
HDL: 67 mg/dL (ref >39)
LDL Chol Calc (NIH): 129 mg/dL — ABNORMAL HIGH (ref 0–99)
Triglycerides: 87 mg/dL (ref 0–149)
VLDL Cholesterol Cal: 15 mg/dL (ref 5–40)

## 2019-12-28 LAB — TSH: TSH: 4.26 u[IU]/mL (ref 0.450–4.500)

## 2019-12-28 LAB — VITAMIN D 25 HYDROXY (VIT D DEFICIENCY, FRACTURES): Vit D, 25-Hydroxy: 25.2 ng/mL — ABNORMAL LOW (ref 30.0–100.0)

## 2020-01-03 ENCOUNTER — Telehealth: Payer: Self-pay | Admitting: Family Medicine

## 2020-01-03 ENCOUNTER — Encounter: Payer: Self-pay | Admitting: Family Medicine

## 2020-01-03 DIAGNOSIS — G4733 Obstructive sleep apnea (adult) (pediatric): Secondary | ICD-10-CM

## 2020-01-03 MED ORDER — ALBUTEROL SULFATE HFA 108 (90 BASE) MCG/ACT IN AERS: 2.0000 | INHALATION_SPRAY | Freq: Four times a day (QID) | RESPIRATORY_TRACT | 0 refills | Status: DC | PRN

## 2020-01-03 MED ORDER — FLUTICASONE-SALMETEROL 100-50 MCG/DOSE IN AEPB: 1.0000 | INHALATION_SPRAY | Freq: Two times a day (BID) | RESPIRATORY_TRACT | 3 refills | Status: DC

## 2020-01-03 NOTE — Assessment & Plan Note (Signed)
High risk, essentially homeless, no clear social network to support her, Education officer, museum consult

## 2020-01-03 NOTE — Assessment & Plan Note (Signed)
Needs to resume daily preventive and have rescue inhaler available, will prescribe both

## 2020-01-03 NOTE — Assessment & Plan Note (Signed)
Disabling, Ortho will evaluate also, knee more of a priority now

## 2020-01-03 NOTE — Assessment & Plan Note (Signed)
Home safety reviewed 

## 2020-01-03 NOTE — Assessment & Plan Note (Signed)
After obtaining informed consent, the vaccine is  administered , with no adverse effect noted at the time of administration.  

## 2020-01-03 NOTE — Telephone Encounter (Signed)
I am asking for help with our patient. She is "essentially homeless" . I called [place] housing in her presence at the visit, she has to go through on line application, not sure she is capable or will do this. I am asking that you contact the social worker in [facility] and see what we can do collectively  to help this lady. Thanks

## 2020-01-03 NOTE — Assessment & Plan Note (Signed)
Right greater than left, needs surgery, weight now appropriate will refer to Ortho

## 2020-01-03 NOTE — Assessment & Plan Note (Signed)
Corrected on current regime , continue same

## 2020-01-03 NOTE — Assessment & Plan Note (Signed)
Hyperlipidemia:Low fat diet discussed and encouraged.   Lipid Panel  Lab Results  Component Value Date   CHOL 211 (H) 12/27/2019   HDL 67 12/27/2019   LDLCALC 129 (H) 12/27/2019   TRIG 87 12/27/2019   CHOLHDL 3.1 12/27/2019     Needs to reduce fried and fatty foods

## 2020-01-03 NOTE — Assessment & Plan Note (Signed)
Colonoscopy past due, referred again and advised pt of the importance of keeping appt

## 2020-01-04 ENCOUNTER — Encounter (INDEPENDENT_AMBULATORY_CARE_PROVIDER_SITE_OTHER): Payer: Self-pay | Admitting: *Deleted

## 2020-01-04 ENCOUNTER — Other Ambulatory Visit: Payer: Self-pay | Admitting: Family Medicine

## 2020-01-04 NOTE — Telephone Encounter (Signed)
They sent info for a homeless shelter. Do you want me to pass that along to the patient? That is the only info that was given to me. They are stating most shelters are at capacity due to [facility]
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
## 2020-01-14 NOTE — Telephone Encounter (Signed)
Referred for thn consult

## 2020-01-14 NOTE — Addendum Note (Signed)
Addended by: Eual Fines on: 01/14/2020 11:08 AM   Modules accepted: Orders

## 2020-01-17 ENCOUNTER — Other Ambulatory Visit: Payer: Self-pay
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
## 2020-01-18 ENCOUNTER — Telehealth: Payer: Self-pay

## 2020-01-18 ENCOUNTER — Other Ambulatory Visit: Payer: Self-pay | Admitting: *Deleted

## 2020-01-18 NOTE — Telephone Encounter (Signed)
Pt wants to know if she is considered severely immunocompromised and if she qualifies for a booster covid vaccine?

## 2020-01-18 NOTE — Telephone Encounter (Signed)
Based on her age she does qualify if she had trhe Estate manager/land agent, still waiting  on guideline re moderna and Ranelle Oyster

## 2020-01-18 NOTE — Telephone Encounter (Signed)
Patient aware.
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
## 2020-01-19 DIAGNOSIS — E785 Hyperlipidemia, unspecified: Secondary | ICD-10-CM | POA: Diagnosis not present

## 2020-01-19 DIAGNOSIS — J45909 Unspecified asthma, uncomplicated: Secondary | ICD-10-CM | POA: Diagnosis not present

## 2020-01-19 DIAGNOSIS — I5030 Unspecified diastolic (congestive) heart failure: Secondary | ICD-10-CM | POA: Diagnosis not present

## 2020-01-19 DIAGNOSIS — I10 Essential (primary) hypertension: Secondary | ICD-10-CM | POA: Diagnosis not present

## 2020-01-20 ENCOUNTER — Ambulatory Visit: Payer: Medicare PPO | Attending: Internal Medicine

## 2020-01-20 ENCOUNTER — Ambulatory Visit: Payer: Medicare PPO

## 2020-01-20 DIAGNOSIS — Z23 Encounter for immunization: Secondary | ICD-10-CM

## 2020-01-20 NOTE — Progress Notes (Signed)
   Covid-19 Vaccination Clinic  Name:  Angela Chase    MRN: [id number] DOB: May 02, 1945  01/20/2020  Angela Chase was observed post Covid-19 immunization for 15 minutes without incident. She was provided with Vaccine Information Sheet and instruction to access the V-Safe system.   Angela Chase was instructed to call 911 with any severe reactions post vaccine: Marland Kitchen Difficulty breathing  . Swelling of face and throat  . A fast heartbeat  . A bad rash all over body  . Dizziness and weakness

## 2020-01-23 ENCOUNTER — Other Ambulatory Visit: Payer: Self-pay | Admitting: *Deleted

## 2020-01-23 ENCOUNTER — Encounter: Payer: Self-pay | Admitting: *Deleted
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
## 2020-01-24 ENCOUNTER — Encounter: Payer: Self-pay | Admitting: *Deleted

## 2020-01-25 ENCOUNTER — Ambulatory Visit: Payer: Medicare PPO | Admitting: *Deleted

## 2020-01-26 ENCOUNTER — Ambulatory Visit: Payer: Self-pay | Admitting: *Deleted

## 2020-01-27 ENCOUNTER — Ambulatory Visit: Payer: Self-pay | Admitting: *Deleted

## 2020-01-28 ENCOUNTER — Other Ambulatory Visit: Payer: Self-pay | Admitting: *Deleted

## 2020-01-28 NOTE — Patient Outreach (Signed)
[facility]) Care Management  01/28/2020  MASHELL SIEBEN 06-Dec-1945 [id number]   CSW attempted to reach pt and was unsuccessful and voicemail was full.  CSW will attempt outreach again in 3-4 business days per policy if no return call is received.   Eduard Clos, MSW, [place] Worker  [place] [phone]

## 2020-02-01 ENCOUNTER — Other Ambulatory Visit: Payer: Self-pay | Admitting: *Deleted

## 2020-02-01 ENCOUNTER — Telehealth: Payer: Medicare PPO | Admitting: Family Medicine

## 2020-02-01 ENCOUNTER — Ambulatory Visit: Payer: Self-pay | Admitting: *Deleted

## 2020-02-01 NOTE — Patient Outreach (Signed)
[facility]) Care Management  02/01/2020  RYA RAUSCH [birth year] [id number]   CSW attempted to reach pt for follow up and was unsuccessful.  CSW was unable to leave voice message due to her voicemail being full. CSW will attempt another outreach call in 3-4 business days.   Eduard Clos, MSW, [place] Worker  [place] [phone]

## 2020-02-04 ENCOUNTER — Other Ambulatory Visit: Payer: Self-pay | Admitting: *Deleted

## 2020-02-04 NOTE — Patient Outreach (Signed)
[facility]) Care Management  02/04/2020  Angela Chase June 17, 1945 [id number]   CSW made contact with pt today who reports she has not been able to work on any of the follow up calls to community resources or her home owners insurance because she has been sick. Pt coughing and states she has bronchitis.  Pt also reports she has had some trouble with her car as well.  CSW provided pt with the names and #'s to the agencies discussed last week with Gabriel [place].  CSW encouraged pt to call them to seek assistance as well as to call about her insurance claim in order to get her home repaired and liveable.    CSW reminded pt to call if needs arise prior to follow up call.   Eduard Clos, MSW, [place] Worker  [place] [phone]

## 2020-02-07 ENCOUNTER — Other Ambulatory Visit: Payer: Self-pay

## 2020-02-07 ENCOUNTER — Telehealth (INDEPENDENT_AMBULATORY_CARE_PROVIDER_SITE_OTHER): Payer: Medicare PPO

## 2020-02-07 ENCOUNTER — Telehealth: Payer: Self-pay

## 2020-02-07 VITALS — BP 120/64 | Ht 65.5 in | Wt 234.0 lb

## 2020-02-07 DIAGNOSIS — Z0000 Encounter for general adult medical examination without abnormal findings: Secondary | ICD-10-CM | POA: Diagnosis not present

## 2020-02-07 DIAGNOSIS — Z1231 Encounter for screening mammogram for malignant neoplasm of breast: Secondary | ICD-10-CM | POA: Diagnosis not present

## 2020-02-07 NOTE — Progress Notes (Signed)
Subjective:   Angela Chase is a 74 y.o. female who presents for Medicare Annual (Subsequent) preventive examination.  Review of Systems    Cardiac Risk Factors include: hypertension     Objective:    Today's Vitals   02/07/20 1057 02/07/20 1059  BP: 120/64   Weight: 234 lb (106.1 kg)   Height: 5' 5.5" (1.664 m)   PainSc: 7  7   PainLoc: Knee Knee   Body mass index is 38.35 kg/m.  Advanced Directives 02/07/2020 01/23/2020 01/23/2018 06/03/2016 12/17/2014 10/16/2014 02/22/2014  Does Patient Have a Medical Advance Directive? - No No No No No No  Would patient like information on creating a medical advance directive? No - Patient declined No - Patient declined - Yes (MAU/Ambulatory/Procedural Areas - Information given) No - patient declined information No - patient declined information -  Some encounter information is confidential and restricted. Go to Review Flowsheets activity to see all data.    Current Medications (verified) Outpatient Encounter Medications as of 02/07/2020  Medication Sig  . acetaminophen (TYLENOL) 500 MG tablet Take two tablets daily for arthritis pain (Patient taking differently: Take 1,000 mg by mouth every 6 (six) hours as needed for moderate pain. )  . albuterol (VENTOLIN HFA) 108 (90 Base) MCG/ACT inhaler INHALE 2 PUFFS INTO THE LUNGS EVERY 6 HOURS AS NEEDED FOR WHEEZING OR SHORTNESS OF BREATH  . antiseptic oral rinse (BIOTENE) LIQD 15 mLs by Mouth Rinse route as needed for dry mouth.  . Artificial Tear Ointment (DRY EYES OP) Place 1 drop into both eyes 2 (two) times daily as needed (dry eyes).   . Calcium Carb-Cholecalciferol [phone] MG-UNIT CAPS Take one capsule two times daily  . Fluticasone-Salmeterol (ADVAIR) 100-50 MCG/DOSE AEPB Inhale 1 puff into the lungs 2 (two) times daily.  . furosemide (LASIX) 40 MG tablet Take 40 mg by mouth daily. prn  . [DISCONTINUED] APPLE CIDER VINEGAR PO Take by mouth.  . [DISCONTINUED] Omega-3 300 MG CAPS Take 300  mg by mouth 3 (three) times daily. (Patient not taking: Reported on 02/07/2020)   No facility-administered encounter medications on file as of 02/07/2020.    Allergies (verified) Cephalexin, Omeprazole, and Rofecoxib   History: Past Medical History:  Diagnosis Date  . Anxiety   . Asthma 2010  . Depression   . Falls, sequela 11/30/2016   Fall on 10/24/2016, blunt trauma to low back, hips and knees  . GERD (gastroesophageal reflux disease) 1995  . Hoarseness   . Mixed hyperlipidemia   . Obstructive sleep apnea    On CPAP  . Osteoarthritis   . Wears glasses    Past Surgical History:  Procedure Laterality Date  . BREAST BIOPSY Right    Benign  . CATARACT EXTRACTION W/PHACO Right 06/22/2018   Procedure: CATARACT EXTRACTION PHACO AND INTRAOCULAR LENS PLACEMENT (IOC);  Surgeon: Baruch Goldmann, MD;  Location: [facility];  Service: Ophthalmology;  Laterality: Right;  CDE:  14.11  . CATARACT EXTRACTION W/PHACO Left 07/06/2018   Procedure: CATARACT EXTRACTION PHACO AND INTRAOCULAR LENS PLACEMENT (IOC);  Surgeon: Baruch Goldmann, MD;  Location: [facility];  Service: Ophthalmology;  Laterality: Left;  CDE: 10.14  . CHOLECYSTECTOMY  1985  . ROTATOR CUFF REPAIR Right 2005   Family History  Problem Relation Age of Onset  . Cancer Father   . Cancer Mother   . Heart attack Mother   . Cancer Brother    Social History   Socioeconomic History  . Marital status: Single  Spouse name: Not on file  . Number of children: 2  . Years of education: 65  . Highest education level: 12th grade  Occupational History  . Occupation: retired  Tobacco Use  . Smoking status: Never Smoker  . Smokeless tobacco: Never Used  Vaping Use  . Vaping Use: Never used  Substance and Sexual Activity  . Alcohol use: No  . Drug use: No  . Sexual activity: Not Currently  Other Topics Concern  . Not on file  Social History Narrative  . Not on file   Social Determinants of Health   Financial Resource Strain: Low Risk    . Difficulty of Paying Living Expenses: Not hard at all  Food Insecurity: No Food Insecurity  . Worried About Charity fundraiser in the Last Year: Never true  . Ran Out of Food in the Last Year: Never true  Transportation Needs: No Transportation Needs  . Lack of Transportation (Medical): No  . Lack of Transportation (Non-Medical): No  Physical Activity: Insufficiently Active  . Days of Exercise per Week: 1 day  . Minutes of Exercise per Session: 10 min  Stress: No Stress Concern Present  . Feeling of Stress : Only a little  Social Connections: Socially Isolated  . Frequency of Communication with Friends and Family: Never  . Frequency of Social Gatherings with Friends and Family: Never  . Attends Religious Services: 1 to 4 times per year  . Active Member of Clubs or Organizations: No  . Attends Archivist Meetings: Never  . Marital Status: Divorced    Tobacco Counseling Counseling given: Not Answered   Clinical Intake:  Pre-visit preparation completed: Yes  Pain : 0-10 Pain Score: 7  Faces Pain Scale: Hurts even more Pain Type: Acute pain Pain Location: Knee Pain Orientation: Right Pain Descriptors / Indicators: Sharp Pain Onset: More than a month ago Pain Frequency: Several days a week  Pain Score: 7 Faces Pain Scale: Hurts even more  BMI - recorded: 38.54 Nutritional Status: BMI > 30  Obese Nutritional Risks: None Diabetes: No  How often do you need to have someone help you when you read instructions, pamphlets, or other written materials from your doctor or pharmacy?: 1 - Never What is the last grade level you completed in school?: Master's Degree in Education  Diabetic?no  Interpreter Needed?: No      Activities of Daily Living In your present state of health, do you have any difficulty performing the following activities: 02/07/2020 01/23/2020  Hearing? N N  Vision? N N  Difficulty concentrating or making decisions? N N  Walking or  climbing stairs? Y N  Dressing or bathing? N N  Doing errands, shopping? N N  Preparing Food and eating ? N N  Using the Toilet? N N  In the past six months, have you accidently leaked urine? N N  Do you have problems with loss of bowel control? N N  Managing your Medications? N N  Managing your Finances? N N  Housekeeping or managing your Housekeeping? N N  Some recent data might be hidden    Patient Care Team: Fayrene Helper, MD as PCP - General (Family Medicine) Leta Baptist, MD as Consulting Physician (Otolaryngology) Neldon Mc Donnamarie Poag, MD as Consulting Physician (Allergy and Immunology) Satira Sark, MD as Consulting Physician (Cardiology) Deirdre Peer, LCSW as [place] any recent [place] you may have received from other than [facility] providers in the  past year (date may be approximate).     Assessment:   This is a routine wellness examination for [place].  Hearing/Vision screen No exam data present  Dietary issues and exercise activities discussed: Current Exercise Habits: The patient does not participate in regular exercise at present, Exercise limited by: orthopedic condition(s)  Goals    . Exercise 3x per week (30 min per time)     Patient would like to start exercising three times a week for at least 30 minutes at a time.    . Weight (lb) < 200 lb (90.7 kg)     Wants to lose 20-30lbs.      Depression Screen PHQ 2/9 Scores 02/07/2020 02/07/2020 01/23/2020 12/27/2019 01/06/2018 10/02/2017 07/16/2017  PHQ - 2 Score 2 2 1 1 2 3 4   PHQ- 9 Score 5 5 - 4 5 12 14   Some encounter information is confidential and restricted. Go to Review Flowsheets activity to see all data.    Fall Risk Fall Risk  02/07/2020 01/23/2020 12/27/2019 01/06/2018 10/02/2017  Falls in the past year? 0 1 0 Yes Yes  Number falls in past yr: 0 0 0 1 1  Injury with Fall? 0 1 0 - -  Risk for fall due to : No Fall Risks History of fall(s);Impaired  balance/gait;Impaired mobility No Fall Risks - -  Risk for fall due to: Comment - - - - -  Follow up Falls evaluation completed Education provided;Falls prevention discussed Falls evaluation completed - -  Some encounter information is confidential and restricted. Go to Review Flowsheets activity to see all data.    Any stairs in or around the home? yes If so, are there any without handrails? no Home free of loose throw rugs in walkways, pet beds, electrical cords, etc? yes  Adequate lighting in your home to reduce risk of falls? Yes   ASSISTIVE DEVICES UTILIZED TO PREVENT FALLS:  Life alert? No  Use of a cane, walker or w/c? No  Grab bars in the bathroom? Yes  Shower chair or bench in shower? Yes  Elevated toilet seat or a handicapped toilet? Yes   TIMED UP AND GO:  Was the test performed? No .  Length of time to ambulate 10 feet: n/a sec.     Cognitive Function:     6CIT Screen 02/07/2020 06/03/2016  What Year? 0 points 0 points  What month? 0 points 0 points  What time? 0 points 0 points  Count back from 20 0 points 0 points  Months in reverse 0 points 0 points  Repeat phrase 0 points 0 points  Total Score 0 0  Some encounter information is confidential and restricted. Go to Review Flowsheets activity to see all data.    Immunizations Immunization History  Administered Date(s) Administered  . Fluad Quad(high Dose 65+) 01/18/2019  . Influenza Whole 02/26/2006  . Influenza,inj,Quad PF,6+ Mos 01/27/2013, 04/01/2014, 05/16/2015, 05/27/2016, 02/25/2017, 01/06/2018, 12/27/2019  . PFIZER SARS-COV-2 Vaccination 07/01/2019, 07/28/2019, 01/20/2020  . Pneumococcal Conjugate-13 07/26/2014  . Pneumococcal Polysaccharide-23 06/17/2013  . Td 12/07/2003  . Tdap 09/15/2014  . Zoster 06/17/2013    TDAP status: Up to date Flu Vaccine status: Declined, Education has been provided regarding the importance of this vaccine but patient still declined. Advised may receive this vaccine  at local pharmacy or Health Dept. Aware to provide a copy of the vaccination record if obtained from local pharmacy or Health Dept. Verbalized acceptance and understanding. Pneumococcal vaccine status: Up to date Covid-19 vaccine status:  Completed vaccines  Qualifies for Shingles Vaccine? Yes   Zostavax completed Yes    Screening Tests Health Maintenance  Topic Date Due  . COLONOSCOPY  Never done  . MAMMOGRAM  03/14/2021  . TETANUS/TDAP  09/14/2024  . INFLUENZA VACCINE  Completed  . DEXA SCAN  Completed  . COVID-19 Vaccine  Completed  . Hepatitis C Screening  Completed  . PNA vac Low Risk Adult  Completed    Health Maintenance  Health Maintenance Due  Topic Date Due  . COLONOSCOPY  Never done    Colorectal cancer screening: Completed pt stated she does not remember when her last one was done. Repeat every 10 years Mammogram status: Completed 03/15/19. Repeat every year Bone Density status: Completed 02/13/18. Results reflect: Bone density results: NORMAL. Repeat every 5 years.  Lung Cancer Screening: (Low Dose CT Chest recommended if Age 70-80 years, 30 pack-year currently smoking OR have quit w/in 15years.) does not qualify.   Lung Cancer Screening Referral: n/a   Additional Screening:  Hepatitis C Screening: does not qualify; Completed n/a   Vision Screening: Recommended annual ophthalmology exams for early detection of glaucoma and other disorders of the eye. Is the patient up to date with their annual eye exam?  Yes  Who is the provider or what is the name of the office in which the patient attends annual eye exams? [facility] If pt is not established with a provider, would they like to be referred to a provider to establish care? n/a.   Dental Screening: Recommended annual dental exams for proper oral hygiene  Community Resource Referral / Chronic Care Management: CRR required this visit?  No   CCM required this visit?  No      Plan:     I have  personally reviewed and noted the following in the patient's chart:   . Medical and social history . Use of alcohol, tobacco or illicit drugs  . Current medications and supplements . Functional ability and status . Nutritional status . Physical activity . Advanced directives . List of other physicians . Hospitalizations, surgeries, and ER visits in previous 12 months . Vitals . Screenings to include cognitive, depression, and falls . Referrals and appointments  In addition, I have reviewed and discussed with patient certain preventive protocols, quality metrics, and best practice recommendations. A written personalized care plan for preventive services as well as general preventive health recommendations were provided to patient.     Laretta Bolster, [place]   29/56/2130   Nurse Notes:

## 2020-02-07 NOTE — Telephone Encounter (Signed)
Pt wants her albuterol inhaler refilled. She also wants neb tubing and albuterol solution sent in for her neb machine as well. She never received her Vitamin D Rx that was to be sent to the pharmacy.  Pt asked if you think her taking Magnesium would benefit her arthritis any?  Please advise.

## 2020-02-08 ENCOUNTER — Other Ambulatory Visit: Payer: Self-pay | Admitting: Family Medicine

## 2020-02-08 MED ORDER — ALBUTEROL SULFATE (2.5 MG/3ML) 0.083% IN NEBU: 2.5000 mg | INHALATION_SOLUTION | Freq: Four times a day (QID) | RESPIRATORY_TRACT | 2 refills | Status: AC | PRN

## 2020-02-08 MED ORDER — ALBUTEROL SULFATE HFA 108 (90 BASE) MCG/ACT IN AERS: 2.0000 | INHALATION_SPRAY | Freq: Four times a day (QID) | RESPIRATORY_TRACT | 5 refills | Status: AC | PRN

## 2020-02-08 NOTE — Telephone Encounter (Signed)
Neb solution and albuterol MDI have both been ordered for 6 monhts. The inhaler had recently already been sent,so please ask her to fill script befoe re shelved.  Please prescribe the tubing required for the machine ?? Please ask

## 2020-02-09 ENCOUNTER — Other Ambulatory Visit: Payer: Self-pay | Admitting: Family Medicine

## 2020-02-09 MED ORDER — FLUTICASONE-SALMETEROL 100-50 MCG/DOSE IN AEPB: 1.0000 | INHALATION_SPRAY | Freq: Two times a day (BID) | RESPIRATORY_TRACT | 3 refills | Status: AC

## 2020-02-09 NOTE — Telephone Encounter (Signed)
Pt informed

## 2020-02-09 NOTE — Telephone Encounter (Signed)
Pt informed. She wanted to know if you could also send her in Advair. She has been out of this for a little while but she wants to have it on hand at home. Please advise.

## 2020-02-09 NOTE — Telephone Encounter (Signed)
Resent , again that had been sent in September, she needs to collect her meds please let her know

## 2020-02-09 NOTE — Progress Notes (Signed)
advair

## 2020-02-11 ENCOUNTER — Other Ambulatory Visit: Payer: Self-pay

## 2020-02-11 NOTE — Progress Notes (Signed)
error 

## 2020-02-15 ENCOUNTER — Other Ambulatory Visit: Payer: Self-pay

## 2020-02-15 DIAGNOSIS — Z1231 Encounter for screening mammogram for malignant neoplasm of breast: Secondary | ICD-10-CM

## 2020-02-18 ENCOUNTER — Other Ambulatory Visit: Payer: Self-pay | Admitting: *Deleted

## 2020-02-18 ENCOUNTER — Ambulatory Visit: Payer: Self-pay | Admitting: *Deleted

## 2020-02-18 NOTE — Patient Outreach (Signed)
[place]) Care Management  02/18/2020  Angela Chase 02/22/1946 [id number]   CSW attempted to reach pt and was unable to speak with her.  CSW left a HIPPA compliant voice message and will attempt outreach again in 3-4 business days per policy    Eduard Clos, MSW, [place] Worker  [place] [phone]

## 2020-02-23 ENCOUNTER — Ambulatory Visit: Payer: Self-pay | Admitting: *Deleted

## 2020-02-24 ENCOUNTER — Other Ambulatory Visit: Payer: Self-pay | Admitting: *Deleted

## 2020-02-24 ENCOUNTER — Ambulatory Visit: Payer: Self-pay | Admitting: *Deleted

## 2020-02-24 NOTE — Patient Outreach (Signed)
[facility]) Care Management  02/24/2020  FATUMA DOWERS 25-Nov-1945 [id number]   CSW attempted a final outreach call to pt and was unable to reach.  CSW will plan to sign off and close referral per policy.  CSW will mail pt a case closure letter and advise PCP and [facility] team of above.   Eduard Clos, MSW, [place] Worker  [place] [phone]

## 2020-02-28 ENCOUNTER — Ambulatory Visit (INDEPENDENT_AMBULATORY_CARE_PROVIDER_SITE_OTHER): Payer: Medicare PPO

## 2020-02-28 ENCOUNTER — Other Ambulatory Visit: Payer: Self-pay

## 2020-02-28 DIAGNOSIS — Z1231 Encounter for screening mammogram for malignant neoplasm of breast: Secondary | ICD-10-CM

## 2020-02-28 DIAGNOSIS — Z0000 Encounter for general adult medical examination without abnormal findings: Secondary | ICD-10-CM

## 2020-02-28 DIAGNOSIS — Z1211 Encounter for screening for malignant neoplasm of colon: Secondary | ICD-10-CM

## 2020-02-28 NOTE — Progress Notes (Signed)
Subjective:   Angela Chase is a 74 y.o. female who presents for Medicare Annual (Subsequent) preventive examination.        Objective:    There were no vitals filed for this visit. There is no height or weight on file to calculate BMI.  Advanced Directives 02/07/2020 02/07/2020 01/23/2020 01/23/2018 06/03/2016 12/17/2014 10/16/2014  Does Patient Have a Medical Advance Directive? No - No No No No No  Would patient like information on creating a medical advance directive? No - Patient declined No - Patient declined No - Patient declined - Yes (MAU/Ambulatory/Procedural Areas - Information given) No - patient declined information No - patient declined information  Some encounter information is confidential and restricted. Go to Review Flowsheets activity to see all data.    Current Medications (verified) Outpatient Encounter Medications as of 02/28/2020  Medication Sig  . acetaminophen (TYLENOL) 500 MG tablet Take two tablets daily for arthritis pain (Patient taking differently: Take 1,000 mg by mouth every 6 (six) hours as needed for moderate pain. )  . albuterol (PROVENTIL) (2.5 MG/3ML) 0.083% nebulizer solution Take 3 mLs (2.5 mg total) by nebulization every 6 (six) hours as needed for wheezing or shortness of breath.  Marland Kitchen albuterol (VENTOLIN HFA) 108 (90 Base) MCG/ACT inhaler Inhale 2 puffs into the lungs every 6 (six) hours as needed for wheezing or shortness of breath.  Marland Kitchen antiseptic oral rinse (BIOTENE) LIQD 15 mLs by Mouth Rinse route as needed for dry mouth.  . Artificial Tear Ointment (DRY EYES OP) Place 1 drop into both eyes 2 (two) times daily as needed (dry eyes).   . Calcium Carb-Cholecalciferol [phone] MG-UNIT CAPS Take one capsule two times daily  . Fluticasone-Salmeterol (ADVAIR) 100-50 MCG/DOSE AEPB Inhale 1 puff into the lungs 2 (two) times daily.  . furosemide (LASIX) 40 MG tablet Take 40 mg by mouth daily. prn   No facility-administered encounter medications on file  as of 02/28/2020.    Allergies (verified) Cephalexin, Omeprazole, and Rofecoxib   History: Past Medical History:  Diagnosis Date  . Anxiety   . Asthma 2010  . Depression   . Falls, sequela 11/30/2016   Fall on 10/24/2016, blunt trauma to low back, hips and knees  . GERD (gastroesophageal reflux disease) 1995  . Hoarseness   . Mixed hyperlipidemia   . Obstructive sleep apnea    On CPAP  . Osteoarthritis   . Wears glasses    Past Surgical History:  Procedure Laterality Date  . BREAST BIOPSY Right    Benign  . CATARACT EXTRACTION W/PHACO Right 06/22/2018   Procedure: CATARACT EXTRACTION PHACO AND INTRAOCULAR LENS PLACEMENT (IOC);  Surgeon: Baruch Goldmann, MD;  Location: [facility];  Service: Ophthalmology;  Laterality: Right;  CDE:  14.11  . CATARACT EXTRACTION W/PHACO Left 07/06/2018   Procedure: CATARACT EXTRACTION PHACO AND INTRAOCULAR LENS PLACEMENT (IOC);  Surgeon: Baruch Goldmann, MD;  Location: [facility];  Service: Ophthalmology;  Laterality: Left;  CDE: 10.14  . CHOLECYSTECTOMY  1985  . ROTATOR CUFF REPAIR Right 2005   Family History  Problem Relation Age of Onset  . Cancer Father   . Cancer Mother   . Heart attack Mother   . Cancer Brother    Social History   Socioeconomic History  . Marital status: Single    Spouse name: Not on file  . Number of children: 2  . Years of education: 57  . Highest education level: 12th grade  Occupational History  . Occupation: retired  Tobacco  Use  . Smoking status: Never Smoker  . Smokeless tobacco: Never Used  Vaping Use  . Vaping Use: Never used  Substance and Sexual Activity  . Alcohol use: No  . Drug use: No  . Sexual activity: Not Currently  Other Topics Concern  . Not on file  Social History Narrative  . Not on file   Social Determinants of Health   Financial Resource Strain: Low Risk   . Difficulty of Paying Living Expenses: Not hard at all  Food Insecurity: No Food Insecurity  . Worried About Charity fundraiser in  the Last Year: Never true  . Ran Out of Food in the Last Year: Never true  Transportation Needs: No Transportation Needs  . Lack of Transportation (Medical): No  . Lack of Transportation (Non-Medical): No  Physical Activity: Insufficiently Active  . Days of Exercise per Week: 1 day  . Minutes of Exercise per Session: 10 min  Stress: No Stress Concern Present  . Feeling of Stress : Only a little  Social Connections: Socially Isolated  . Frequency of Communication with Friends and Family: Never  . Frequency of Social Gatherings with Friends and Family: Never  . Attends Religious Services: 1 to 4 times per year  . Active Member of Clubs or Organizations: No  . Attends Archivist Meetings: Never  . Marital Status: Divorced    Tobacco Counseling Counseling given: Not Answered                  Diabetic? No          Activities of Daily Living In your present state of health, do you have any difficulty performing the following activities: 02/07/2020 01/23/2020  Hearing? N N  Vision? N N  Difficulty concentrating or making decisions? N N  Walking or climbing stairs? Y N  Dressing or bathing? N N  Doing errands, shopping? N N  Preparing Food and eating ? N N  Using the Toilet? N N  In the past six months, have you accidently leaked urine? N N  Do you have problems with loss of bowel control? N N  Managing your Medications? N N  Managing your Finances? N N  Housekeeping or managing your Housekeeping? N N  Some recent data might be hidden    Patient Care Team: Fayrene Helper, MD as PCP - General (Family Medicine) Leta Baptist, MD as Consulting Physician (Otolaryngology) Neldon Mc Donnamarie Poag, MD as Consulting Physician (Allergy and Immunology) Satira Sark, MD as Consulting Physician (Cardiology)  Indicate any recent Medical Services you may have received from other than [facility] providers in the past year (date may be approximate).     Assessment:   This  is a routine wellness examination for [place].  Hearing/Vision screen No exam data present  Dietary issues and exercise activities discussed:    Goals    . Exercise 3x per week (30 min per time)     Patient would like to start exercising three times a week for at least 30 minutes at a time.    . Weight (lb) < 200 lb (90.7 kg)     Wants to lose 20-30lbs.      Depression Screen PHQ 2/9 Scores 02/07/2020 02/07/2020 01/23/2020 12/27/2019 01/06/2018 10/02/2017 07/16/2017  PHQ - 2 Score 2 2 1 1 2 3 4   PHQ- 9 Score 5 5 - 4 5 12 14   Some encounter information is confidential and restricted. Go to Review Flowsheets activity to  see all data.    Fall Risk Fall Risk  02/07/2020 02/07/2020 02/07/2020 01/23/2020 12/27/2019  Falls in the past year? 1 0 0 1 0  Number falls in past yr: 0 0 0 0 0  Injury with Fall? 0 0 0 1 0  Risk for fall due to : Impaired balance/gait;History of fall(s) Impaired balance/gait No Fall Risks History of fall(s);Impaired balance/gait;Impaired mobility No Fall Risks  Risk for fall due to: Comment - - - - -  Follow up Falls evaluation completed Falls evaluation completed Falls evaluation completed Education provided;Falls prevention discussed Falls evaluation completed  Some encounter information is confidential and restricted. Go to Review Flowsheets activity to see all data.    Any stairs in or around the home? No  If so, are there any without handrails? No  Home free of loose throw rugs in walkways, pet beds, electrical cords, etc? Yes  Adequate lighting in your home to reduce risk of falls? Yes   ASSISTIVE DEVICES UTILIZED TO PREVENT FALLS:  Life alert? No  Use of a cane, walker or w/c? No  Grab bars in the bathroom? No  Shower chair or bench in shower? No  Elevated toilet seat or a handicapped toilet? No   TIMED UP AND GO:  Was the test performed? No .    Cognitive Function:     6CIT Screen 02/07/2020 06/03/2016  What Year? 0 points 0 points  What month?  0 points 0 points  What time? 0 points 0 points  Count back from 20 0 points 0 points  Months in reverse 0 points 0 points  Repeat phrase 0 points 0 points  Total Score 0 0  Some encounter information is confidential and restricted. Go to Review Flowsheets activity to see all data.    Immunizations Immunization History  Administered Date(s) Administered  . Fluad Quad(high Dose 65+) 01/18/2019  . Influenza Whole 02/26/2006  . Influenza,inj,Quad PF,6+ Mos 01/27/2013, 04/01/2014, 05/16/2015, 05/27/2016, 02/25/2017, 01/06/2018, 12/27/2019  . PFIZER SARS-COV-2 Vaccination 07/01/2019, 07/28/2019, 01/20/2020  . Pneumococcal Conjugate-13 07/26/2014  . Pneumococcal Polysaccharide-23 06/17/2013  . Td 12/07/2003  . Tdap 09/15/2014  . Zoster 06/17/2013    TDAP status: Up to date Flu Vaccine status: Up to date Pneumococcal vaccine status: Up to date Covid-19 vaccine status: Completed vaccines  Qualifies for Shingles Vaccine? Yes   Zostavax completed No   Shingrix Completed?: No.    Education has been provided regarding the importance of this vaccine. Patient has been advised to call insurance company to determine out of pocket expense if they have not yet received this vaccine. Advised may also receive vaccine at local pharmacy or Health Dept. Verbalized acceptance and understanding.  Screening Tests Health Maintenance  Topic Date Due  . COLONOSCOPY  Never done  . MAMMOGRAM  03/14/2021  . TETANUS/TDAP  09/14/2024  . INFLUENZA VACCINE  Completed  . DEXA SCAN  Completed  . COVID-19 Vaccine  Completed  . Hepatitis C Screening  Completed  . PNA vac Low Risk Adult  Completed    Health Maintenance  Health Maintenance Due  Topic Date Due  . COLONOSCOPY  Never done    Colorectal cancer screening: Referral to GI placed to [facility] . Pt aware the office will call re: appt. Mammogram status: Completed normal. Repeat every year Bone Density Status: Complete   Lung Cancer Screening: (Low  Dose CT Chest recommended if Age 20-80 years, 30 pack-year currently smoking OR have quit w/in 15years.) does not qualify.  Additional Screening:  Hepatitis C Screening: does qualify; Completed.  Vision Screening: Recommended annual ophthalmology exams for early detection of glaucoma and other disorders of the eye. Is the patient up to date with their annual eye exam?  Yes    Who is the provider or what is the name of the office in which the patient attends annual eye exams? [facility]  If pt is not established with a provider, would they like to be referred to a provider to establish care? No .   Dental Screening: Recommended annual dental exams for proper oral hygiene  Community Resource Referral / Chronic Care Management: CRR required this visit?  No   CCM required this visit?  No      Plan:     I have personally reviewed and noted the following in the patient's chart:   . Medical and social history . Use of alcohol, tobacco or illicit drugs  . Current medications and supplements . Functional ability and status . Nutritional status . Physical activity . Advanced directives . List of other physicians . Hospitalizations, surgeries, and ER visits in previous 12 months . Vitals . Screenings to include cognitive, depression, and falls . Referrals and appointments  In addition, I have reviewed and discussed with patient certain preventive protocols, quality metrics, and best practice recommendations. A written personalized care plan for preventive services as well as general preventive health recommendations were provided to patient.     Lonn Georgia, LPN   [id number]   Nurse Notes: AWV conducted over the phone with pt consent to televisit via audio. Phone call lasted approx. 60min with pt in home and provider in the office. Pt is up to date with most health maintenance care gaps and does want to proceed with referral to GI for colonoscopy. Pt verbalized pain  in the L hip and leg, pt had fall 2 weeks ago and does have upcoming appt.

## 2020-02-28 NOTE — Patient Instructions (Signed)
Angela Chase , Thank you for taking time to come for your Medicare Wellness Visit. I appreciate your ongoing commitment to your health goals. Please review the following plan we discussed and let me know if I can assist you in the future.   Screening recommendations/referrals: Colonoscopy: DUE - referral sent to Dr. Laural Golden.  Mammogram: SCHEDULED FOR 03/13/20 @ 10:15  Bone Density: COMPLETE   Recommended yearly ophthalmology/optometry visit for glaucoma screening and checkup Recommended yearly dental visit for hygiene and checkup  Vaccinations: Influenza vaccine: COMPLETE Pneumococcal vaccine: COMPLETE  Tdap vaccine: COMPLETE  Shingles vaccine: EDUCATION PROVIDED.     Advanced directives: N/A   Conditions/risks identified: PT HAD A FALL 2 WEEKS AGO AND WOULD LIKE L HIP AND LEG EVALUATED AT UPCOMING OV.   Next appointment: 03/07/2020 @ 10:40 WITH DR. Moshe Cipro    Preventive Care 65 Years and Older, Female Preventive care refers to lifestyle choices and visits with your health care provider that can promote health and wellness. What does preventive care include?  A yearly physical exam. This is also called an annual well check.  Dental exams once or twice a year.  Routine eye exams. Ask your health care provider how often you should have your eyes checked.  Personal lifestyle choices, including:  Daily care of your teeth and gums.  Regular physical activity.  Eating a healthy diet.  Avoiding tobacco and drug use.  Limiting alcohol use.  Practicing safe sex.  Taking low-dose aspirin every day.  Taking vitamin and mineral supplements as recommended by your health care provider. What happens during an annual well check? The services and screenings done by your health care provider during your annual well check will depend on your age, overall health, lifestyle risk factors, and family history of disease. Counseling  Your health care provider may ask you questions about  your:  Alcohol use.  Tobacco use.  Drug use.  Emotional well-being.  Home and relationship well-being.  Sexual activity.  Eating habits.  History of falls.  Memory and ability to understand (cognition).  Work and work Statistician.  Reproductive health. Screening  You may have the following tests or measurements:  Height, weight, and BMI.  Blood pressure.  Lipid and cholesterol levels. These may be checked every 5 years, or more frequently if you are over 97 years old.  Skin check.  Chase cancer screening. You may have this screening every year starting at age 37 if you have a 30-pack-year history of smoking and currently smoke or have quit within the past 15 years.  Fecal occult blood test (FOBT) of the stool. You may have this test every year starting at age 40.  Flexible sigmoidoscopy or colonoscopy. You may have a sigmoidoscopy every 5 years or a colonoscopy every 10 years starting at age 71.  Hepatitis C blood test.  Hepatitis B blood test.  Sexually transmitted disease (STD) testing.  Diabetes screening. This is done by checking your blood sugar (glucose) after you have not eaten for a while (fasting). You may have this done every 1-3 years.  Bone density scan. This is done to screen for osteoporosis. You may have this done starting at age 59.  Mammogram. This may be done every 1-2 years. Talk to your health care provider about how often you should have regular mammograms. Talk with your health care provider about your test results, treatment options, and if necessary, the need for more tests. Vaccines  Your health care provider may recommend certain vaccines, such as:  Influenza vaccine. This is recommended every year.  Tetanus, diphtheria, and acellular pertussis (Tdap, Td) vaccine. You may need a Td booster every 10 years.  Zoster vaccine. You may need this after age 73.  Pneumococcal 13-valent conjugate (PCV13) vaccine. One dose is recommended  after age 68.  Pneumococcal polysaccharide (PPSV23) vaccine. One dose is recommended after age 75. Talk to your health care provider about which screenings and vaccines you need and how often you need them. This information is not intended to replace advice given to you by your health care provider. Make sure you discuss any questions you have with your health care provider. Document Released: 05/12/2015 Document Revised: 01/03/2016 Document Reviewed: 02/14/2015 Elsevier Interactive Patient Education  2017 [employer] Prevention in the Home Falls can cause injuries. They can happen to people of all ages. There are many things you can do to make your home safe and to help prevent falls. What can I do on the outside of my home?  Regularly fix the edges of walkways and driveways and fix any cracks.  Remove anything that might make you trip as you walk through a door, such as a raised step or threshold.  Trim any bushes or trees on the path to your home.  Use bright outdoor lighting.  Clear any walking paths of anything that might make someone trip, such as rocks or tools.  Regularly check to see if handrails are loose or broken. Make sure that both sides of any steps have handrails.  Any raised decks and porches should have guardrails on the edges.  Have any leaves, snow, or ice cleared regularly.  Use sand or salt on walking paths during winter.  Clean up any spills in your garage right away. This includes oil or grease spills. What can I do in the bathroom?  Use night lights.  Install grab bars by the toilet and in the tub and shower. Do not use towel bars as grab bars.  Use non-skid mats or decals in the tub or shower.  If you need to sit down in the shower, use a plastic, non-slip stool.  Keep the floor dry. Clean up any water that spills on the floor as soon as it happens.  Remove soap buildup in the tub or shower regularly.  Attach bath mats securely with  double-sided non-slip rug tape.  Do not have throw rugs and other things on the floor that can make you trip. What can I do in the bedroom?  Use night lights.  Make sure that you have a light by your bed that is easy to reach.  Do not use any sheets or blankets that are too big for your bed. They should not hang down onto the floor.  Have a firm chair that has side arms. You can use this for support while you get dressed.  Do not have throw rugs and other things on the floor that can make you trip. What can I do in the kitchen?  Clean up any spills right away.  Avoid walking on wet floors.  Keep items that you use a lot in easy-to-reach places.  If you need to reach something above you, use a strong step stool that has a grab bar.  Keep electrical cords out of the way.  Do not use floor polish or wax that makes floors slippery. If you must use wax, use non-skid floor wax.  Do not have throw rugs and other things on the floor that  can make you trip. What can I do with my stairs?  Do not leave any items on the stairs.  Make sure that there are handrails on both sides of the stairs and use them. Fix handrails that are broken or loose. Make sure that handrails are as long as the stairways.  Check any carpeting to make sure that it is firmly attached to the stairs. Fix any carpet that is loose or worn.  Avoid having throw rugs at the top or bottom of the stairs. If you do have throw rugs, attach them to the floor with carpet tape.  Make sure that you have a light switch at the top of the stairs and the bottom of the stairs. If you do not have them, ask someone to add them for you. What else can I do to help prevent falls?  Wear shoes that:  Do not have high heels.  Have rubber bottoms.  Are comfortable and fit you well.  Are closed at the toe. Do not wear sandals.  If you use a stepladder:  Make sure that it is fully opened. Do not climb a closed stepladder.  Make  sure that both sides of the stepladder are locked into place.  Ask someone to hold it for you, if possible.  Clearly mark and make sure that you can see:  Any grab bars or handrails.  First and last steps.  Where the edge of each step is.  Use tools that help you move around (mobility aids) if they are needed. These include:  Canes.  Walkers.  Scooters.  Crutches.  Turn on the lights when you go into a dark area. Replace any light bulbs as soon as they burn out.  Set up your furniture so you have a clear path. Avoid moving your furniture around.  If any of your floors are uneven, fix them.  If there are any pets around you, be aware of where they are.  Review your medicines with your doctor. Some medicines can make you feel dizzy. This can increase your chance of falling. Ask your doctor what other things that you can do to help prevent falls. This information is not intended to replace advice given to you by your health care provider. Make sure you discuss any questions you have with your health care provider. Document Released: 02/09/2009 Document Revised: 09/21/2015 Document Reviewed: 05/20/2014 Elsevier Interactive Patient Education  2017 [employer].

## 2020-02-29 ENCOUNTER — Encounter (INDEPENDENT_AMBULATORY_CARE_PROVIDER_SITE_OTHER): Payer: Self-pay | Admitting: *Deleted

## 2020-03-07 ENCOUNTER — Other Ambulatory Visit: Payer: Self-pay | Admitting: Family Medicine

## 2020-03-07 ENCOUNTER — Ambulatory Visit (HOSPITAL_COMMUNITY)
Admission: RE | Admit: 2020-03-07 | Discharge: 2020-03-07 | Disposition: A | Discharge status: Home / Self Care | Payer: Medicare PPO | Source: Ambulatory Visit | Attending: Family Medicine | Admitting: Family Medicine

## 2020-03-07 ENCOUNTER — Other Ambulatory Visit: Payer: Self-pay

## 2020-03-07 ENCOUNTER — Ambulatory Visit (INDEPENDENT_AMBULATORY_CARE_PROVIDER_SITE_OTHER): Payer: Medicare PPO | Admitting: Family Medicine

## 2020-03-07 ENCOUNTER — Encounter: Payer: Self-pay | Admitting: Family Medicine

## 2020-03-07 VITALS — BP 128/80 | HR 72 | Resp 16 | Ht 66.0 in | Wt 232.0 lb

## 2020-03-07 DIAGNOSIS — M25552 Pain in left hip: Secondary | ICD-10-CM

## 2020-03-07 DIAGNOSIS — Z0001 Encounter for general adult medical examination with abnormal findings: Secondary | ICD-10-CM | POA: Diagnosis not present

## 2020-03-07 DIAGNOSIS — Z6841 Body Mass Index (BMI) 40.0 and over, adult: Secondary | ICD-10-CM | POA: Diagnosis not present

## 2020-03-07 DIAGNOSIS — R296 Repeated falls: Secondary | ICD-10-CM

## 2020-03-07 MED ORDER — METHYLPREDNISOLONE ACETATE 80 MG/ML IJ SUSP
80.0000 mg | Freq: Once | INTRAMUSCULAR | Status: AC
  Administered 2020-03-07: 80 mg via INTRAMUSCULAR

## 2020-03-07 MED ORDER — PREDNISONE 10 MG PO TABS: 10.0000 mg | ORAL_TABLET | Freq: Two times a day (BID) | ORAL | 0 refills | Status: DC

## 2020-03-07 NOTE — Progress Notes (Signed)
    Angela Chase     MRN: [id number]      DOB: 1945/06/16  HPI: Patient is in for annual physical exam. C/o increased and uncontrolled left hip pain in past 1 to 2 weeks, fell several weeks ago, increased difficulty with ambulation Recent labs, if available are reviewed. Immunization is reviewed , and  updated if needed.   PE: BP 128/80   Pulse 72   Resp 16   Ht 5\' 6"  (1.676 m)   Wt 232 lb (105.2 kg)   SpO2 98%   BMI 37.45 kg/m   Pleasant  female, alert and oriented x 3, in no cardio-pulmonary distress. Afebrile. HEENT No facial trauma or asymetry. Sinuses non tender.  Extra occullar muscles intact.. External ears normal, . Neck: supple, no adenopathy,JVD or thyromegaly.No bruits.  Chest: Clear to ascultation bilaterally.No crackles or wheezes. Non tender to palpation  Breast: Asymptomatic, normal mammogram in 02/2019, update mammogram, not examined  Cardiovascular system; Heart sounds normal,  S1 and  S2 ,no S3.  No murmur, or thrill. Apical beat not displaced Peripheral pulses normal.  Abdomen: Soft, non tender,. No guarding, tenderness or rebound.      Musculoskeletal exam: Decreased  ROM of spine, hips , shoulders and knees.  deformity ,swelling or crepitus noted. No muscle wasting or atrophy.   Neurologic: Cranial nerves 2 to 12 intact. Power, tone ,sensation normal throughout.  disturbance in gait. No tremor.  Skin: Intact, no ulceration, erythema , scaling or rash noted. Pigmentation normal throughout  Psych; Normal mood and affect. Judgement and concentration normal   Assessment & Plan:  Encounter for annual general medical examination with abnormal findings in adult Annual exam as documented. Counseling done  re healthy lifestyle involving commitment to 150 minutes exercise per week, heart healthy diet, and attaining healthy weight.The importance of adequate sleep also discussed. Regular seat belt use and home safety, is also  discussed. Changes in health habits are decided on by the patient with goals and time frames  set for achieving them. Immunization and cancer screening needs are specifically addressed at this visit.   Left hip pain Increased and uncontrolled Uncontrolled.Toradol and depo medrol administered IM in the office , to be followed by a short course of oral prednisone  X ray left hip  Recurrent falls while walking Home safety and fall risk reduction discussed, has upcoming Ortho appt, severe osteoarthritis and morbid obesity place her at high fall risk also h/o recurrent falls   Morbid obesity with BMI of 40.0-44.9, adult  Patient re-educated about  the importance of commitment to a  minimum of 150 minutes of exercise per week as able.  The importance of healthy food choices with portion control discussed, as well as eating regularly and within a 12 hour window most days. The need to choose "clean , green" food 50 to 75% of the time is discussed, as well as to make water the primary drink and set a goal of 64 ounces water daily.    Weight /BMI 03/07/2020 02/07/2020 12/27/2019  WEIGHT 232 lb 234 lb 234 lb 1.9 oz  HEIGHT 5\' 6"  5' 5.5" 5' 5.5"  BMI 37.45 kg/m2 38.35 kg/m2 38.37 kg/m2  Some encounter information is confidential and restricted. Go to Review Flowsheets activity to see all data.

## 2020-03-07 NOTE — Patient Instructions (Addendum)
F/U in  Office with MD in 5 months, call if you need me sooner  Depo medrol 80  mg IM in the office today and 5 days of prednisone are prescribed for right hip pain  Please get X ray of left hip today  Please fax most recent labs per pt request to Cardiology Dr Terrence Dupont, states she saw him this year  Be careful not to fall  Mammogram is scheduled for 03/13/2020 at 10:15 am  Scheduling/ referral staff,PLEASE follow up on colonoscopy which is  needed, date /appopintment info for patient currently phone does not work

## 2020-03-08 ENCOUNTER — Encounter: Payer: Self-pay | Admitting: Family Medicine

## 2020-03-08 DIAGNOSIS — Z0001 Encounter for general adult medical examination with abnormal findings: Secondary | ICD-10-CM | POA: Insufficient documentation

## 2020-03-08 DIAGNOSIS — M25552 Pain in left hip: Secondary | ICD-10-CM | POA: Insufficient documentation

## 2020-03-08 NOTE — Assessment & Plan Note (Signed)

## 2020-03-08 NOTE — Assessment & Plan Note (Addendum)
Increased and uncontrolled Uncontrolled.Toradol and depo medrol administered IM in the office , to be followed by a short course of oral prednisone  X ray left hip

## 2020-03-08 NOTE — Assessment & Plan Note (Signed)
Home safety and fall risk reduction discussed, has upcoming Ortho appt, severe osteoarthritis and morbid obesity place her at high fall risk also h/o recurrent falls

## 2020-03-08 NOTE — Assessment & Plan Note (Signed)
  Patient re-educated about  the importance of commitment to a  minimum of 150 minutes of exercise per week as able.  The importance of healthy food choices with portion control discussed, as well as eating regularly and within a 12 hour window most days. The need to choose "clean , green" food 50 to 75% of the time is discussed, as well as to make water the primary drink and set a goal of 64 ounces water daily.    Weight /BMI 03/07/2020 02/07/2020 12/27/2019  WEIGHT 232 lb 234 lb 234 lb 1.9 oz  HEIGHT 5\' 6"  5' 5.5" 5' 5.5"  BMI 37.45 kg/m2 38.35 kg/m2 38.37 kg/m2  Some encounter information is confidential and restricted. Go to Review Flowsheets activity to see all data.

## 2020-03-13 ENCOUNTER — Inpatient Hospital Stay (HOSPITAL_COMMUNITY): Admission: RE | Admit: 2020-03-13 | Payer: Medicare PPO | Source: Ambulatory Visit

## 2020-03-14 ENCOUNTER — Telehealth: Payer: Self-pay

## 2020-03-14 NOTE — Telephone Encounter (Signed)
Patient lvm wanting to know her xray results. Returned patients call, no answer, unable to leave vm. Have tried several times prior to give patient results and unsuccessful with reaching patient

## 2020-03-14 NOTE — Telephone Encounter (Signed)
I recommend Ortho to eval and mange her pain, refer to Ortho of her choice please

## 2020-03-14 NOTE — Telephone Encounter (Signed)
Patient was advised of xray results.She states she is still in terrible pain. She is also asking if she can have a prescription for hydrocodone.

## 2020-03-16 NOTE — Telephone Encounter (Signed)
Tried to contact patient to discuss referral. Voicemail is full and I am unable to leave message. Will try again later.

## 2020-03-17 NOTE — Telephone Encounter (Signed)
Tried to contact patient regarding a referral. No answer, unable to leave a vm

## 2020-06-08 DIAGNOSIS — H02834 Dermatochalasis of left upper eyelid: Secondary | ICD-10-CM | POA: Diagnosis not present

## 2020-06-08 DIAGNOSIS — H43813 Vitreous degeneration, bilateral: Secondary | ICD-10-CM | POA: Diagnosis not present

## 2020-06-08 DIAGNOSIS — H16223 Keratoconjunctivitis sicca, not specified as Sjogren's, bilateral: Secondary | ICD-10-CM | POA: Diagnosis not present

## 2020-06-08 DIAGNOSIS — H02831 Dermatochalasis of right upper eyelid: Secondary | ICD-10-CM | POA: Diagnosis not present

## 2020-08-07 ENCOUNTER — Ambulatory Visit: Payer: Medicare PPO | Admitting: Family Medicine

## 2020-08-10 ENCOUNTER — Ambulatory Visit: Payer: Medicare PPO | Admitting: Family Medicine

## 2020-11-24 DIAGNOSIS — I5030 Unspecified diastolic (congestive) heart failure: Secondary | ICD-10-CM | POA: Diagnosis not present

## 2020-11-24 DIAGNOSIS — I10 Essential (primary) hypertension: Secondary | ICD-10-CM | POA: Diagnosis not present

## 2020-11-24 DIAGNOSIS — E559 Vitamin D deficiency, unspecified: Secondary | ICD-10-CM | POA: Diagnosis not present

## 2020-11-24 DIAGNOSIS — E785 Hyperlipidemia, unspecified: Secondary | ICD-10-CM | POA: Diagnosis not present

## 2020-11-24 DIAGNOSIS — J45909 Unspecified asthma, uncomplicated: Secondary | ICD-10-CM | POA: Diagnosis not present

## 2021-02-07 ENCOUNTER — Ambulatory Visit (INDEPENDENT_AMBULATORY_CARE_PROVIDER_SITE_OTHER): Payer: Medicare PPO

## 2021-02-07 ENCOUNTER — Other Ambulatory Visit: Payer: Self-pay

## 2021-02-07 ENCOUNTER — Telehealth: Payer: Self-pay

## 2021-02-07 VITALS — Ht 66.0 in | Wt 232.0 lb

## 2021-02-07 DIAGNOSIS — Z0000 Encounter for general adult medical examination without abnormal findings: Secondary | ICD-10-CM | POA: Diagnosis not present
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
## 2021-02-07 NOTE — Patient Instructions (Signed)
Angela Chase , Thank you for taking time to come for your Medicare Wellness Visit. I appreciate your ongoing commitment to your health goals. Please review the following plan we discussed and let me know if I can assist you in the future.   Screening recommendations/referrals: Colonoscopy: Cologuard 08/05/2018, Colonoscopy due now. Mammogram: Done 03/15/2019 Repeat annually  Bone Density: Done 02/03/2018 Repeat every 2 years  Recommended yearly ophthalmology/optometry visit for glaucoma screening and checkup Recommended yearly dental visit for hygiene and checkup  Vaccinations: Influenza vaccine: Done 12/27/2019 Repeat annually  Pneumococcal vaccine: Done 06/17/2013 and 07/26/2014 Tdap vaccine: Done 09/15/2014 Repeat in 10 years  Shingles vaccine: Shingrix discussed. Please contact your pharmacy for coverage information.  Zoster done 06/17/2013   Covid-19:Done 07/01/2019, 07/28/2019, 01/20/2020.   Advanced directives: Advance directive discussed with you today. Even though you declined this today, please call our office should you change your mind, and we can give you the proper paperwork for you to fill out.   Conditions/risks identified: Aim for 30 minutes of exercise or brisk walking each day, drink 6-8 glasses of water and eat lots of fruits and vegetables.   Next appointment: Follow up in one year for your annual wellness visit 2023.   Preventive Care 71 Years and Older, Female Preventive care refers to lifestyle choices and visits with your health care provider that can promote health and wellness. What does preventive care include? A yearly physical exam. This is also called an annual well check. Dental exams once or twice a year. Routine eye exams. Ask your health care provider how often you should have your eyes checked. Personal lifestyle choices, including: Daily care of your teeth and gums. Regular physical activity. Eating a healthy diet. Avoiding tobacco and drug  use. Limiting alcohol use. Practicing safe sex. Taking low-dose aspirin every day. Taking vitamin and mineral supplements as recommended by your health care provider. What happens during an annual well check? The services and screenings done by your health care provider during your annual well check will depend on your age, overall health, lifestyle risk factors, and family history of disease. Counseling  Your health care provider may ask you questions about your: Alcohol use. Tobacco use. Drug use. Emotional well-being. Home and relationship well-being. Sexual activity. Eating habits. History of falls. Memory and ability to understand (cognition). Work and work Statistician. Reproductive health. Screening  You may have the following tests or measurements: Height, weight, and BMI. Blood pressure. Lipid and cholesterol levels. These may be checked every 5 years, or more frequently if you are over 56 years old. Skin check. Lung cancer screening. You may have this screening every year starting at age 66 if you have a 30-pack-year history of smoking and currently smoke or have quit within the past 15 years. Fecal occult blood test (FOBT) of the stool. You may have this test every year starting at age 70. Flexible sigmoidoscopy or colonoscopy. You may have a sigmoidoscopy every 5 years or a colonoscopy every 10 years starting at age 23. Hepatitis C blood test. Hepatitis B blood test. Sexually transmitted disease (STD) testing. Diabetes screening. This is done by checking your blood sugar (glucose) after you have not eaten for a while (fasting). You may have this done every 1-3 years. Bone density scan. This is done to screen for osteoporosis. You may have this done starting at age 32. Mammogram. This may be done every 1-2 years. Talk to your health care provider about how often you should have regular mammograms.  Talk with your health care provider about your test results, treatment  options, and if necessary, the need for more tests. Vaccines  Your health care provider may recommend certain vaccines, such as: Influenza vaccine. This is recommended every year. Tetanus, diphtheria, and acellular pertussis (Tdap, Td) vaccine. You may need a Td booster every 10 years. Zoster vaccine. You may need this after age 76. Pneumococcal 13-valent conjugate (PCV13) vaccine. One dose is recommended after age 28. Pneumococcal polysaccharide (PPSV23) vaccine. One dose is recommended after age 62. Talk to your health care provider about which screenings and vaccines you need and how often you need them. This information is not intended to replace advice given to you by your health care provider. Make sure you discuss any questions you have with your health care provider. Document Released: 05/12/2015 Document Revised: 01/03/2016 Document Reviewed: 02/14/2015 Elsevier Interactive Patient Education  2017 [place] Prevention in the Home Falls can cause injuries. They can happen to people of all ages. There are many things you can do to make your home safe and to help prevent falls. What can I do on the outside of my home? Regularly fix the edges of walkways and driveways and fix any cracks. Remove anything that might make you trip as you walk through a door, such as a raised step or threshold. Trim any bushes or trees on the path to your home. Use bright outdoor lighting. Clear any walking paths of anything that might make someone trip, such as rocks or tools. Regularly check to see if handrails are loose or broken. Make sure that both sides of any steps have handrails. Any raised decks and porches should have guardrails on the edges. Have any leaves, snow, or ice cleared regularly. Use sand or salt on walking paths during winter. Clean up any spills in your garage right away. This includes oil or grease spills. What can I do in the bathroom? Use night lights. Install grab  bars by the toilet and in the tub and shower. Do not use towel bars as grab bars. Use non-skid mats or decals in the tub or shower. If you need to sit down in the shower, use a plastic, non-slip stool. Keep the floor dry. Clean up any water that spills on the floor as soon as it happens. Remove soap buildup in the tub or shower regularly. Attach bath mats securely with double-sided non-slip rug tape. Do not have throw rugs and other things on the floor that can make you trip. What can I do in the bedroom? Use night lights. Make sure that you have a light by your bed that is easy to reach. Do not use any sheets or blankets that are too big for your bed. They should not hang down onto the floor. Have a firm chair that has side arms. You can use this for support while you get dressed. Do not have throw rugs and other things on the floor that can make you trip. What can I do in the kitchen? Clean up any spills right away. Avoid walking on wet floors. Keep items that you use a lot in easy-to-reach places. If you need to reach something above you, use a strong step stool that has a grab bar. Keep electrical cords out of the way. Do not use floor polish or wax that makes floors slippery. If you must use wax, use non-skid floor wax. Do not have throw rugs and other things on the floor that can make  you trip. What can I do with my stairs? Do not leave any items on the stairs. Make sure that there are handrails on both sides of the stairs and use them. Fix handrails that are broken or loose. Make sure that handrails are as long as the stairways. Check any carpeting to make sure that it is firmly attached to the stairs. Fix any carpet that is loose or worn. Avoid having throw rugs at the top or bottom of the stairs. If you do have throw rugs, attach them to the floor with carpet tape. Make sure that you have a light switch at the top of the stairs and the bottom of the stairs. If you do not have them,  ask someone to add them for you. What else can I do to help prevent falls? Wear shoes that: Do not have high heels. Have rubber bottoms. Are comfortable and fit you well. Are closed at the toe. Do not wear sandals. If you use a stepladder: Make sure that it is fully opened. Do not climb a closed stepladder. Make sure that both sides of the stepladder are locked into place. Ask someone to hold it for you, if possible. Clearly mark and make sure that you can see: Any grab bars or handrails. First and last steps. Where the edge of each step is. Use tools that help you move around (mobility aids) if they are needed. These include: Canes. Walkers. Scooters. Crutches. Turn on the lights when you go into a dark area. Replace any light bulbs as soon as they burn out. Set up your furniture so you have a clear path. Avoid moving your furniture around. If any of your floors are uneven, fix them. If there are any pets around you, be aware of where they are. Review your medicines with your doctor. Some medicines can make you feel dizzy. This can increase your chance of falling. Ask your doctor what other things that you can do to help prevent falls. This information is not intended to replace advice given to you by your health care provider. Make sure you discuss any questions you have with your health care provider. Document Released: 02/09/2009 Document Revised: 09/21/2015 Document Reviewed: 05/20/2014 Elsevier Interactive Patient Education  2017 [employer].

## 2021-02-13 ENCOUNTER — Ambulatory Visit: Payer: Medicare PPO | Admitting: Family Medicine

## 2021-07-30 DIAGNOSIS — E785 Hyperlipidemia, unspecified: Secondary | ICD-10-CM | POA: Diagnosis not present

## 2021-07-30 DIAGNOSIS — I10 Essential (primary) hypertension: Secondary | ICD-10-CM | POA: Diagnosis not present

## 2021-07-30 DIAGNOSIS — I5030 Unspecified diastolic (congestive) heart failure: Secondary | ICD-10-CM | POA: Diagnosis not present

## 2021-07-30 DIAGNOSIS — E559 Vitamin D deficiency, unspecified: Secondary | ICD-10-CM | POA: Diagnosis not present

## 2021-08-30 ENCOUNTER — Ambulatory Visit: Payer: Medicare PPO | Admitting: Family Medicine

## 2021-09-14 ENCOUNTER — Encounter: Payer: Medicare PPO | Admitting: Family Medicine

## 2022-02-11 ENCOUNTER — Ambulatory Visit (INDEPENDENT_AMBULATORY_CARE_PROVIDER_SITE_OTHER): Payer: Medicare PPO

## 2022-02-11 DIAGNOSIS — Z0000 Encounter for general adult medical examination without abnormal findings: Secondary | ICD-10-CM | POA: Diagnosis not present

## 2022-02-11 NOTE — Progress Notes (Signed)
Subjective:   Angela Chase is a 76 y.o. female who presents for Medicare Annual (Subsequent) preventive examination.  Review of Systems    I connected with  Angela Chase on 02/11/22 by a audio enabled telemedicine application and verified that I am speaking with the correct person using two identifiers.  Patient Location: Home  Provider Location: Office/Clinic  I discussed the limitations of evaluation and management by telemedicine. The patient expressed understanding and agreed to proceed.  Cardiac Risk Factors include: none     Objective:    Today's Vitals   02/11/22 0822  PainSc: 5    There is no height or weight on file to calculate BMI.     02/11/2022    8:31 AM 02/07/2021    8:52 AM 02/28/2020    2:07 PM 02/07/2020   11:38 AM 02/07/2020   11:22 AM 01/23/2020    2:15 PM 01/28/2019    9:59 AM  Advanced Directives  Does Patient Have a Medical Advance Directive? No No  No  No   Would patient like information on creating a medical advance directive? Yes (ED - Information included in AVS) No - Patient declined No - Patient declined No - Patient declined No - Patient declined No - Patient declined      Information is confidential and restricted. Go to Review Flowsheets to unlock data.    Current Medications (verified) Outpatient Encounter Medications as of 02/11/2022  Medication Sig   acetaminophen (TYLENOL) 500 MG tablet Take two tablets daily for arthritis pain (Patient taking differently: Take 1,000 mg by mouth every 6 (six) hours as needed for moderate pain.)   albuterol (PROVENTIL) (2.5 MG/3ML) 0.083% nebulizer solution Take 3 mLs (2.5 mg total) by nebulization every 6 (six) hours as needed for wheezing or shortness of breath.   albuterol (VENTOLIN HFA) 108 (90 Base) MCG/ACT inhaler Inhale 2 puffs into the lungs every 6 (six) hours as needed for wheezing or shortness of breath.   antiseptic oral rinse (BIOTENE) LIQD 15 mLs by Mouth Rinse route as needed  for dry mouth.   Artificial Tear Ointment (DRY EYES OP) Place 1 drop into both eyes 2 (two) times daily as needed (dry eyes).    Calcium Carb-Cholecalciferol [phone] MG-UNIT CAPS Take one capsule two times daily   cholecalciferol (VITAMIN D3) 25 MCG (1000 UNIT) tablet Take 1,000 Units by mouth daily.   Fluticasone-Salmeterol (ADVAIR) 100-50 MCG/DOSE AEPB Inhale 1 puff into the lungs 2 (two) times daily.   furosemide (LASIX) 40 MG tablet Take 40 mg by mouth daily. prn   zinc gluconate 50 MG tablet Take 50 mg by mouth daily.   predniSONE (DELTASONE) 10 MG tablet Take 1 tablet (10 mg total) by mouth 2 (two) times daily with a meal. (Patient not taking: Reported on 02/11/2022)   No facility-administered encounter medications on file as of 02/11/2022.    Allergies (verified) Cephalexin, Omeprazole, and Rofecoxib   History: Past Medical History:  Diagnosis Date   Anxiety    Asthma 2010   Depression    Falls, sequela 11/30/2016   Fall on 10/24/2016, blunt trauma to low back, hips and knees   GERD (gastroesophageal reflux disease) 1995   Hoarseness    Mixed hyperlipidemia    Obstructive sleep apnea    On CPAP   Osteoarthritis    Wears glasses    Past Surgical History:  Procedure Laterality Date   BREAST BIOPSY Right    Benign   CATARACT EXTRACTION W/PHACO Right  06/22/2018   Procedure: CATARACT EXTRACTION PHACO AND INTRAOCULAR LENS PLACEMENT (IOC);  Surgeon: Baruch Goldmann, MD;  Location: [facility];  Service: Ophthalmology;  Laterality: Right;  CDE:  14.11   CATARACT EXTRACTION W/PHACO Left 07/06/2018   Procedure: CATARACT EXTRACTION PHACO AND INTRAOCULAR LENS PLACEMENT (IOC);  Surgeon: Baruch Goldmann, MD;  Location: [facility];  Service: Ophthalmology;  Laterality: Left;  CDE: 10.14   CHOLECYSTECTOMY  1985   ROTATOR CUFF REPAIR Right 2005   Family History  Problem Relation Age of Onset   Cancer Father    Cancer Mother    Heart attack Mother    Cancer Brother    Social History    Socioeconomic History   Marital status: Single    Spouse name: Not on file   Number of children: 2   Years of education: 12   Highest education level: 12th grade  Occupational History   Occupation: retired  Tobacco Use   Smoking status: Never   Smokeless tobacco: Never  Vaping Use   Vaping Use: Never used  Substance and Sexual Activity   Alcohol use: No   Drug use: No   Sexual activity: Not Currently  Other Topics Concern   Not on file  Social History Narrative   Not on file   Social Determinants of Health   Financial Resource Strain: Medium Risk (02/11/2022)   Overall Financial Resource Strain (CARDIA)    Difficulty of Paying Living Expenses: Somewhat hard  Food Insecurity: No Food Insecurity (02/11/2022)   Hunger Vital Sign    Worried About Running Out of Food in the Last Year: Never true    Ran Out of Food in the Last Year: Never true  Transportation Needs: No Transportation Needs (02/11/2022)   PRAPARE - Hydrologist (Medical): No    Lack of Transportation (Non-Medical): No  Physical Activity: Inactive (02/11/2022)   Exercise Vital Sign    Days of Exercise per Week: 0 days    Minutes of Exercise per Session: 0 min  Stress: Stress Concern Present (02/11/2022)   [place]    Feeling of Stress : Very much  Social Connections: Moderately Isolated (02/11/2022)   Social Connection and Isolation Panel [NHANES]    Frequency of Communication with Friends and Family: Twice a week    Frequency of Social Gatherings with Friends and Family: Twice a week    Attends Religious Services: 1 to 4 times per year    Active Member of [employer] or Organizations: No    Attends Archivist Meetings: Never    Marital Status: Divorced    Tobacco Counseling Counseling given: Not Answered   Clinical Intake:  Angela Chase , Thank you for taking time to come for your Medicare  Wellness Visit. I appreciate your ongoing commitment to your health goals. Please review the following plan we discussed and let me know if I can assist you in the future.   These are the goals we discussed:  Goals      Exercise 3x per week (30 min per time)     Patient would like to start exercising three times a week for at least 30 minutes at a time.     Have 3 meals a day     Patient Stated     A new home,money,degree     Weight (lb) < 200 lb (90.7 kg)     Wants to lose 20-30lbs.  This is a list of the screening recommended for you and due dates:  Health Maintenance  Topic Date Due   Colon Cancer Screening  Never done   Zoster (Shingles) Vaccine (1 of 2) Never done   COVID-19 Vaccine (4 - Pfizer series) 03/16/2020   Mammogram  03/14/2021   Flu Shot  11/27/2021   Tetanus Vaccine  09/14/2024   Pneumonia Vaccine  Completed   DEXA scan (bone density measurement)  Completed   Hepatitis C Screening: USPSTF Recommendation to screen - Ages 77-79 yo.  Completed   HPV Vaccine  Aged Out    Pre-visit preparation completed: Yes  Pain : 0-10 Pain Score: 5  Pain Type: Chronic pain Pain Location: Shoulder Pain Descriptors / Indicators: Constant Pain Onset: More than a month ago Pain Frequency: Constant     BMI - recorded: 37.45 Nutritional Status: BMI > 30  Obese Nutritional Risks: None Diabetes: No  How often do you need to have someone help you when you read instructions, pamphlets, or other written materials from your doctor or pharmacy?: 1 - Never What is the last grade level you completed in school?: 12+  Diabetic?no  Interpreter Needed?: No      Activities of Daily Living    02/11/2022    8:32 AM  In your present state of health, do you have any difficulty performing the following activities:  Hearing? 1  Vision? 1  Difficulty concentrating or making decisions? 0  Walking or climbing stairs? 1  Dressing or bathing? 0  Doing errands, shopping? 0   Preparing Food and eating ? N  Using the Toilet? N  In the past six months, have you accidently leaked urine? N  Do you have problems with loss of bowel control? N  Managing your Medications? N  Managing your Finances? N  Housekeeping or managing your Housekeeping? N    Patient Care Team: Fayrene Helper, MD as PCP - General (Family Medicine) Leta Baptist, MD as Consulting Physician (Otolaryngology) Neldon Mc Donnamarie Poag, MD as Consulting Physician (Allergy and Immunology) Satira Sark, MD as Consulting Physician (Cardiology)  Indicate any recent Medical Services you may have received from other than [facility] providers in the past year (date may be approximate).     Assessment:   This is a routine wellness examination for [place].  Hearing/Vision screen No results found.  Dietary issues and exercise activities discussed: Current Exercise Habits: The patient does not participate in regular exercise at present, Exercise limited by: None identified   Goals Addressed             This Visit's Progress    Patient Stated       A new home,money,degree      Depression Screen    02/11/2022    8:32 AM 02/11/2022    8:30 AM 02/07/2021    8:45 AM 02/28/2020    2:07 PM 02/28/2020    2:02 PM 02/07/2020   11:22 AM 02/07/2020   11:12 AM  PHQ 2/9 Scores  PHQ - 2 Score '1 1 2 2 2 2 2  '$ PHQ- 9 Score   '3 4 4 5 5    '$ Fall Risk    02/11/2022    8:31 AM 02/07/2021    8:52 AM 03/07/2020   11:32 AM 03/07/2020   11:16 AM 02/28/2020    2:07 PM  Fall Risk   Falls in the past year? 0 '1 1 1 1  '$ Number falls in past yr: 0 0 0 0  0  Injury with Fall? 0 0 '1 1 1  '$ Risk for fall due to : No Fall Risks Impaired balance/gait Impaired balance/gait;Impaired mobility;History of fall(s)  Impaired balance/gait  Follow up Falls evaluation completed Falls prevention discussed   Falls evaluation completed    FALL RISK PREVENTION PERTAINING TO THE HOME:  Any stairs in or around the home? No  If so, are  there any without handrails? No  Home free of loose throw rugs in walkways, pet beds, electrical cords, etc? Yes  Adequate lighting in your home to reduce risk of falls? Yes   ASSISTIVE DEVICES UTILIZED TO PREVENT FALLS:  Life alert? No  Use of a cane, walker or w/c? Yes  Grab bars in the bathroom? Yes  Shower chair or bench in shower? No  Elevated toilet seat or a handicapped toilet? No    Cognitive Function:    02/11/2022    8:32 AM  MMSE - Mini Mental State Exam  Not completed: Unable to complete        02/11/2022    8:32 AM 02/07/2021    8:55 AM 02/28/2020    2:09 PM 02/07/2020   11:26 AM 01/28/2019   10:03 AM  6CIT Screen  What Year? 0 points 0 points 0 points 0 points   What month? 0 points 0 points 0 points 0 points   What time? 0 points 0 points 0 points 0 points   Count back from 20 0 points 0 points 0 points 0 points   Months in reverse 0 points 0 points 0 points 0 points   Repeat phrase 0 points 0 points 0 points 0 points   Total Score 0 points 0 points 0 points 0 points      Information is confidential and restricted. Go to Review Flowsheets to unlock data.    Immunizations Immunization History  Administered Date(s) Administered   Fluad Quad(high Dose 65+) 01/18/2019   Influenza Whole 02/26/2006   Influenza,inj,Quad PF,6+ Mos 01/27/2013, 04/01/2014, 05/16/2015, 05/27/2016, 02/25/2017, 01/06/2018, 12/27/2019   PFIZER(Purple Top)SARS-COV-2 Vaccination 07/01/2019, 07/28/2019, 01/20/2020   Pneumococcal Conjugate-13 07/26/2014   Pneumococcal Polysaccharide-23 06/17/2013   Td 12/07/2003   Tdap 09/15/2014   Zoster, Live 06/17/2013    TDAP status: Up to date  Flu Vaccine status: Due, Education has been provided regarding the importance of this vaccine. Advised may receive this vaccine at local pharmacy or Health Dept. Aware to provide a copy of the vaccination record if obtained from local pharmacy or Health Dept. Verbalized acceptance and  understanding.  Pneumococcal vaccine status: Up to date  Covid-19 vaccine status: Completed vaccines  Qualifies for Shingles Vaccine? Yes   Zostavax completed No   Shingrix Completed?: No.    Education has been provided regarding the importance of this vaccine. Patient has been advised to call insurance company to determine out of pocket expense if they have not yet received this vaccine. Advised may also receive vaccine at local pharmacy or Health Dept. Verbalized acceptance and understanding.  Screening Tests Health Maintenance  Topic Date Due   COLONOSCOPY (Pts 45-43yr Insurance coverage will need to be confirmed)  Never done   Zoster Vaccines- Shingrix (1 of 2) Never done   COVID-19 Vaccine (4 - Pfizer series) 03/16/2020   MAMMOGRAM  03/14/2021   INFLUENZA VACCINE  11/27/2021   TETANUS/TDAP  09/14/2024   Pneumonia Vaccine 75 Years old  Completed   DEXA SCAN  Completed   Hepatitis C Screening  Completed   HPV VACCINES  Aged  Out    Health Maintenance  Health Maintenance Due  Topic Date Due   COLONOSCOPY (Pts 45-65yr Insurance coverage will need to be confirmed)  Never done   Zoster Vaccines- Shingrix (1 of 2) Never done   COVID-19 Vaccine (4 - Pfizer series) 03/16/2020   MAMMOGRAM  03/14/2021   INFLUENZA VACCINE  11/27/2021    Colorectal cancer screening: Referral to GI placed  . Pt aware the office will call re: appt.  Mammogram status: Ordered  . Pt provided with contact info and advised to call to schedule appt.   Bone Density status: Completed  . Results reflect: Bone density results: NORMAL. Repeat every 2 years.  Lung Cancer Screening: (Low Dose CT Chest recommended if Age 75-80years, 30 pack-year currently smoking OR have quit w/in 15years.) does not qualify.   Lung Cancer Screening Referral: no  Additional Screening:  Hepatitis C Screening: does qualify; Completed 05/22/15  Vision Screening: Recommended annual ophthalmology exams for early detection of  glaucoma and other disorders of the eye. Is the patient up to date with their annual eye exam?  No  Who is the provider or what is the name of the office in which the patient attends annual eye exams? N/a If pt is not established with a provider, would they like to be referred to a provider to establish care? No .   Dental Screening: Recommended annual dental exams for proper oral hygiene  Community Resource Referral / Chronic Care Management: CRR required this visit?  No   CCM required this visit?  No      Plan:     I have personally reviewed and noted the following in the patient's chart:   Medical and social history Use of alcohol, tobacco or illicit drugs  Current medications and supplements including opioid prescriptions. Patient is not currently taking opioid prescriptions. Functional ability and status Nutritional status Physical activity Advanced directives List of other physicians Hospitalizations, surgeries, and ER visits in previous 12 months Vitals Screenings to include cognitive, depression, and falls Referrals and appointments  In addition, I have reviewed and discussed with patient certain preventive protocols, quality metrics, and best practice recommendations. A written personalized care plan for preventive services as well as general preventive health recommendations were provided to patient.     KQuentin Angst [place]  02/11/2022

## 2022-02-11 NOTE — Patient Instructions (Signed)
  Angela Chase , Thank you for taking time to come for your Medicare Wellness Visit. I appreciate your ongoing commitment to your health goals. Please review the following plan we discussed and let me know if I can assist you in the future.   These are the goals we discussed:  Goals      Exercise 3x per week (30 min per time)     Patient would like to start exercising three times a week for at least 30 minutes at a time.     Have 3 meals a day     Patient Stated     A new home,money,degree     Weight (lb) < 200 lb (90.7 kg)     Wants to lose 20-30lbs.        This is a list of the screening recommended for you and due dates:  Health Maintenance  Topic Date Due   Colon Cancer Screening  Never done   Zoster (Shingles) Vaccine (1 of 2) Never done   COVID-19 Vaccine (4 - Pfizer series) 03/16/2020   Mammogram  03/14/2021   Flu Shot  11/27/2021   Tetanus Vaccine  09/14/2024   Pneumonia Vaccine  Completed   DEXA scan (bone density measurement)  Completed   Hepatitis C Screening: USPSTF Recommendation to screen - Ages 17-79 yo.  Completed   HPV Vaccine  Aged Out

## 2022-02-26 ENCOUNTER — Ambulatory Visit: Payer: Medicare PPO | Admitting: Family Medicine

## 2022-02-26 ENCOUNTER — Encounter: Payer: Self-pay | Admitting: Family Medicine

## 2022-08-27 ENCOUNTER — Ambulatory Visit: Payer: Medicare PPO | Admitting: Family Medicine

## 2022-08-27 ENCOUNTER — Encounter (INDEPENDENT_AMBULATORY_CARE_PROVIDER_SITE_OTHER): Payer: Self-pay | Admitting: *Deleted

## 2022-08-27 ENCOUNTER — Encounter: Payer: Self-pay | Admitting: Family Medicine

## 2022-08-27 VITALS — BP 138/88 | HR 110 | Resp 16 | Ht 65.5 in | Wt 243.0 lb

## 2022-08-27 DIAGNOSIS — M25552 Pain in left hip: Secondary | ICD-10-CM

## 2022-08-27 DIAGNOSIS — Z6841 Body Mass Index (BMI) 40.0 and over, adult: Secondary | ICD-10-CM

## 2022-08-27 DIAGNOSIS — E559 Vitamin D deficiency, unspecified: Secondary | ICD-10-CM

## 2022-08-27 DIAGNOSIS — Z599 Problem related to housing and economic circumstances, unspecified: Secondary | ICD-10-CM

## 2022-08-27 DIAGNOSIS — Z1211 Encounter for screening for malignant neoplasm of colon: Secondary | ICD-10-CM | POA: Diagnosis not present

## 2022-08-27 DIAGNOSIS — E785 Hyperlipidemia, unspecified: Secondary | ICD-10-CM | POA: Diagnosis not present

## 2022-08-27 DIAGNOSIS — J45909 Unspecified asthma, uncomplicated: Secondary | ICD-10-CM | POA: Diagnosis not present

## 2022-08-27 DIAGNOSIS — Z1231 Encounter for screening mammogram for malignant neoplasm of breast: Secondary | ICD-10-CM | POA: Diagnosis not present

## 2022-08-27 DIAGNOSIS — G4733 Obstructive sleep apnea (adult) (pediatric): Secondary | ICD-10-CM | POA: Diagnosis not present

## 2022-08-27 DIAGNOSIS — R7989 Other specified abnormal findings of blood chemistry: Secondary | ICD-10-CM | POA: Diagnosis not present

## 2022-08-27 DIAGNOSIS — Z748 Other problems related to care provider dependency: Secondary | ICD-10-CM

## 2022-08-27 DIAGNOSIS — R29898 Other symptoms and signs involving the musculoskeletal system: Secondary | ICD-10-CM

## 2022-08-27 MED ORDER — METHYLPREDNISOLONE ACETATE 80 MG/ML IJ SUSP
80.0000 mg | Freq: Once | INTRAMUSCULAR | Status: AC
  Administered 2022-08-27: 80 mg via INTRAMUSCULAR

## 2022-08-27 MED ORDER — PREDNISONE 10 MG PO TABS: 10.0000 mg | ORAL_TABLET | Freq: Two times a day (BID) | ORAL | 0 refills | Status: DC
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
## 2022-08-27 NOTE — Patient Instructions (Addendum)
Annual exam mid to end June, call if you need me sooner  Labs today CBC CMP and EGFR  TSH and vitamin D.  Please schedule mammogram at checkout.   You are referred to pulmonary Dr.  so that your asthma as well as your sleep apnea can be treated   Please do follow-up and schedule an appointment with the GI doctors for your colonoscopy, you do need this.  You are referred to Dr. Romeo Apple Re left hip and left hand pain following the fall.  Depo-Medrol 80 mg IM in the office for hip pain, and a 5-day course of prednisone is prescribed.  You are referred urgently to case management/social worker through the [facility] network is a part of our team, we work together, I am specifically asking for help with housing and transportation.  As we discussed I do believe this will go a long way in helping to address your healthcare needs. ( Nurse pls enter [facility] referral as urgent)  Thanks for choosing [facility], we consider it a privelige to serve you.

## 2022-08-28 ENCOUNTER — Other Ambulatory Visit: Payer: Self-pay

## 2022-08-28 ENCOUNTER — Encounter: Payer: Self-pay | Admitting: Family Medicine

## 2022-08-28 DIAGNOSIS — R29898 Other symptoms and signs involving the musculoskeletal system: Secondary | ICD-10-CM | POA: Insufficient documentation

## 2022-08-28 DIAGNOSIS — Z748 Other problems related to care provider dependency: Secondary | ICD-10-CM | POA: Insufficient documentation

## 2022-08-28 DIAGNOSIS — R7989 Other specified abnormal findings of blood chemistry: Secondary | ICD-10-CM

## 2022-08-28 DIAGNOSIS — Z599 Problem related to housing and economic circumstances, unspecified: Secondary | ICD-10-CM | POA: Insufficient documentation

## 2022-08-28 LAB — CBC WITH DIFFERENTIAL/PLATELET
Basophils Absolute: 0.1 10*3/uL (ref 0.0–0.2)
Basos: 1 %
EOS (ABSOLUTE): 0.3 10*3/uL (ref 0.0–0.4)
Eos: 4 %
Hematocrit: 40.9 % (ref 34.0–46.6)
Hemoglobin: 12.7 g/dL (ref 11.1–15.9)
Immature Grans (Abs): 0.1 10*3/uL (ref 0.0–0.1)
Immature Granulocytes: 1 %
Lymphocytes Absolute: 1.6 10*3/uL (ref 0.7–3.1)
Lymphs: 20 %
MCH: 25 pg — ABNORMAL LOW (ref 26.6–33.0)
MCHC: 31.1 g/dL — ABNORMAL LOW (ref 31.5–35.7)
MCV: 81 fL (ref 79–97)
Monocytes Absolute: 0.8 10*3/uL (ref 0.1–0.9)
Monocytes: 10 %
Neutrophils Absolute: 5.3 10*3/uL (ref 1.4–7.0)
Neutrophils: 64 %
Platelets: 265 10*3/uL (ref 150–450)
RBC: 5.08 x10E6/uL (ref 3.77–5.28)
RDW: 13.9 % (ref 11.7–15.4)
WBC: 8.1 10*3/uL (ref 3.4–10.8)

## 2022-08-28 LAB — CMP14+EGFR
ALT: 51 IU/L — ABNORMAL HIGH (ref 0–32)
AST: 55 IU/L — ABNORMAL HIGH (ref 0–40)
Albumin/Globulin Ratio: 1 — ABNORMAL LOW (ref 1.2–2.2)
Albumin: 3.7 g/dL — ABNORMAL LOW (ref 3.8–4.8)
Alkaline Phosphatase: 102 IU/L (ref 44–121)
BUN/Creatinine Ratio: 14 (ref 12–28)
BUN: 10 mg/dL (ref 8–27)
Bilirubin Total: 0.3 mg/dL (ref 0.0–1.2)
CO2: 21 mmol/L (ref 20–29)
Calcium: 9.1 mg/dL (ref 8.7–10.3)
Chloride: 103 mmol/L (ref 96–106)
Creatinine, Ser: 0.73 mg/dL (ref 0.57–1.00)
Globulin, Total: 3.7 g/dL (ref 1.5–4.5)
Glucose: 88 mg/dL (ref 70–99)
Potassium: 4 mmol/L (ref 3.5–5.2)
Sodium: 141 mmol/L (ref 134–144)
Total Protein: 7.4 g/dL (ref 6.0–8.5)
eGFR: 86 mL/min/{1.73_m2} (ref >59)

## 2022-08-28 LAB — TSH: TSH: 5.87 u[IU]/mL — ABNORMAL HIGH (ref 0.450–4.500)

## 2022-08-28 LAB — VITAMIN D 25 HYDROXY (VIT D DEFICIENCY, FRACTURES): Vit D, 25-Hydroxy: 25.8 ng/mL — ABNORMAL LOW (ref 30.0–100.0)

## 2022-08-28 NOTE — Assessment & Plan Note (Signed)
Pulmonary to re eval and treat
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
## 2022-08-28 NOTE — Assessment & Plan Note (Signed)
Hyperlipidemia:Low fat diet discussed and encouraged.   Lipid Panel  Lab Results  Component Value Date   CHOL 211 (H) 12/27/2019   HDL 67 12/27/2019   LDLCALC 129 (H) 12/27/2019   TRIG 87 12/27/2019   CHOLHDL 3.1 12/27/2019     Updated lab needed at/ before next visit.

## 2022-08-28 NOTE — Assessment & Plan Note (Signed)
  Patient re-educated about  the importance of commitment to a  minimum of 150 minutes of exercise per week as able.  The importance of healthy food choices with portion control discussed, as well as eating regularly and within a 12 hour window most days. The need to choose "clean , green" food 50 to 75% of the time is discussed, as well as to make water the primary drink and set a goal of 64 ounces water daily.       08/27/2022    1:42 PM 02/07/2021    8:36 AM 03/07/2020   11:13 AM  Weight /BMI  Weight 243 lb 232 lb 232 lb  Height 5' 5.5" (1.664 m) 5\' 6"  (1.676 m) 5\' 6"  (1.676 m)  BMI 39.82 kg/m2 37.45 kg/m2 37.45 kg/m2    Deteriorated

## 2022-08-28 NOTE — Assessment & Plan Note (Addendum)
Pain and instability inc since 08/13/2022 when she fell on the hip, refer Ortho Depo medrol 80 mg IM and short course prednisone prescribed
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
## 2022-08-28 NOTE — Assessment & Plan Note (Signed)
C/o inability to close hand and make a fist x 1 month following fall, refer Ortho

## 2022-08-28 NOTE — Assessment & Plan Note (Signed)
Has no vhicl to drive and has to rely on one/ 2 people to help wihth transport including medical appts, has not seen Provider in th system for nearly 3 years

## 2022-08-28 NOTE — Assessment & Plan Note (Signed)
Updated lab needed at/ before next visit.   

## 2022-08-30 LAB — HEPATIC FUNCTION PANEL
ALT: 53 IU/L — ABNORMAL HIGH (ref 0–32)
AST: 59 IU/L — ABNORMAL HIGH (ref 0–40)
Albumin: 3.9 g/dL (ref 3.8–4.8)
Alkaline Phosphatase: 108 IU/L (ref 44–121)
Bilirubin Total: 0.2 mg/dL (ref 0.0–1.2)
Bilirubin, Direct: <0.1 mg/dL (ref 0.00–0.40)
Total Protein: 7.5 g/dL (ref 6.0–8.5)

## 2022-08-30 LAB — SPECIMEN STATUS REPORT

## 2022-08-30 LAB — T3, FREE: T3, Free: 2.4 pg/mL (ref 2.0–4.4)

## 2022-08-30 LAB — T4, FREE: Free T4: 1 ng/dL (ref 0.82–1.77)

## 2022-09-05 ENCOUNTER — Ambulatory Visit (HOSPITAL_COMMUNITY): Payer: Medicare PPO

## 2022-09-05 ENCOUNTER — Telehealth: Payer: Self-pay

## 2022-09-05 NOTE — Progress Notes (Signed)
  Chronic Care Management   Note  09/05/2022 Name: Angela Chase MRN: [id number] DOB: 03/29/1946  Angela Chase is a 77 y.o. year old female who is a primary care patient of Lodema Hong Milus Mallick, MD. I reached out to [name] by phone today in response to a referral sent by Ms. Bartholome Bill Faley's PCP.  The first contact attempt was unsuccessful.   Follow up plan: Additional outreach attempts will be made.  Penne Lash, RMA Care Guide [facility]  [place], [place] [address] Direct Dial: [phone] [email or web address]

## 2022-09-12 ENCOUNTER — Encounter: Payer: Self-pay | Admitting: Pulmonary Disease

## 2022-09-12 ENCOUNTER — Encounter (HOSPITAL_COMMUNITY): Payer: Self-pay

## 2022-09-12 ENCOUNTER — Ambulatory Visit (HOSPITAL_COMMUNITY): Payer: Medicare PPO

## 2022-09-12 ENCOUNTER — Ambulatory Visit (INDEPENDENT_AMBULATORY_CARE_PROVIDER_SITE_OTHER): Payer: Medicare PPO | Admitting: Pulmonary Disease

## 2022-09-12 VITALS — BP 103/70 | HR 112 | Ht 65.5 in | Wt 224.3 lb

## 2022-09-12 DIAGNOSIS — G4733 Obstructive sleep apnea (adult) (pediatric): Secondary | ICD-10-CM | POA: Diagnosis not present

## 2022-09-12 DIAGNOSIS — J45909 Unspecified asthma, uncomplicated: Secondary | ICD-10-CM

## 2022-09-12 DIAGNOSIS — Z599 Problem related to housing and economic circumstances, unspecified: Secondary | ICD-10-CM | POA: Diagnosis not present
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
## 2022-09-12 NOTE — Patient Instructions (Signed)
  X Home sleep test  X Rx for nebuliser to DME for asthma

## 2022-09-12 NOTE — Assessment & Plan Note (Signed)
Spirometry has been normal in the past. Will continue Advair. Prescription will be provided for nebulizer and albuterol nebs

## 2022-09-12 NOTE — Assessment & Plan Note (Signed)
We will reassess with a home sleep test. Previously she had an auto CPAP and download in 2017 had shown average pressure of 12 cm and poor compliance.  Based on sleep study results we will get her a new CPAP machine and check on her compliance  Weight loss encouraged, compliance with goal of at least 4-6 hrs every night is the expectation. Advised against medications with sedative side effects Cautioned against driving when sleepy - understanding that sleepiness will vary on a day to day basis

## 2022-09-13 ENCOUNTER — Other Ambulatory Visit: Payer: Self-pay | Admitting: Family Medicine

## 2022-09-13 DIAGNOSIS — Z5982 Transportation insecurity: Secondary | ICD-10-CM

## 2022-09-13 DIAGNOSIS — J4520 Mild intermittent asthma, uncomplicated: Secondary | ICD-10-CM

## 2022-09-13 DIAGNOSIS — G4733 Obstructive sleep apnea (adult) (pediatric): Secondary | ICD-10-CM

## 2022-09-13 DIAGNOSIS — E7849 Other hyperlipidemia: Secondary | ICD-10-CM

## 2022-09-13 DIAGNOSIS — Z59819 Housing instability, housed unspecified: Secondary | ICD-10-CM

## 2022-09-13 DIAGNOSIS — M159 Polyosteoarthritis, unspecified: Secondary | ICD-10-CM

## 2022-09-20 NOTE — Progress Notes (Signed)
  Chronic Care Management   Note  09/20/2022 Name: GRETCHIN GOLDSCHMIDT MRN: [id number] DOB: July 02, 1945  OANH SINEGAL is a 77 y.o. year old female who is a primary care patient of Lodema Hong Milus Mallick, MD. I reached out to [name] by phone today in response to a referral sent by Ms. Bartholome Bill Tristan's PCP.  Ms. Bickerstaff was given information about Chronic Care Management services today including:  CCM service includes personalized support from designated clinical staff supervised by the physician, including individualized plan of care and coordination with other care providers 24/7 contact phone numbers for assistance for urgent and routine care needs. Service will only be billed when office clinical staff spend 20 minutes or more in a month to coordinate care. Only one practitioner may furnish and bill the service in a calendar month. The patient may stop CCM services at amy time (effective at the end of the month) by phone call to the office staff. The patient will be responsible for cost sharing (co-pay) or up to 20% of the service fee (after annual deductible is met)  Ms. BONNIE VELADOR  agreedto scheduling an appointment with the CCM RN Case Manager   Follow up plan: Patient agreed to scheduled appointment with RN Case Manager on 09/24/2022(date/time).   Penne Lash, RMA Care Guide [facility]  [place], [place] [address] Direct Dial: [phone] [email or web address]

## 2022-09-24 ENCOUNTER — Telehealth: Payer: Self-pay | Admitting: *Deleted

## 2022-09-24 ENCOUNTER — Telehealth: Payer: Medicare PPO

## 2022-09-24 NOTE — Telephone Encounter (Signed)
   CCM RN Visit Note   09/24/22 Name: Angela Chase MRN: [id number]      DOB: Aug 17, 1945  Subjective: Angela Chase is a 77 y.o. year old female who is a primary care patient of Syliva Overman MD. The patient was referred to the Chronic Care Management team for assistance with care management needs subsequent to provider initiation of CCM services and plan of care.      An unsuccessful telephone outreach was attempted today to contact the patient about Chronic Care Management needs.    Plan:Telephone follow up appointment with care management team member scheduled for:  upon care guide rescheduling.  Irving Shows [facility], BSN RN Case Manager [facility] [phone]

## 2022-09-27 DIAGNOSIS — J4540 Moderate persistent asthma, uncomplicated: Secondary | ICD-10-CM | POA: Diagnosis not present

## 2022-10-07 ENCOUNTER — Ambulatory Visit: Payer: Self-pay | Admitting: *Deleted

## 2022-10-07 NOTE — Patient Outreach (Signed)
  Care Coordination   10/07/2022  Name: Angela Chase MRN: [id number] DOB: 03-20-1946   Care Coordination Outreach Attempts:  An unsuccessful telephone outreach was attempted today to offer the patient information about available care coordination services.  HIPAA compliant messages left on voicemail, providing contact information for CSW, encouraging patient to return CSW's call at her earliest convenience.  Follow Up Plan:  Additional outreach attempts will be made to offer the patient care coordination information and services.   Encounter Outcome:  No Answer.   Care Coordination Interventions:  No, not indicated.    Danford Bad, BSW, MSW, LCSW  Licensed Restaurant manager, [employer] [facility]  Mailing [place] [address], [place], [place] [address] Physical Address-[address], [place], [place] [address] Toll Free Main # [phone] Fax # [phone] Cell # [phone] [email or web address]

## 2022-10-08 ENCOUNTER — Ambulatory Visit: Payer: Self-pay | Admitting: *Deleted

## 2022-10-08 DIAGNOSIS — R7989 Other specified abnormal findings of blood chemistry: Secondary | ICD-10-CM | POA: Insufficient documentation

## 2022-10-09 ENCOUNTER — Encounter: Payer: Self-pay | Admitting: *Deleted

## 2022-10-09 NOTE — Patient Instructions (Signed)
Visit Information  Thank you for taking time to visit with me today. Please don't hesitate to contact me if I can be of assistance to you.   Following are the goals we discussed today:   Goals Addressed               This Visit's Progress     Find Help in My Community. (pt-stated)   On track     Care Coordination Interventions:  Interventions Today    Flowsheet Row Most Recent Value  Chronic Disease   Chronic disease during today's visit Other  [Food Insecurity, Lack of Reliable Transportation, Financial Difficulties, Inadequate Housing, Recurrent Falls, Social Isolation, Limited Family Support]  General Interventions   General Interventions Discussed/Reviewed General Interventions Discussed, Health Screening, General Interventions Reviewed, Annual Eye Exam, Labs, Vaccines, Doctor Visits, [facility], Level of Care, Communication with, Horticulturist, commercial (DME)  [Encouraged]  Labs Kidney Function  [Encouraged]  Vaccines COVID-19, Flu, Pneumonia, RSV, Shingles, Tetanus/Pertussis/Diphtheria  [Encouraged]  Doctor Visits Discussed/Reviewed Doctor Visits Discussed, Specialist, Doctor Visits Reviewed, Annual Wellness Visits, PCP  [Encouraged]  Health Screening Bone Density, Colonoscopy, Mammogram  [Encouraged]  Durable Medical Equipment (DME) Walker, BP Cuff, Other  [Eye Glasses & Cane]  PCP/Specialist Visits Compliance with follow-up visit  [Encouraged]  Communication with PCP/Specialists, RN, Pharmacists  [Encouraged]  Level of Care Adult Daycare, Applications, Assisted Living, Personal Care Services  [Encouraged]  Applications Medicaid, Personal Care Services  [Encouraged]  Exercise Interventions   Exercise Discussed/Reviewed Exercise Discussed, Exercise Reviewed, Physical Activity, Weight Managment, Assistive device use and maintanence  [Encouraged]  Physical Activity Discussed/Reviewed Physical Activity Discussed, Home Exercise Program (HEP), Physical Activity Reviewed,  Types of exercise  [Encouraged]  Weight Management Weight loss  [Encouraged]  Education Interventions   Education Provided Provided Therapist, sports, Provided Web-based Education, Provided Education  [Encouraged]  Provided Engineer, petroleum On Nutrition, Mental Health/Coping with Illness, When to see the doctor, Eye Care, Labs, Applications, Exercise, Medication, Development worker, community, [facility], Other  [Encouraged]  Ship broker, Personal Care Services  [Encouraged]  Mental Health Interventions   Mental Health Discussed/Reviewed Mental Health Discussed, Depression, Mental Health Reviewed, Grief and Loss, Substance Abuse, Suicide, Other, Coping Strategies, Crisis, Anxiety  [Domestic Violence]  Nutrition Interventions   Nutrition Discussed/Reviewed Nutrition Discussed, Adding fruits and vegetables, Nutrition Reviewed, Fluid intake, Portion sizes, Carbohydrate meal planning, Decreasing sugar intake, Increasing proteins, Decreasing fats, Decreasing salt  [Encouraged]  Pharmacy Interventions   Pharmacy Dicussed/Reviewed Pharmacy Topics Discussed, Medication Adherence, Pharmacy Topics Reviewed, Medications and their functions, Affording Medications  [Encouraged]  Safety Interventions   Safety Discussed/Reviewed Safety Discussed, Safety Reviewed, Fall Risk, Home Safety  [Encouraged]  Home Safety Assistive Devices, Need for home safety assessment, Refer for home visit, Refer for community resources  [Encouraged]  Advanced Directive Interventions   Advanced Directives Discussed/Reviewed Advanced Directives Discussed  [Encouraged]      Assessed Social Determinant of Health Barriers. Discussed Plans for Ongoing Care Management Follow Up. Provided Careers information officer Information for Care Management Team Members. Screened for Signs & Symptoms of Depression, Related to Chronic Disease State.  PHQ2 & PHQ9 Depression Screen Completed & Results Reviewed.  Suicidal Ideation & Homicidal Ideation  Assessed - None Present.   Domestic Violence Assessed - None Present. Access to Weapons Assessed - None Present.   Active Listening & Reflection Utilized.  Verbalization of Feelings Encouraged.  Emotional Support Provided. Feelings of Anxiety & Stress Validated. Food Insecurity, Financial Deficits, Inadequate & Risk manager, Social Isolation & Inadequate Housing Acknowledged.  Community Resources Reviewed Arts administrator. Crisis Support Information, Agencies, Services & Resources Discussed. Problem Solving Interventions Identified. Task-Centered Solutions Implemented.   Solution-Focused Strategies Developed. Acceptance & Commitment Therapy Introduced. Brief Cognitive Behavioral Therapy Initiated. Client-Centered Therapy Enacted. Reviewed Prescription Medications & Discussed Importance of Compliance. Quality of Sleep Assessed & Sleep Hygiene Techniques Promoted. Emphasized Importance of Using Handrails & Assistive Devices While Standing & Ambulating, in An Effort to Reduce Risk of Falls & Injury. Discussed Higher Level of Care Options (I.e. Assisted Living, Extended Care, Rest Home, Family Care Home, Etc.) & Encouraged Consideration. Verified No In-Home Care Services, [employer], Warden/ranger, Etc., Covered Under Firefighter through [employer].  Reviewed Materials engineer through [facility] & Encouraged Completion of Medicaid Application & Submission to [facility] of Social Services [phone]), for Processing. Verified No Long-Term Care Insurance Benefits, Secondary Insurance Policies, Plans, Coverage, Etc.  Confirmed Ineligibility to Apply for Aid & Attendance Benefits, through [facility] [phone]). Encouraged Review of The Following List of [employer], Walt Disney, Wellsite geologist, Mailed on 10/09/2022: ~ [place] Senior Farmers' Freight forwarder ~ [employer] Support & Nutrition Program  ~ Armed forces logistics/support/administrative officer, Pharmacist, hospital in [place] ~ [employer] Application ~ Scientist, forensic ~ Engineer, drilling ~ TEFL teacher ~ Low Income Chief Technology Officer ~ [employer] System ~ Transportation Options ~ [employer] Social research officer, government ~ 2023 Medicaid Tips ~ [employer] Application ~ Community education officer in [place] ~ Low Income Housing in [place], [place] ~ [employer] ~ Section 8 Statistician ~ Family Caregivers ~ Home Care Services & Resources ~ Home Health Care Agencies ~ In-Home Care & Respite Agencies ~ Personal Care Services Application ~ Theatre manager Providers Encouraged Careers information officer with [employer], Services & Resources of Interest in [place], in An Effort to [employer], Surveyor, quantity, Biomedical engineer. Encouraged Completion of Application for Medicaid & Submission to [facility] of Social Services [phone]), for Processing. Encouraged Completion of Application for Personal Care Services, through [facility] [phone]), if Approved for Medicaid, through [facility] of Social Services [phone]). Encouraged Careers information officer with CSW (# [phone]), if You Have Questions, Need Assistance, or If Additional Social Work Needs Are Identified Between Now & Our Next Scheduled Follow-Up Outreach Call.      Our next appointment is by telephone on 10/17/2022 at 12:00 pm.   Please call the care guide team at [phone] if you need to cancel or reschedule your appointment.   If you are experiencing a Mental Health or Behavioral Health Crisis or need someone to talk to, please call the Suicide and Crisis Lifeline:  988 call the [place] National Suicide Prevention Lifeline: 281 388 7267 or TTY: (215)031-7455 TTY 503-121-0001) to talk to a trained counselor call 1-800-273-TALK (toll free, 24 hour hotline) go to [facility] [address], [place] [phone]) call the [facility] Crisis Line: [phone] call 911  Patient verbalizes understanding of instructions and care plan provided today and agrees to view in MyChart. Active MyChart status and patient understanding of how to access instructions and care plan via MyChart confirmed with patient.     Telephone follow up appointment with care management team member scheduled for:  10/17/2022 at 12:00 pm.   Danford Bad, BSW, MSW, LCSW  Licensed  Clinical Social Financial planner [facility]  Mailing [place] [address] [place], [place] [address] Physical Address[address], [place], [place] [address] Toll Free Main # [phone] Fax # [phone] Cell # [phone] [email or web address]

## 2022-10-09 NOTE — Patient Outreach (Signed)
Care Coordination   Initial Visit Note   10/09/2022 - Late Entry  Name: Angela Chase MRN: [id number] DOB: 03-31-1946  Angela Chase is a 77 y.o. year old female who sees Kerri Perches, MD for primary care. I spoke with [name] by phone today.  What matters to the patients health and wellness today?  Find Help in My Community.   Goals Addressed               This Visit's Progress     Find Help in My Community. (pt-stated)   On track     Care Coordination Interventions:  Interventions Today    Flowsheet Row Most Recent Value  Chronic Disease   Chronic disease during today's visit Other  [Food Insecurity, Lack of Reliable Transportation, Financial Difficulties, Inadequate Housing, Recurrent Falls, Social Isolation, Limited Family Support]  General Interventions   General Interventions Discussed/Reviewed General Interventions Discussed, Health Screening, General Interventions Reviewed, Annual Eye Exam, Labs, Vaccines, Doctor Visits, [facility], Level of Care, Communication with, Horticulturist, commercial (DME)  [Encouraged]  Labs Kidney Function  [Encouraged]  Vaccines COVID-19, Flu, Pneumonia, RSV, Shingles, Tetanus/Pertussis/Diphtheria  [Encouraged]  Doctor Visits Discussed/Reviewed Doctor Visits Discussed, Specialist, Doctor Visits Reviewed, Annual Wellness Visits, PCP  [Encouraged]  Health Screening Bone Density, Colonoscopy, Mammogram  [Encouraged]  Durable Medical Equipment (DME) Walker, BP Cuff, Other  [Eye Glasses & Cane]  PCP/Specialist Visits Compliance with follow-up visit  [Encouraged]  Communication with PCP/Specialists, RN, Pharmacists  [Encouraged]  Level of Care Adult Daycare, Applications, Assisted Living, Personal Care Services  [Encouraged]  Applications Medicaid, Personal Care Services  [Encouraged]  Exercise Interventions   Exercise Discussed/Reviewed Exercise Discussed, Exercise Reviewed, Physical Activity, Weight  Managment, Assistive device use and maintanence  [Encouraged]  Physical Activity Discussed/Reviewed Physical Activity Discussed, Home Exercise Program (HEP), Physical Activity Reviewed, Types of exercise  [Encouraged]  Weight Management Weight loss  [Encouraged]  Education Interventions   Education Provided Provided Therapist, sports, Provided Web-based Education, Provided Education  [Encouraged]  Provided Engineer, petroleum On Nutrition, Mental Health/Coping with Illness, When to see the doctor, Eye Care, Labs, Applications, Exercise, Medication, Development worker, community, [facility], Other  [Encouraged]  Ship broker, Personal Care Services  [Encouraged]  Mental Health Interventions   Mental Health Discussed/Reviewed Mental Health Discussed, Depression, Mental Health Reviewed, Grief and Loss, Substance Abuse, Suicide, Other, Coping Strategies, Crisis, Anxiety  [Domestic Violence]  Nutrition Interventions   Nutrition Discussed/Reviewed Nutrition Discussed, Adding fruits and vegetables, Nutrition Reviewed, Fluid intake, Portion sizes, Carbohydrate meal planning, Decreasing sugar intake, Increasing proteins, Decreasing fats, Decreasing salt  [Encouraged]  Pharmacy Interventions   Pharmacy Dicussed/Reviewed Pharmacy Topics Discussed, Medication Adherence, Pharmacy Topics Reviewed, Medications and their functions, Affording Medications  [Encouraged]  Safety Interventions   Safety Discussed/Reviewed Safety Discussed, Safety Reviewed, Fall Risk, Home Safety  [Encouraged]  Home Safety Assistive Devices, Need for home safety assessment, Refer for home visit, Refer for community resources  [Encouraged]  Advanced Directive Interventions   Advanced Directives Discussed/Reviewed Advanced Directives Discussed  [Encouraged]      Assessed Social Determinant of Health Barriers. Discussed Plans for Ongoing Care Management Follow Up. Provided Careers information officer Information for Care Management Team  Members. Screened for Signs & Symptoms of Depression, Related to Chronic Disease State.  PHQ2 & PHQ9 Depression Screen Completed & Results Reviewed.  Suicidal Ideation & Homicidal Ideation Assessed - None Present.   Domestic Violence Assessed - None Present. Access to Weapons Assessed - None Present.   Active Listening &  Reflection Utilized.  Verbalization of Feelings Encouraged.  Emotional Support Provided. Feelings of Anxiety & Stress Validated. Food Insecurity, Financial Deficits, Inadequate & Risk manager, Social Isolation & Inadequate Housing Acknowledged. Community Resources Reviewed Arts administrator. Crisis Support Information, Agencies, Services & Resources Discussed. Problem Solving Interventions Identified. Task-Centered Solutions Implemented.   Solution-Focused Strategies Developed. Acceptance & Commitment Therapy Introduced. Brief Cognitive Behavioral Therapy Initiated. Client-Centered Therapy Enacted. Reviewed Prescription Medications & Discussed Importance of Compliance. Quality of Sleep Assessed & Sleep Hygiene Techniques Promoted. Emphasized Importance of Using Handrails & Assistive Devices While Standing & Ambulating, in An Effort to Reduce Risk of Falls & Injury. Discussed Higher Level of Care Options (I.e. Assisted Living, Extended Care, Rest Home, Family Care Home, Etc.) & Encouraged Consideration. Verified No In-Home Care Services, [employer], Warden/ranger, Etc., Covered Under Firefighter through [employer].  Reviewed Materials engineer through [facility] & Encouraged Completion of Medicaid Application & Submission to The [facility] of Social Services [phone]), for Processing. Verified No Long-Term Care Insurance Benefits, Secondary Insurance Policies, Plans, Coverage, Etc.  Confirmed Ineligibility to Apply for Aid & Attendance Benefits, through [facility] [phone]). Encouraged Review of The Following List of [employer]uss, Walt Disney, Wellsite geologist, Mailed on 10/09/2022: ~ [place] Senior Farmers' Primary school teacher ~ [employer] Support & Nutrition Program  ~ Armed forces logistics/support/administrative officer, Pharmacist, hospital in [place] ~ [employer] Application ~ Scientist, forensic ~ Engineer, drilling ~ TEFL teacher ~ Low Income Chief Technology Officer ~ [employer] ~ Transportation Options ~ [employer] Social research officer, government ~ 2023 Medicaid Tips ~ [employer] Application ~ Community education officer in [place] ~ Low Income Housing in [place], [place] ~ [employer] ~ Section 8 Statistician ~ Family Caregivers ~ Home Care Services & Resources ~ Home Health Care Agencies ~ In-Home Care & Respite Agencies ~ Personal Care Services Application ~ Theatre manager Providers Encouraged Careers information officer with [employer], Services & Resources of Interest in [place], in An Effort to [employer], Surveyor, quantity, Biomedical engineer. Encouraged Completion of Application for Medicaid & Submission to [facility] of Social Services [phone]), for Processing. Encouraged Completion of Application for Personal Care Services, through [facility] [phone]), if Approved for Medicaid, through [facility] of Social Services [phone]). Encouraged Careers information officer with CSW (# [phone]), if You Have Questions, Need Assistance, or If Additional Social Work Needs Are Identified Between Now & Our Next Scheduled Follow-Up Outreach Call.        SDOH assessments and interventions completed:  Yes.  SDOH Interventions Today    Flowsheet Row Most Recent Value   SDOH Interventions   Food Insecurity Interventions Assist with SNAP Application, Other (Comment)  [Provided List of Food [employer], Food Pantries & Soup Kitchens, Placed on Waiting List for Meals on Wheels & Provided Supplemental Nutrition Assistance Program Application & Offered to Assist with Completion & Submission]  Housing Interventions Other (Comment)  [Provided Science writer for Section 8 & Low-Income Housing]  Transportation Interventions Patient Resources (Friends/Family), Payor Benefit, Other (Comment)  [Provided Transportation Resources]  Utilities Interventions Intervention Not Indicated, Other (Comment)  [Provided Engineer, water for [employer] Assistance Program]  Alcohol Usage Interventions Intervention Not Indicated (Score <7)  Financial Strain Interventions Artist, Other (Comment)  [Provided Surveyor, quantity  Resources, Provided [facility] Assistance Application & Encouraged Applying for Medicaid]  Physical Activity Interventions Patient Declined, Other (Comments)  [Encouraged Enrollment in Silver Sneakers]  Stress Interventions Offered [employer], Provide Counseling, Other (Comment)  [Provided Counseling Resources]  Social Connections Interventions Patient Declined     Care Coordination Interventions:  Yes, provided.   Follow up plan: Follow up call scheduled for 10/17/2022 at 12:00 pm.   Encounter Outcome:  Pt. Visit Completed.   Danford Bad, BSW, MSW, LCSW  Licensed Restaurant manager, fast food [facility]  Mailing [place] [address] [place], [place] [address] Physical Address[address], [place], [place] [address] Toll Free Main # [phone] Fax # [phone] Cell # [phone] [email or web address]

## 2022-10-11 ENCOUNTER — Telehealth: Payer: Self-pay | Admitting: Family Medicine

## 2022-10-11 NOTE — Telephone Encounter (Signed)
Patient called to follow up on conversation she just had with her insurance company;stated this issue needs to be addressed by the provider and not the insurance company.   Requesting call back. Please advise at [phone].

## 2022-10-15 ENCOUNTER — Ambulatory Visit (INDEPENDENT_AMBULATORY_CARE_PROVIDER_SITE_OTHER): Payer: Medicare PPO | Admitting: *Deleted

## 2022-10-15 DIAGNOSIS — J45909 Unspecified asthma, uncomplicated: Secondary | ICD-10-CM

## 2022-10-15 DIAGNOSIS — E7849 Other hyperlipidemia: Secondary | ICD-10-CM

## 2022-10-15 NOTE — Chronic Care Management (AMB) (Signed)
Chronic Care Management   CCM RN Visit Note  10/15/2022 Name: Angela Chase MRN: [id number] DOB: [birth year]  Subjective: Angela Chase is a 77 y.o. year old female who is a primary care patient of Kerri Perches, MD. The patient was referred to the Chronic Care Management team for assistance with care management needs subsequent to provider initiation of CCM services and plan of care.    Today's Visit:  Engaged with patient by telephone for initial visit.     SDOH Interventions Today    Flowsheet Row Most Recent Value  SDOH Interventions   Food Insecurity Interventions Other (Comment)  [LCSW involved and resources given]  Housing Interventions --  [LCSW working w/ pt]  Transportation Interventions Other (Comment)  [LCSW is working w/ pt,  pt states she drives and has car]  Utilities Interventions Intervention Not Indicated  Financial Strain Interventions Other (Comment)  [LCSW is working w/ pt]  Physical Activity Interventions Patient Declined  Stress Interventions Other (Comment)  [LCSW is working w/ pt]  Social Connections Interventions Patient Declined         Goals Addressed             This Visit's Progress    CCM (ASTHMA) EXPECTED OUTCOME: MONITOR, SELF-MANAGE AND REDUCE SYMPTOMS OF ASTHMA       Current Barriers:  Knowledge Deficits related to Asthma management Care Coordination needs related to Social work issues (financial constraints) in a patient with Asthma Chronic Disease Management support and education needs related to Asthma Corporate treasurer.  No Advanced Directives in place- pt requests documents be mailed Patient reports she lives alone, has friends, extended family she can call on if needed Patient reports "breathing ok",  has newer nebulizer machine but has not been using, pt reports she is using Advair as prescribed  Planned Interventions: Provided patient with basic written and verbal COPD education on self care/management/and  exacerbation prevention Advised patient to track and manage COPD triggers Provided instruction about proper use of medications used for management of COPD including inhalers Advised patient to self assesses COPD action plan zone and make appointment with provider if in the yellow zone for 48 hours without improvement Advised patient to engage in light exercise as tolerated 3-5 days a week to aid in the the management of COPD Provided education about and advised patient to utilize infection prevention strategies to reduce risk of respiratory infection Screening for signs and symptoms of depression related to chronic disease state  Assessed social determinant of health barriers Advanced directives packet mailed  Symptom Management: Take medications as prescribed   Attend all scheduled provider appointments Call pharmacy for medication refills 3-7 days in advance of running out of medications Attend church or other social activities Call provider office for new concerns or questions  Work with the social worker to address care coordination needs and will continue to work with the clinical team to address health care and disease management related needs identify and remove indoor air pollutants listen for public air quality announcements every day do breathing exercises every day develop a rescue plan develop a new routine to improve sleep get at least 7 to 8 hours of sleep at night practice relaxation or meditation daily Advanced directives packet mailed Education mailed- COPD/ Asthma action plan Please use nebulizer and inhalers as prescribed  Follow Up Plan: Telephone follow up appointment with care management team member scheduled for:  11/28/22 at 3 pm       CCM (HYPERLIPIDEMIA) EXPECTED  OUTCOME: MONITOR, SELF-MANAGE AND REDUCE SYMPTOMS OF HYPERLIPIDEMIA       Current Barriers:  Knowledge Deficits related to Hyperlipidemia management Care Coordination needs related to financial  constraints in a patient with Hyperlipidemia Chronic Disease Management support and education needs related to Hyperlipidemia, diet Financial Constraints.  No Advanced Directives in place LCSW has been in contact with patient with next outreach scheduled for 10/18/22 Patient reports she does some upper body exercises but not able to do much exercise due to mobility issues,  pt reports 5 falls in the past year, has cane and walker she uses consistenly  Planned Interventions: Provider established cholesterol goals reviewed; Counseled on importance of regular laboratory monitoring as prescribed; Provided HLD educational materials; Reviewed exercise goals and target of 150 minutes per week; Screening for signs and symptoms of depression related to chronic disease state;  Assessed social determinant of health barriers;  Safety precautions reviewed  Symptom Management: Take medications as prescribed   Attend all scheduled provider appointments Call pharmacy for medication refills 3-7 days in advance of running out of medications Attend church or other social activities Call provider office for new concerns or questions  Work with the social worker to address care coordination needs and will continue to work with the clinical team to address health care and disease management related needs - call doctor with any symptoms you believe are related to your medicine - call doctor when you experience any new symptoms - go to all doctor appointments as scheduled - adhere to prescribed diet: heart healthy - develop an exercise routine fall prevention strategies: q position slowly, use assistive device such as walker or cane (per provider recommendations) when walking, keep walkways clear, have good lighting in room. It is important to contact your provider if you have any falls, maintain muscle strength/tone by exercise per provider recommendations. Education mailed- heart healthy diet  Follow Up  Plan: Telephone follow up appointment with care management team member scheduled for:  11/28/22 at 3 pm          Plan:Telephone follow up appointment with care management team member scheduled for:  11/28/22 at 3 pm  Irving Shows [facility], BSN RN Case Manager [facility] [phone]

## 2022-10-15 NOTE — Plan of Care (Signed)
Chronic Care Management Provider Comprehensive Care Plan    10/15/2022 Name: Angela Chase MRN: [id number] DOB: Jul 06, 1945  Referral to Chronic Care Management (CCM) services was placed by Provider:  Syliva Overman MD on Date: 08/28/22.  Chronic Condition 1: ASTHMA Provider Assessment and Plan Asthma Reports having no equipment and has not been evaluated by pulmonary for over  years refer for re eval   Expected Outcome/Goals Addressed This Visit (Provider CCM goals/Provider Assessment and plan  CCM (ASTHMA) EXPECTED OUTCOME: MONITOR, SELF-MANAGE AND REDUCE SYMPTOMS OF ASTHMA  Symptom Management Condition 1: Take medications as prescribed   Attend all scheduled provider appointments Call pharmacy for medication refills 3-7 days in advance of running out of medications Attend church or other social activities Call provider office for new concerns or questions  Work with the social worker to address care coordination needs and will continue to work with the clinical team to address health care and disease management related needs identify and remove indoor air pollutants listen for public air quality announcements every day do breathing exercises every day develop a rescue plan develop a new routine to improve sleep get at least 7 to 8 hours of sleep at night practice relaxation or meditation daily Advanced directives packet mailed Education mailed- COPD/ Asthma action plan Please use nebulizer and inhalers as prescribed  Chronic Condition 2: HYPERLIPIDEMIA Provider Assessment and Plan Hyperlipemia- per collaboration with primary care provider 09/24/22 to add HLD as diagnosis Hyperlipidemia:Low fat diet discussed and encouraged   Expected Outcome/Goals Addressed This Visit (Provider CCM goals/Provider Assessment and plan  CCM (HYPERLIPIDEMIA) EXPECTED OUTCOME: MONITOR, SELF-MANAGE AND REDUCE SYMPTOMS OF HYPERLIPIDEMIA  Symptom Management Condition 2: Take medications as  prescribed   Attend all scheduled provider appointments Call pharmacy for medication refills 3-7 days in advance of running out of medications Attend church or other social activities Call provider office for new concerns or questions  Work with the social worker to address care coordination needs and will continue to work with the clinical team to address health care and disease management related needs - call doctor with any symptoms you believe are related to your medicine - call doctor when you experience any new symptoms - go to all doctor appointments as scheduled - adhere to prescribed diet: heart healthy - develop an exercise routine fall prevention strategies: q position slowly, use assistive device such as walker or cane (per provider recommendations) when walking, keep walkways clear, have good lighting in room. It is important to contact your provider if you have any falls, maintain muscle strength/tone by exercise per provider recommendations. Education mailed- heart healthy diet  Problem List Patient Active Problem List   Diagnosis Date Noted   Elevated TSH 10/08/2022   Left hand weakness 08/28/2022   Housing or economic circumstance 08/28/2022   Assistance needed with transportation 08/28/2022   Encounter for annual general medical examination with abnormal findings in adult 03/08/2020   Left hip pain 03/08/2020   Positive colorectal cancer screening using Cologuard test 08/05/2018   Vision loss, bilateral 01/06/2018   Social isolation 10/04/2017   Pruritus 11/09/2015   Seasonal allergies 08/30/2015   Recurrent falls while walking 02/01/2015   Urinary incontinence 04/01/2014   Vitamin D deficiency 04/01/2014   Asthma 10/11/2013   Unspecified vitamin D deficiency 06/18/2013   Osteoarthritis of both knees 06/18/2013   Morbid obesity with BMI of 40.0-44.9, adult (HCC) 06/18/2013   Insomnia 06/18/2013   Shoulder pain, right 06/17/2013   OSA (obstructive sleep apnea)  09/26/2011   Hyperlipemia 05/25/2007    Medication Management  Current Outpatient Medications:    acetaminophen (TYLENOL) 500 MG tablet, Take two tablets daily for arthritis pain (Patient taking differently: Take 1,000 mg by mouth every 6 (six) hours as needed for moderate pain.), Disp: 100 tablet, Rfl: 3   cholecalciferol (VITAMIN D3) 25 MCG (1000 UNIT) tablet, Take 1,000 Units by mouth daily., Disp: , Rfl:    Fluticasone-Salmeterol (ADVAIR) 100-50 MCG/DOSE AEPB, Inhale 1 puff into the lungs 2 (two) times daily., Disp: 1 each, Rfl: 3   furosemide (LASIX) 40 MG tablet, Take 40 mg by mouth daily. prn, Disp: , Rfl:    albuterol (PROVENTIL) (2.5 MG/3ML) 0.083% nebulizer solution, Take 3 mLs (2.5 mg total) by nebulization every 6 (six) hours as needed for wheezing or shortness of breath. (Patient not taking: Reported on 10/15/2022), Disp: 150 mL, Rfl: 2   albuterol (VENTOLIN HFA) 108 (90 Base) MCG/ACT inhaler, Inhale 2 puffs into the lungs every 6 (six) hours as needed for wheezing or shortness of breath. (Patient not taking: Reported on 09/12/2022), Disp: 18 g, Rfl: 5   antiseptic oral rinse (BIOTENE) LIQD, 15 mLs by Mouth Rinse route as needed for dry mouth. (Patient not taking: Reported on 09/12/2022), Disp: , Rfl:    Artificial Tear Ointment (DRY EYES OP), Place 1 drop into both eyes 2 (two) times daily as needed (dry eyes).  (Patient not taking: Reported on 09/12/2022), Disp: , Rfl:    Calcium Carb-Cholecalciferol [phone] MG-UNIT CAPS, Take one capsule two times daily (Patient not taking: Reported on 10/15/2022), Disp: 60 capsule, Rfl: 11   zinc gluconate 50 MG tablet, Take 50 mg by mouth daily. (Patient not taking: Reported on 09/12/2022), Disp: , Rfl:   Cognitive Assessment Identity Confirmed: : Name; DOB Cognitive Status: Normal Other:  : N/A   Functional Assessment Wear Glasses or Blind: yes Vision Management: glasses Concentrating, Remembering or Making Decisions Difficulty (CP):  no Difficulty Communicating: no Difficulty Eating/Swallowing: no Walking or Climbing Stairs Difficulty: yes Walking or Climbing Stairs: ambulation difficulty, requires equipment Mobility Management: uses walker and cane Dressing/Bathing Difficulty: no Doing Errands Independently Difficulty (such as shopping) (CP): no   Caregiver Assessment  Primary Source of Support/Comfort: -- (no one particular person, has friends and family can call on) Name of Support/Comfort Primary Source: no one particular person People in Home: alone   Planned Interventions  Provided patient with basic written and verbal COPD education on self care/management/and exacerbation prevention Advised patient to track and manage COPD triggers Provided instruction about proper use of medications used for management of COPD including inhalers Advised patient to self assesses COPD action plan zone and make appointment with provider if in the yellow zone for 48 hours without improvement Advised patient to engage in light exercise as tolerated 3-5 days a week to aid in the the management of COPD Provided education about and advised patient to utilize infection prevention strategies to reduce risk of respiratory infection Screening for signs and symptoms of depression related to chronic disease state  Assessed social determinant of health barriers Advanced directives packet mailed Provider established cholesterol goals reviewed; Counseled on importance of regular laboratory monitoring as prescribed; Provided HLD educational materials; Reviewed exercise goals and target of 150 minutes per week; Screening for signs and symptoms of depression related to chronic disease state;  Assessed social determinant of health barriers;  Safety precautions reviewed  Interaction and coordination with outside resources, practitioners, and providers See CCM Referral  Care Plan: Printed  and mailed to patient

## 2022-10-15 NOTE — Patient Instructions (Signed)
Please call the care guide team at [phone] if you need to cancel or reschedule your appointment.   If you are experiencing a Mental Health or Behavioral Health Crisis or need someone to talk to, please call the Suicide and Crisis Lifeline: 988 call the [place] National Suicide Prevention Lifeline: (573)151-8179 or TTY: (405)865-0925 TTY (970)477-3191) to talk to a trained counselor call 1-800-273-TALK (toll free, 24 hour hotline) go to [facility] [address] [place] [phone]) call the [facility]: [phone] call 911   Following is a copy of the CCM Program Consent:  CCM service includes personalized support from designated clinical staff supervised by the physician, including individualized plan of care and coordination with other care providers 24/7 contact phone numbers for assistance for urgent and routine care needs. Service will only be billed when office clinical staff spend 20 minutes or more in a month to coordinate care. Only one practitioner may furnish and bill the service in a calendar month. The patient may stop CCM services at amy time (effective at the end of the month) by phone call to the office staff. 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  No Advanced Directives in place- pt requests documents be mailed Patient reports she lives alone, has friends, extended  family she can call on if needed Patient reports "breathing ok",  has newer nebulizer machine but has not been using, pt reports she is using Advair as prescribed  Planned Interventions: Provided patient with basic written and verbal COPD education on self care/management/and exacerbation prevention Advised patient to track and manage COPD triggers Provided instruction about proper use of medications used for management of COPD including inhalers Advised patient to self assesses COPD action plan zone and make appointment with provider if in the yellow zone for 48 hours without improvement Advised patient to engage in light exercise as tolerated 3-5 days a week to aid in the the management of COPD Provided education about and advised patient to utilize infection prevention strategies to reduce risk of respiratory infection Screening for signs and symptoms of depression related to chronic disease state  Assessed social determinant of health barriers Advanced directives packet mailed  Symptom Management: Take medications as prescribed   Attend all scheduled provider appointments Call pharmacy for medication refills 3-7 days in advance of running out of medications Attend church or other social activities Call provider office for new concerns or questions  Work with the social worker to address care coordination needs and will continue to work with the clinical team to address health care and disease management related needs identify and remove indoor air pollutants listen for public air quality announcements every day do breathing exercises every day develop a rescue plan develop a new routine to improve sleep get at least 7 to 8 hours of sleep at night practice relaxation or meditation daily Advanced directives packet mailed Education mailed-  COPD/ Asthma action plan Please use nebulizer and inhalers as prescribed  Follow Up Plan: Telephone follow up appointment with care management  team member scheduled for:  11/28/22 at 3 pm       CCM (HYPERLIPIDEMIA) EXPECTED OUTCOME: MONITOR, SELF-MANAGE AND REDUCE SYMPTOMS OF HYPERLIPIDEMIA       Current Barriers:  Knowledge Deficits related to Hyperlipidemia management Care Coordination needs related to financial constraints in a patient with Hyperlipidemia Chronic Disease Management support and education needs related to Hyperlipidemia, diet Financial Constraints.  No Advanced Directives in place LCSW has been in contact with patient with next outreach scheduled for 10/18/22 Patient reports she does some upper body exercises but not able to do much exercise due to mobility issues,  pt reports 5 falls in the past year, has cane and walker she uses consistenly  Planned Interventions: Provider established cholesterol goals reviewed; Counseled on importance of regular laboratory monitoring as prescribed; Provided HLD educational materials; Reviewed exercise goals and target of 150 minutes per week; Screening for signs and symptoms of depression related to chronic disease state;  Assessed social determinant of health barriers;  Safety precautions reviewed  Symptom Management: Take medications as prescribed   Attend all scheduled provider appointments Call pharmacy for medication refills 3-7 days in advance of running out of medications Attend church or other social activities Call provider office for new concerns or questions  Work with the social worker to address care coordination needs and will continue to work with the clinical team to address health care and disease management related needs - call doctor with any symptoms you believe are related to your medicine - call doctor when you experience any new symptoms - go to all doctor appointments as scheduled - adhere to prescribed diet: heart healthy - develop an exercise routine fall prevention strategies: q position slowly, use assistive device such as walker or cane (per  provider recommendations) when walking, keep walkways clear, have good lighting in room. It is important to contact your provider if you have any falls, maintain muscle strength/tone by exercise per provider recommendations. Education mailed- heart healthy diet  Follow Up Plan: Telephone follow up appointment with care management team member scheduled for:  11/28/22 at 3 pm          The patient verbalized understanding of instructions, educational materials, and care plan provided today and agreed to receive a mailed copy of patient instructions, educational materials, and care plan.  Telephone follow up appointment with care management team member scheduled for:   11/28/22/ at 3 pm  Asthma Action Plan, Adult An asthma action plan helps you understand how to manage your asthma and what to do when you have an asthma attack. The action plan is a color-coded plan that lists the symptoms that indicate whether or not your condition is under control and what actions to take. If you have symptoms in the green zone, you are doing well. If you have symptoms in the yellow zone, you are having problems. If you have symptoms in the red zone, you need medical care right away. Follow the plan that you and your health care provider develop. Review your plan with your health care provider at each visit. What triggers your asthma? Knowing the things that can trigger an asthma attack or make your asthma symptoms worse is very important. Talk to your health care provider about your asthma triggers and how to avoid them. Record your known asthma triggers here: _______________ What is your personal best  peak flow reading? If you use a peak flow meter, determine your personal best reading. Record it here: _______________ [place] zone This zone means that your asthma is under control. You may not have any symptoms while you are in the green zone. This means that you: Have no coughing or wheezing, even while you are  working or playing. Sleep through the night. Are breathing well. Have a peak flow reading that is above __________ (80% of your personal best or greater). If you are in the green zone, continue to manage your asthma as directed. Take these medicines every day: Controller medicine and dosage: _______________ Controller medicine and dosage: _______________ Controller medicine and dosage: _______________ Controller medicine and dosage: _______________ Before exercise, use this reliever or rescue medicine: _______________ Call your health care provider if you are using a reliever or rescue medicine more than 2-3 times a week. Yellow zone Symptoms in this zone mean that your condition may be getting worse. You may have symptoms that interfere with exercise, are noticeably worse after exposure to triggers, or are worse at the first sign of a cold (upper respiratory infection). These may include: Waking from sleep. Coughing, especially at night or first thing in the morning. Mild wheezing. Chest tightness. A peak flow reading that is __________ to __________ (50-79% of your personal best). If you have any of these symptoms: Add the following medicine to the ones that you use daily: Reliever or rescue medicine and dosage: _______________ Additional medicine and dosage: _______________ Call your health care provider if: You remain in the yellow zone for __________ hours. You are using a reliever or rescue medicine more than 2-3 times a week. Red zone Symptoms in this zone mean that you should get medical help right away. You will likely feel distressed and have symptoms at rest that restrict your activity. You are in the red zone if: You are breathing hard and quickly. Your nose opens wide, your ribs show, and your neck muscles become visible when you breathe in. Your lips, fingers, or toes are a bluish color. You have trouble speaking in full sentences. Your peak flow reading is less than  __________ (less than 50% of your personal best). Your symptoms do not improve within 15-20 minutes after you use your reliever or rescue medicine (bronchodilator). If you have any of these symptoms: These symptoms represent a serious problem that is an emergency. Do not wait to see if the symptoms will go away. Get medical help right away. Call your local emergency services (911 in the [place] Do not drive yourself to the hospital. Use your reliever or rescue medicine. Start a nebulizer treatment or take 2-4 puffs from a metered-dose inhaler with a spacer. Repeat this action every 15-20 minutes until help arrives. Where to find more information You can find more information about asthma from: Centers for Disease Control and Prevention: [employer]Ex[employer].com.br American Lung Association: www.lung.org This information is not intended to replace advice given to you by your health care provider. Make sure you discuss any questions you have with your health care provider. Document Revised: 06/08/2020 Document Reviewed: 06/08/2020 Elsevier Patient Education  2024 [employer] Heart-Healthy Eating Plan Eating a healthy diet is important for the health of your heart. A heart-healthy eating plan includes: Eating less unhealthy fats. Eating more healthy fats. Eating less salt in your food. Salt is also called sodium. Making other changes in your diet. Talk with your doctor or a diet specialist (dietitian) to create an eating plan that is  right for you. What is my plan? Your doctor may recommend an eating plan that includes: Total fat: ______% or less of total calories a day. Saturated fat: ______% or less of total calories a day. Cholesterol: less than _________mg a day. Sodium: less than _________mg a day. What are tips for following this plan? Cooking Avoid frying your food. Try to bake, boil, grill, or broil it instead. You can also reduce fat by: Removing the skin from poultry. Removing all visible  fats from meats. Steaming vegetables in water or broth. Meal planning  At meals, divide your plate into four equal parts: Fill one-half of your plate with vegetables and green salads. Fill one-fourth of your plate with whole grains. Fill one-fourth of your plate with lean protein foods. Eat 2-4 cups of vegetables per day. One cup of vegetables is: 1 cup (91 g) broccoli or cauliflower florets. 2 medium carrots. 1 large bell pepper. 1 large sweet potato. 1 large tomato. 1 medium white potato. 2 cups (150 g) raw leafy greens. Eat 1-2 cups of fruit per day. One cup of fruit is: 1 small apple 1 large banana 1 cup (237 g) mixed fruit, 1 large orange,  cup (82 g) dried fruit, 1 cup (240 mL) 100% fruit juice. Eat more foods that have soluble fiber. These are apples, broccoli, carrots, beans, peas, and barley. Try to get 20-30 g of fiber per day. Eat 4-5 servings of nuts, legumes, and seeds per week: 1 serving of dried beans or legumes equals  cup (90 g) cooked. 1 serving of nuts is  oz (12 almonds, 24 pistachios, or 7 walnut halves). 1 serving of seeds equals  oz (8 g). General information Eat more home-cooked food. Eat less restaurant, buffet, and fast food. Limit or avoid alcohol. Limit foods that are high in starch and sugar. Avoid fried foods. Lose weight if you are overweight. Keep track of how much salt (sodium) you eat. This is important if you have high blood pressure. Ask your doctor to tell you more about this. Try to add vegetarian meals each week. Fats Choose healthy fats. These include olive oil and canola oil, flaxseeds, walnuts, almonds, and seeds. Eat more omega-3 fats. These include salmon, mackerel, sardines, tuna, flaxseed oil, and ground flaxseeds. Try to eat fish at least 2 times each week. Check food labels. Avoid foods with trans fats or high amounts of saturated fat. Limit saturated fats. These are often found in animal products, such as meats, butter,  and cream. These are also found in plant foods, such as palm oil, palm kernel oil, and coconut oil. Avoid foods with partially hydrogenated oils in them. These have trans fats. Examples are stick margarine, some tub margarines, cookies, crackers, and other baked goods. What foods should I eat? Fruits All fresh, canned (in natural juice), or frozen fruits. Vegetables Fresh or frozen vegetables (raw, steamed, roasted, or grilled). Green salads. Grains Most grains. Choose whole wheat and whole grains most of the time. Rice and pasta, including brown rice and pastas made with whole wheat. Meats and other proteins Lean, well-trimmed beef, veal, pork, and lamb. Chicken and [place] without skin. All fish and shellfish. Wild duck, rabbit, pheasant, and venison. Egg whites or low-cholesterol egg substitutes. Dried beans, peas, lentils, and tofu. Seeds and most nuts. Dairy Low-fat or nonfat cheeses, including ricotta and mozzarella. Skim or 1% milk that is liquid, powdered, or evaporated. Buttermilk that is made with low-fat milk. Nonfat or low-fat yogurt. Fats and oils Non-hydrogenated (  trans-free) margarines. Vegetable oils, including soybean, sesame, sunflower, olive, peanut, safflower, corn, canola, and cottonseed. Salad dressings or mayonnaise made with a vegetable oil. Beverages Mineral water. Coffee and tea. Diet carbonated beverages. Sweets and desserts Sherbet, gelatin, and fruit ice. Small amounts of dark chocolate. Limit all sweets and desserts. Seasonings and condiments All seasonings and condiments. The items listed above may not be a complete list of foods and drinks you can eat. Contact a dietitian for more options. What foods should I avoid? Fruits Canned fruit in heavy syrup. Fruit in cream or butter sauce. Fried fruit. Limit coconut. Vegetables Vegetables cooked in cheese, cream, or butter sauce. Fried vegetables. Grains Breads that are made with saturated or trans fats, oils, or  whole milk. Croissants. Sweet rolls. Donuts. High-fat crackers, such as cheese crackers. Meats and other proteins Fatty meats, such as hot dogs, ribs, sausage, bacon, rib-eye roast or steak. High-fat deli meats, such as salami and bologna. Caviar. Domestic duck and goose. Organ meats, such as liver. Dairy Cream, sour cream, cream cheese, and creamed cottage cheese. Whole-milk cheeses. Whole or 2% milk that is liquid, evaporated, or condensed. Whole buttermilk. Cream sauce or high-fat cheese sauce. Yogurt that is made from whole milk. Fats and oils Meat fat, or shortening. Cocoa butter, hydrogenated oils, palm oil, coconut oil, palm kernel oil. Solid fats and shortenings, including bacon fat, salt pork, lard, and butter. Nondairy cream substitutes. Salad dressings with cheese or sour cream. Beverages Regular sodas and juice drinks with added sugar. Sweets and desserts Frosting. Pudding. Cookies. Cakes. Pies. Milk chocolate or white chocolate. Buttered syrups. Full-fat ice cream or ice cream drinks. The items listed above may not be a complete list of foods and drinks to avoid. Contact a dietitian for more information. Summary Heart-healthy meal planning includes eating less unhealthy fats, eating more healthy fats, and making other changes in your diet. Eat a balanced diet. This includes fruits and vegetables, low-fat or nonfat dairy, lean protein, nuts and legumes, whole grains, and heart-healthy oils and fats. This information is not intended to replace advice given to you by your health care provider. Make sure you discuss any questions you have with your health care provider. Document Revised: 05/21/2021 Document Reviewed: 05/21/2021 Elsevier Patient Education  2024 [employer].

## 2022-10-17 ENCOUNTER — Encounter: Payer: Self-pay | Admitting: *Deleted

## 2022-10-17 ENCOUNTER — Ambulatory Visit: Payer: Self-pay | Admitting: *Deleted

## 2022-10-17 NOTE — Telephone Encounter (Signed)
[id number] 

## 2022-10-18 ENCOUNTER — Ambulatory Visit (HOSPITAL_COMMUNITY)
Admission: RE | Admit: 2022-10-18 | Discharge: 2022-10-18 | Disposition: A | Discharge status: Home / Self Care | Payer: Medicare PPO | Source: Ambulatory Visit | Attending: Family Medicine | Admitting: Family Medicine

## 2022-10-18 ENCOUNTER — Encounter (HOSPITAL_COMMUNITY): Payer: Self-pay

## 2022-10-18 DIAGNOSIS — Z1231 Encounter for screening mammogram for malignant neoplasm of breast: Secondary | ICD-10-CM | POA: Diagnosis not present
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
## 2022-10-22 ENCOUNTER — Encounter: Payer: Self-pay | Admitting: Family Medicine

## 2022-10-22 ENCOUNTER — Ambulatory Visit (INDEPENDENT_AMBULATORY_CARE_PROVIDER_SITE_OTHER): Payer: Medicare PPO | Admitting: Family Medicine

## 2022-10-22 VITALS — BP 127/80 | HR 89 | Ht 65.0 in | Wt 235.1 lb

## 2022-10-22 DIAGNOSIS — Z0001 Encounter for general adult medical examination with abnormal findings: Secondary | ICD-10-CM

## 2022-10-22 DIAGNOSIS — F339 Major depressive disorder, recurrent, unspecified: Secondary | ICD-10-CM

## 2022-10-22 DIAGNOSIS — Z6841 Body Mass Index (BMI) 40.0 and over, adult: Secondary | ICD-10-CM

## 2022-10-22 DIAGNOSIS — R195 Other fecal abnormalities: Secondary | ICD-10-CM | POA: Diagnosis not present

## 2022-10-22 MED ORDER — SERTRALINE HCL 25 MG PO TABS: 25.0000 mg | ORAL_TABLET | Freq: Every day | ORAL | 3 refills | Status: AC

## 2022-10-22 MED ORDER — ALBUTEROL SULFATE (2.5 MG/3ML) 0.083% IN NEBU: 2.5000 mg | INHALATION_SOLUTION | Freq: Four times a day (QID) | RESPIRATORY_TRACT | 1 refills | Status: AC | PRN

## 2022-10-22 NOTE — Patient Instructions (Signed)
F/U in 8 weeks re evaluate depression and chronic problems , call if you need me sooner  Albuterol neb treatments are prescribed\  Sertraline is prescribed for depression  Call for referral to therapy once you decide on this please  PLEASE complete form for colonoscopy and get this done  Thankful you have been keeping appointments with the health care team    Vegetables, water, fruit , chicken and eggs!  Thanks for choosing [facility], we consider it a privelige to serve you. Please get the shingles vaccines at your phaermact

## 2022-10-23 ENCOUNTER — Encounter: Payer: Self-pay | Admitting: Family Medicine

## 2022-10-23 DIAGNOSIS — F339 Major depressive disorder, recurrent, unspecified: Secondary | ICD-10-CM | POA: Insufficient documentation
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
## 2022-10-23 NOTE — Assessment & Plan Note (Signed)
Start sertraline 25 g daily Recommended therapy which she needs , will consider it Re eval in 8 weeks

## 2022-10-23 NOTE — Assessment & Plan Note (Signed)

## 2022-10-23 NOTE — Assessment & Plan Note (Signed)
  Patient re-educated about  the importance of commitment to a  minimum of 150 minutes of exercise per week as able.  The importance of healthy food choices with portion control discussed, as well as eating regularly and within a 12 hour window most days. The need to choose "clean , green" food 50 to 75% of the time is discussed, as well as to make water the primary drink and set a goal of 64 ounces water daily.       10/22/2022    1:39 PM 09/12/2022    1:34 PM 08/27/2022    1:42 PM  Weight /BMI  Weight 235 lb 1.9 oz 224 lb 4.8 oz 243 lb  Height 5\' 5"  (1.651 m) 5' 5.5" (1.664 m) 5' 5.5" (1.664 m)  BMI 39.13 kg/m2 36.76 kg/m2 39.82 kg/m2

## 2022-10-23 NOTE — Assessment & Plan Note (Signed)
Nweeds to follow through with colonoscopy

## 2022-10-27 DIAGNOSIS — J45909 Unspecified asthma, uncomplicated: Secondary | ICD-10-CM

## 2022-10-27 DIAGNOSIS — E785 Hyperlipidemia, unspecified: Secondary | ICD-10-CM

## 2022-10-29 ENCOUNTER — Telehealth: Payer: Self-pay | Admitting: Family Medicine

## 2022-10-29 NOTE — Telephone Encounter (Signed)
Called after hours requesting call back from provider.

## 2022-10-30 ENCOUNTER — Encounter: Payer: Self-pay | Admitting: *Deleted

## 2022-10-30 ENCOUNTER — Ambulatory Visit: Payer: Self-pay | Admitting: *Deleted

## 2022-10-30 NOTE — Patient Outreach (Signed)
Care Coordination   Follow Up Visit Note   10/30/2022  Name: Angela Chase MRN: [id number] DOB: 07-20-1945  Angela Chase is a 77 y.o. year old female who sees Kerri Perches, MD for primary care. I spoke with [name] by phone today.  What matters to the patients health and wellness today?  Find Help in My Community.   Goals Addressed               This Visit's Progress     Find Help in My Community. (pt-stated)   On track     Care Coordination Interventions:  Interventions Today    Flowsheet Row Most Recent Value  Chronic Disease   Chronic disease during today's visit Other  [Food Insecurity, Lack of Reliable Transportation, Financial Difficulties, Inadequate Housing, Recurrent Falls, Social Isolation, Limited Family Support]  General Interventions   General Interventions Discussed/Reviewed General Interventions Discussed, Health Screening, General Interventions Reviewed, Annual Eye Exam, Labs, Vaccines, Doctor Visits, [facility], Level of Care, Communication with, Horticulturist, commercial (DME)  [Encouraged]  Labs Kidney Function  [Encouraged]  Vaccines COVID-19, Flu, Pneumonia, RSV, Shingles, Tetanus/Pertussis/Diphtheria  [Encouraged]  Doctor Visits Discussed/Reviewed Doctor Visits Discussed, Specialist, Doctor Visits Reviewed, Annual Wellness Visits, PCP  [Encouraged]  Health Screening Bone Density, Colonoscopy, Mammogram  [Encouraged]  Durable Medical Equipment (DME) Walker, BP Cuff, Other  [Eye Glasses & Cane]  PCP/Specialist Visits Compliance with follow-up visit  [Encouraged]  Communication with PCP/Specialists, RN, Pharmacists  [Encouraged]  Level of Care Adult Daycare, Applications, Assisted Living, Personal Care Services  [Encouraged]  Applications Medicaid, Personal Care Services  [Encouraged]  Exercise Interventions   Exercise Discussed/Reviewed Exercise Discussed, Exercise Reviewed, Physical Activity, Weight Managment,  Assistive device use and maintanence  [Encouraged]  Physical Activity Discussed/Reviewed Physical Activity Discussed, Home Exercise Program (HEP), Physical Activity Reviewed, Types of exercise  [Encouraged]  Weight Management Weight loss  [Encouraged]  Education Interventions   Education Provided Provided Therapist, sports, Provided Web-based Education, Provided Education  [Encouraged]  Provided Engineer, petroleum On Nutrition, Mental Health/Coping with Illness, When to see the doctor, Eye Care, Labs, Applications, Exercise, Medication, Development worker, community, [facility], Other  [Encouraged]  Ship broker, Personal Care Services  [Encouraged]  Mental Health Interventions   Mental Health Discussed/Reviewed Mental Health Discussed, Depression, Mental Health Reviewed, Grief and Loss, Substance Abuse, Suicide, Other, Coping Strategies, Crisis, Anxiety  [Domestic Violence]  Nutrition Interventions   Nutrition Discussed/Reviewed Nutrition Discussed, Adding fruits and vegetables, Nutrition Reviewed, Fluid intake, Portion sizes, Carbohydrate meal planning, Decreasing sugar intake, Increasing proteins, Decreasing fats, Decreasing salt  [Encouraged]  Pharmacy Interventions   Pharmacy Dicussed/Reviewed Pharmacy Topics Discussed, Medication Adherence, Pharmacy Topics Reviewed, Medications and their functions, Affording Medications  [Encouraged]  Safety Interventions   Safety Discussed/Reviewed Safety Discussed, Safety Reviewed, Fall Risk, Home Safety  [Encouraged]  Home Safety Assistive Devices, Need for home safety assessment, Refer for home visit, Refer for community resources  [Encouraged]  Advanced Directive Interventions   Advanced Directives Discussed/Reviewed Advanced Directives Discussed  [Encouraged]      Active Listening & Reflection Utilized.  Verbalization of Feelings Encouraged.  Emotional Support Provided. Feelings of Anxiety & Stress Validated. Symptoms of Paranoia  Acknowledged. Problem Solving Interventions Employed. Task-Centered Solutions Implemented.   Solution-Focused Strategies Activated. Acceptance & Commitment Therapy Performed. Brief Cognitive Behavioral Therapy Initiated. Client-Centered Therapy Indicated. Encouraged Administration of Medications Exactly as Prescribed. Deep Breathing Exercises, Relaxation Techniques, & Mindfulness Meditation Strategies Taught & Implementation Encouraged Daily. Encouraged Increased Level of Activity & Exercise,  as Tolerated. Continued Review of The Following List of [employer], Nurse, adult, & Resources to [employer] Understanding & Offer Assistance with Application Completion: ~ [employer] Senior Farmers' Primary school teacher ~ [employer] Support & Nutrition Program  ~ Armed forces logistics/support/administrative officer, Pharmacist, hospital in [place] ~ [employer] Application ~ Scientist, forensic ~ Engineer, drilling ~ TEFL teacher ~ Low Income Energy Assistance Program Application ~ [employer] ~ Transportation Options ~ [employer] Social research officer, government ~ 2023 Medicaid Tips ~ [employer] Energy Application ~ Community education officer in [place] ~ Low Income Housing in [place], [place] ~ [employer] ~ Section 8 Statistician ~ Family Caregivers ~ Home Care Services & Resources ~ Home Health Care Agencies ~ In-Home Care & Respite Agencies ~ Personal Care Services Application ~ Theatre manager Providers Encouraged Careers information officer with [employer], Services & Resources of Interest in [place], in An Effort to [employer], Surveyor, quantity, English as a second language teacher, [employer]. Encouraged Completion of Application for Medicaid & Submission to The [facility] of Social Services [phone]), for Processing. Encouraged  Completion of Application for Personal Care Services & Submission to [facility] [phone]), for Processing, if Approved for Medicaid, through The [facility] of Social Services [phone]). Encouraged Careers information officer with CSW (# [phone]), if You Have Questions, Need Assistance, or If Additional Social Work Needs Are Identified Between Now & Our Next Scheduled Follow-Up Outreach Call.      SDOH assessments and interventions completed:  Yes.  Care Coordination Interventions:  Yes, provided.   Follow up plan: Follow up call scheduled for 11/08/2022 at 11:30 am.  Encounter Outcome:  Pt. Visit Completed.   Danford Bad, BSW, MSW, LCSW  Licensed Restaurant manager, fast food [facility]  Mailing [place] [address], [place], [place] [address] Physical Address-[address], [place], [place] [address] Toll Free Main # [phone] Fax # [phone] Cell # [phone] [email or web address]

## 2022-10-30 NOTE — Patient Instructions (Signed)
Visit Information  Thank you for taking time to visit with me today. Please don't hesitate to contact me if I can be of assistance to you.   Following are the goals we discussed today:   Goals Addressed               This Visit's Progress     Find Help in My Community. (pt-stated)   On track     Care Coordination Interventions:  Interventions Today    Flowsheet Row Most Recent Value  Chronic Disease   Chronic disease during today's visit Other  [Food Insecurity, Lack of Reliable Transportation, Financial Difficulties, Inadequate Housing, Recurrent Falls, Social Isolation, Limited Family Support]  General Interventions   General Interventions Discussed/Reviewed General Interventions Discussed, Health Screening, General Interventions Reviewed, Annual Eye Exam, Labs, Vaccines, Doctor Visits, [facility], Level of Care, Communication with, Horticulturist, commercial (DME)  [Encouraged]  Labs Kidney Function  [Encouraged]  Vaccines COVID-19, Flu, Pneumonia, RSV, Shingles, Tetanus/Pertussis/Diphtheria  [Encouraged]  Doctor Visits Discussed/Reviewed Doctor Visits Discussed, Specialist, Doctor Visits Reviewed, Annual Wellness Visits, PCP  [Encouraged]  Health Screening Bone Density, Colonoscopy, Mammogram  [Encouraged]  Durable Medical Equipment (DME) Walker, BP Cuff, Other  [Eye Glasses & Cane]  PCP/Specialist Visits Compliance with follow-up visit  [Encouraged]  Communication with PCP/Specialists, RN, Pharmacists  [Encouraged]  Level of Care Adult Daycare, Applications, Assisted Living, Personal Care Services  [Encouraged]  Applications Medicaid, Personal Care Services  [Encouraged]  Exercise Interventions   Exercise Discussed/Reviewed Exercise Discussed, Exercise Reviewed, Physical Activity, Weight Managment, Assistive device use and maintanence  [Encouraged]  Physical Activity Discussed/Reviewed Physical Activity Discussed, Home Exercise Program (HEP), Physical Activity Reviewed,  Types of exercise  [Encouraged]  Weight Management Weight loss  [Encouraged]  Education Interventions   Education Provided Provided Therapist, sports, Provided Web-based Education, Provided Education  [Encouraged]  Provided Engineer, petroleum On Nutrition, Mental Health/Coping with Illness, When to see the doctor, Eye Care, Labs, Applications, Exercise, Medication, Development worker, community, [facility], Other  [Encouraged]  Ship broker, Personal Care Services  [Encouraged]  Mental Health Interventions   Mental Health Discussed/Reviewed Mental Health Discussed, Depression, Mental Health Reviewed, Grief and Loss, Substance Abuse, Suicide, Other, Coping Strategies, Crisis, Anxiety  [Domestic Violence]  Nutrition Interventions   Nutrition Discussed/Reviewed Nutrition Discussed, Adding fruits and vegetables, Nutrition Reviewed, Fluid intake, Portion sizes, Carbohydrate meal planning, Decreasing sugar intake, Increasing proteins, Decreasing fats, Decreasing salt  [Encouraged]  Pharmacy Interventions   Pharmacy Dicussed/Reviewed Pharmacy Topics Discussed, Medication Adherence, Pharmacy Topics Reviewed, Medications and their functions, Affording Medications  [Encouraged]  Safety Interventions   Safety Discussed/Reviewed Safety Discussed, Safety Reviewed, Fall Risk, Home Safety  [Encouraged]  Home Safety Assistive Devices, Need for home safety assessment, Refer for home visit, Refer for community resources  [Encouraged]  Advanced Directive Interventions   Advanced Directives Discussed/Reviewed Advanced Directives Discussed  [Encouraged]      Active Listening & Reflection Utilized.  Verbalization of Feelings Encouraged.  Emotional Support Provided. Feelings of Anxiety & Stress Validated. Symptoms of Paranoia Acknowledged. Problem Solving Interventions Employed. Task-Centered Solutions Implemented.   Solution-Focused Strategies Activated. Acceptance & Commitment Therapy Performed. Brief  Cognitive Behavioral Therapy Initiated. Client-Centered Therapy Indicated. Encouraged Administration of Medications Exactly as Prescribed. Deep Breathing Exercises, Relaxation Techniques, & Mindfulness Meditation Strategies Taught & Implementation Encouraged Daily. Encouraged Increased Level of Activity & Exercise, as Tolerated. Continued Review of The Following List of Levi [employer]uss, Walt Disney, & Resources to [employer] Offer Assistance with Application Completion: ~ [employer] Senior Farmers' Market Nutrition  Program Application ~ [employer] & Nutrition Program  ~ Armed forces logistics/support/administrative officer, Arts administrator Pantries & Soup Kitchens in [place] ~ [employer] Sciences Corporation ~ Scientist, forensic ~ Emergency Assistance Programs ~ TEFL teacher ~ Low Income Energy Assistance Program Application ~ American Financial Health Financial Assistance Application ~ Transportation Options ~ [employer] Manufacturing engineer Brochure ~ 2023 Medicaid Tips ~ [employer] Application ~ Community education officer in [place] ~ Low Income Housing in [place], [place] ~ [employer] ~ Section 8 Statistician ~ Family Caregivers ~ Home Care Services & Resources ~ Home Health Care Agencies ~ In-Home Care & Respite Agencies ~ Personal Care Services Application ~ Theatre manager Providers Encouraged Careers information officer with [employer], Services & Resources of Interest in [place], in An Effort to [employer], Surveyor, quantity, English as a second language teacher, [employer]. Encouraged Completion of Application for Medicaid & Submission to [facility] of Social Services [phone]), for Processing. Encouraged Completion of Application for Personal Care Services & Submission to [facility] [phone]), for Processing, if Approved for Medicaid, through The [facility] of  Social Services [phone]). Encouraged Careers information officer with CSW (# [phone]), if You Have Questions, Need Assistance, or If Additional Social Work Needs Are Identified Between Now & Our Next Scheduled Follow-Up Outreach Call.      Our next appointment is by telephone on 11/08/2022 at 11:30 am.  Please call the care guide team at [phone] if you need to cancel or reschedule your appointment.   If you are experiencing a Mental Health or Behavioral Health Crisis or need someone to talk to, please call the Suicide and Crisis Lifeline: 988 call the [place] National Suicide Prevention Lifeline: 574 283 0338 or TTY: 854-396-7514 TTY 236 139 4919) to talk to a trained counselor call 1-800-273-TALK (toll free, 24 hour hotline) go to [facility] [address] [place] [phone]) call the [facility] Crisis Line: [phone] call 911  Patient verbalizes understanding of instructions and care plan provided today and agrees to view in MyChart. Active MyChart status and patient understanding of how to access instructions and care plan via MyChart confirmed with patient.     Telephone follow up appointment with care management team member scheduled for:  11/08/2022 at 11:30 am.  Danford Bad, BSW, MSW, LCSW  Licensed Clinical Social Worker  [facility]  Mailing [place]. [address], [place], [place] [address] Physical Address-[address], [place], [place] [address] Toll Free Main # [phone] Fax # [phone] Cell # [phone] [email or web address]

## 2022-11-05 ENCOUNTER — Telehealth: Payer: Self-pay | Admitting: Family Medicine

## 2022-11-05 ENCOUNTER — Other Ambulatory Visit: Payer: Self-pay | Admitting: Family Medicine

## 2022-11-05 MED ORDER — POTASSIUM CHLORIDE CRYS ER 20 MEQ PO TBCR: 20.0000 meq | EXTENDED_RELEASE_TABLET | Freq: Every day | ORAL | 1 refills | Status: AC

## 2022-11-05 MED ORDER — FUROSEMIDE 40 MG PO TABS: 40.0000 mg | ORAL_TABLET | Freq: Every day | ORAL | 1 refills | Status: AC

## 2022-11-05 NOTE — Telephone Encounter (Signed)
Message left that lasix and portassium have been prescribed

## 2022-11-08 ENCOUNTER — Encounter: Payer: Self-pay | Admitting: *Deleted

## 2022-11-08 ENCOUNTER — Ambulatory Visit: Payer: Self-pay | Admitting: *Deleted

## 2022-11-08 NOTE — Patient Instructions (Signed)
Visit Information  Thank you for taking time to visit with me today. Please don't hesitate to contact me if I can be of assistance to you.   Following are the goals we discussed today:   Goals Addressed               This Visit's Progress     Find Help in My Community. (pt-stated)   On track     RCare Coordination Interventions:  Interventions Today    Flowsheet Row Most Recent Value  Chronic Disease   Chronic disease during today's visit Other  [Food Insecurity, Lack of Reliable Transportation, Financial Difficulties, Inadequate Housing, Recurrent Falls, Social Isolation, Limited Family Support]  General Interventions   General Interventions Discussed/Reviewed General Interventions Discussed, Health Screening, General Interventions Reviewed, Annual Eye Exam, Labs, Vaccines, Doctor Visits, [facility], Level of Care, Communication with, Horticulturist, commercial (DME)  [Encouraged]  Labs Kidney Function  [Encouraged]  Vaccines COVID-19, Flu, Pneumonia, RSV, Shingles, Tetanus/Pertussis/Diphtheria  [Encouraged]  Doctor Visits Discussed/Reviewed Doctor Visits Discussed, Specialist, Doctor Visits Reviewed, Annual Wellness Visits, PCP  [Encouraged]  Health Screening Bone Density, Colonoscopy, Mammogram  [Encouraged]  Durable Medical Equipment (DME) Walker, BP Cuff, Other  [Eye Glasses & Cane]  PCP/Specialist Visits Compliance with follow-up visit  [Encouraged]  Communication with PCP/Specialists, RN, Pharmacists  [Encouraged]  Level of Care Adult Daycare, Applications, Assisted Living, Personal Care Services  [Encouraged]  Applications Medicaid, Personal Care Services  [Encouraged]  Exercise Interventions   Exercise Discussed/Reviewed Exercise Discussed, Exercise Reviewed, Physical Activity, Weight Managment, Assistive device use and maintanence  [Encouraged]  Physical Activity Discussed/Reviewed Physical Activity Discussed, Home Exercise Program (HEP), Physical Activity  Reviewed, Types of exercise  [Encouraged]  Weight Management Weight loss  [Encouraged]  Education Interventions   Education Provided Provided Therapist, sports, Provided Web-based Education, Provided Education  [Encouraged]  Provided Engineer, petroleum On Nutrition, Mental Health/Coping with Illness, When to see the doctor, Eye Care, Labs, Applications, Exercise, Medication, Development worker, community, [facility], Other  [Encouraged]  Ship broker, Personal Care Services  [Encouraged]  Mental Health Interventions   Mental Health Discussed/Reviewed Mental Health Discussed, Depression, Mental Health Reviewed, Grief and Loss, Substance Abuse, Suicide, Other, Coping Strategies, Crisis, Anxiety  [Domestic Violence]  Nutrition Interventions   Nutrition Discussed/Reviewed Nutrition Discussed, Adding fruits and vegetables, Nutrition Reviewed, Fluid intake, Portion sizes, Carbohydrate meal planning, Decreasing sugar intake, Increasing proteins, Decreasing fats, Decreasing salt  [Encouraged]  Pharmacy Interventions   Pharmacy Dicussed/Reviewed Pharmacy Topics Discussed, Medication Adherence, Pharmacy Topics Reviewed, Medications and their functions, Affording Medications  [Encouraged]  Safety Interventions   Safety Discussed/Reviewed Safety Discussed, Safety Reviewed, Fall Risk, Home Safety  [Encouraged]  Home Safety Assistive Devices, Need for home safety assessment, Refer for home visit, Refer for community resources  [Encouraged]  Advanced Directive Interventions   Advanced Directives Discussed/Reviewed Advanced Directives Discussed  [Encouraged]       Active Listening & Reflection Utilized.  Verbalization of Feelings Encouraged.  Emotional Support Provided. Diminished Feelings of Anxiety & Stress Validated. Symptoms of Paranoia Acknowledged. Problem Solving Interventions Employed. Task-Centered Solutions Implemented.   Solution-Focused Strategies Activated. Acceptance & Commitment Therapy  Performed. Brief Cognitive Behavioral Therapy Initiated. Client-Centered Therapy Indicated. Please Administer Psychotropic Medication (Zoloft/Sertraline 25 MG, PO, Daily) Exactly As Prescribed, for Management & Reduction in Symptoms of Depression, Obsessive-Compulsive Disorder, Panic Disorder, Posttraumatic Stress Disorder, & Social Anxiety Disorder. Reviewed Deep Breathing Exercises, Relaxation Techniques, & Mindfulness Meditation Strategies, & Encouraged Implementation Daily. Encouraged Careers information officer with The Following List of [employer],  Services, & Resources, In An Effort to Obtain Food & Nutritional Assistance, Rent & Utility Assistance, Financial & Medical Assistance, Low-Income Housing Insurance underwriter, In-Home Care & Respite Assistance, Etc.: ~ [place] Senior Farmers' Market Nutrition Program Application ~ [employer] Support & Nutrition Program  ~ Armed forces logistics/support/administrative officer, Arts administrator Pantries & Soup Kitchens in [place] ~ [employer] ~ Scientist, forensic ~ Engineer, drilling ~ TEFL teacher ~ Low Income Energy Assistance Program Application ~ American Financial Health Financial Assistance Application ~ Transportation Options ~ [employer] Manufacturing engineer Brochure ~ 2023 Medicaid Tips ~ [employer] Application ~ Community education officer in [place] ~ Low Income Housing in [place], [place] ~ [employer] ~ Section 8 Statistician ~ Family Caregivers ~ Home Care Services & Resources ~ Home Health Care Agencies ~ In-Home Care & Respite Agencies ~ Personal Care Services Application ~ Personal Care Services Agency Providers Encouraged Completion of Application for Medicaid & Submission to The [facility] of Social Services [phone]), for Processing. Encouraged Completion of Application for Personal Care Services & Submission to [facility] [phone]), for Processing, if Approved for Medicaid, through The [facility] of Social Services [phone]). Encouraged Careers information officer with CSW (# [phone]), if You Have Questions, Need Assistance, or If Additional Social Work Needs Are Identified Between Now & Our Next Scheduled Follow-Up Outreach Call.      Our next appointment is by telephone on 11/21/2022 at 2:45 pm.  Please call the care guide team at [phone] if you need to cancel or reschedule your appointment.   If you are experiencing a Mental Health or Behavioral Health Crisis or need someone to talk to, please call the Suicide and Crisis Lifeline: 988 call the [place] National Suicide Prevention Lifeline: 614-620-4023 or TTY: 303-742-9858 TTY 904-519-5926) to talk to a trained counselor call 1-800-273-TALK (toll free, 24 hour hotline) go to [facility] [address] [place] [phone]) call the [facility] Crisis Line: [phone] call 911  Patient verbalizes understanding of instructions and care plan provided today and agrees to view in MyChart. Active MyChart status and patient understanding of how to access instructions and care plan via MyChart confirmed with patient.     Telephone follow up appointment with care management team member scheduled for:  11/21/2022 at 2:45 pm.  Danford Bad, BSW, MSW, LCSW  Licensed Clinical Social Worker  [facility]  Mailing [place]. [address] [place], [place] [address] Physical Address[address] [place], [place] [address] Toll Free Main # [phone] Fax # [phone] Cell # [phone] [email or web address]

## 2022-11-08 NOTE — Patient Outreach (Signed)
Care Coordination   Follow Up Visit Note   11/08/2022  Name: Angela Chase MRN: [id number] DOB: 26-Feb-1946  Angela Chase is a 77 y.o. year old female who sees Kerri Perches, MD for primary care. I spoke with [name] by phone today.  What matters to the patients health and wellness today?  Find Help in My Community.   Goals Addressed               This Visit's Progress     Find Help in My Community. (pt-stated)   On track     RCare Coordination Interventions:  Interventions Today    Flowsheet Row Most Recent Value  Chronic Disease   Chronic disease during today's visit Other  [Food Insecurity, Lack of Reliable Transportation, Financial Difficulties, Inadequate Housing, Recurrent Falls, Social Isolation, Limited Family Support]  General Interventions   General Interventions Discussed/Reviewed General Interventions Discussed, Health Screening, General Interventions Reviewed, Annual Eye Exam, Labs, Vaccines, Doctor Visits, [facility], Level of Care, Communication with, Horticulturist, commercial (DME)  [Encouraged]  Labs Kidney Function  [Encouraged]  Vaccines COVID-19, Flu, Pneumonia, RSV, Shingles, Tetanus/Pertussis/Diphtheria  [Encouraged]  Doctor Visits Discussed/Reviewed Doctor Visits Discussed, Specialist, Doctor Visits Reviewed, Annual Wellness Visits, PCP  [Encouraged]  Health Screening Bone Density, Colonoscopy, Mammogram  [Encouraged]  Durable Medical Equipment (DME) Walker, BP Cuff, Other  [Eye Glasses & Cane]  PCP/Specialist Visits Compliance with follow-up visit  [Encouraged]  Communication with PCP/Specialists, RN, Pharmacists  [Encouraged]  Level of Care Adult Daycare, Applications, Assisted Living, Personal Care Services  [Encouraged]  Applications Medicaid, Personal Care Services  [Encouraged]  Exercise Interventions   Exercise Discussed/Reviewed Exercise Discussed, Exercise Reviewed, Physical Activity, Weight Managment,  Assistive device use and maintanence  [Encouraged]  Physical Activity Discussed/Reviewed Physical Activity Discussed, Home Exercise Program (HEP), Physical Activity Reviewed, Types of exercise  [Encouraged]  Weight Management Weight loss  [Encouraged]  Education Interventions   Education Provided Provided Therapist, sports, Provided Web-based Education, Provided Education  [Encouraged]  Provided Engineer, petroleum On Nutrition, Mental Health/Coping with Illness, When to see the doctor, Eye Care, Labs, Applications, Exercise, Medication, Development worker, community, [facility], Other  [Encouraged]  Ship broker, Personal Care Services  [Encouraged]  Mental Health Interventions   Mental Health Discussed/Reviewed Mental Health Discussed, Depression, Mental Health Reviewed, Grief and Loss, Substance Abuse, Suicide, Other, Coping Strategies, Crisis, Anxiety  [Domestic Violence]  Nutrition Interventions   Nutrition Discussed/Reviewed Nutrition Discussed, Adding fruits and vegetables, Nutrition Reviewed, Fluid intake, Portion sizes, Carbohydrate meal planning, Decreasing sugar intake, Increasing proteins, Decreasing fats, Decreasing salt  [Encouraged]  Pharmacy Interventions   Pharmacy Dicussed/Reviewed Pharmacy Topics Discussed, Medication Adherence, Pharmacy Topics Reviewed, Medications and their functions, Affording Medications  [Encouraged]  Safety Interventions   Safety Discussed/Reviewed Safety Discussed, Safety Reviewed, Fall Risk, Home Safety  [Encouraged]  Home Safety Assistive Devices, Need for home safety assessment, Refer for home visit, Refer for community resources  [Encouraged]  Advanced Directive Interventions   Advanced Directives Discussed/Reviewed Advanced Directives Discussed  [Encouraged]       Active Listening & Reflection Utilized.  Verbalization of Feelings Encouraged.  Emotional Support Provided. Diminished Feelings of Anxiety & Stress Validated. Symptoms of Paranoia  Acknowledged. Problem Solving Interventions Employed. Task-Centered Solutions Implemented.   Solution-Focused Strategies Activated. Acceptance & Commitment Therapy Performed. Brief Cognitive Behavioral Therapy Initiated. Client-Centered Therapy Indicated. Please Administer Psychotropic Medication (Zoloft/Sertraline 25 MG, PO, Daily) Exactly As Prescribed, for Management & Reduction in Symptoms of Depression, Obsessive-Compulsive Disorder, Panic Disorder, Posttraumatic Stress  Disorder, & Social Anxiety Disorder. Reviewed Deep Breathing Exercises, Relaxation Techniques, & Mindfulness Meditation Strategies, & Encouraged Implementation Daily. Encouraged Careers information officer with The Following List of [employer], Walt Disney, [employer], In An Effort to [employer] & Nutritional Assistance, Rent & TEFL teacher, Designer, multimedia, Low-Income Housing Insurance underwriter, In-Home Care & Respite Assistance, Etc.: ~ [place] [employer] Primary school teacher ~ [employer] Support & Nutrition Program  ~ Armed forces logistics/support/administrative officer, Stage manager & Soup Kitchens in [place] ~ Computer Sciences Corporation ~ Scientist, forensic ~ Engineer, drilling ~ TEFL teacher ~ Low Income Energy Assistance Program Application ~ American Financial Health Financial Assistance Application ~ Transportation Options ~ [employer] Social research officer, government ~ 2023 Medicaid Tips ~ OGE Energy Application ~ Community education officer in [place] ~ Low Income Housing in [place], [place] ~ FirstEnergy Corp ~ Section 8 Statistician ~ Family Caregivers ~ Home Care Services & Resources ~ Home Health Care Agencies ~ In-Home Care & Respite Agencies ~ Personal Care Services Application ~ Personal Care Services Agency Providers Encouraged Completion of Application for Medicaid & Submission to The [facility] Department of Social Services [phone]), for Processing. Encouraged Completion of Application for Personal Care Services & Submission to [facility] [phone]), for Processing, if Approved for Medicaid, through The [facility] of Social Services [phone]). Encouraged Careers information officer with CSW (# [phone]), if You Have Questions, Need Assistance, or If Additional Social Work Needs Are Identified Between Now & Our Next Scheduled Follow-Up Outreach Call.      SDOH assessments and interventions completed:  Yes.  Care Coordination Interventions:  Yes, provided.   Follow up plan: Follow up call scheduled for 11/21/2022 at 2:45 pm.  Encounter Outcome:  Pt. Visit Completed.   Danford Bad, BSW, MSW, LCSW  Licensed Restaurant manager, fast food [facility]  Mailing [place] [address], [place], [place] [address] Physical Address[address], [place], [place] [address] Toll Free Main # [phone] Fax # [phone] Cell # [phone] [email or web address]

## 2022-11-21 ENCOUNTER — Ambulatory Visit: Payer: Self-pay | Admitting: *Deleted

## 2022-11-21 ENCOUNTER — Encounter: Payer: Self-pay | Admitting: *Deleted

## 2022-11-21 NOTE — Patient Outreach (Signed)
Care Coordination   Follow Up Visit Note   11/21/2022  Name: AMAN BATLEY MRN: [id number] DOB: 1945/09/10  Angela Chase is a 77 y.o. year old female who sees Kerri Perches, MD for primary care. I spoke with [name] by phone today.  What matters to the patients health and wellness today?  Find Help in My Community.   Goals Addressed               This Visit's Progress     Find Help in My Community. (pt-stated)   On track     Care Coordination Interventions:  Interventions Today    Flowsheet Row Most Recent Value  Chronic Disease   Chronic disease during today's visit Other  [Food Insecurity, Lack of Reliable Transportation, Financial Difficulties, Inadequate Housing, Recurrent Falls, Social Isolation, Limited Family Support]  General Interventions   General Interventions Discussed/Reviewed General Interventions Discussed, Health Screening, General Interventions Reviewed, Annual Eye Exam, Labs, Vaccines, Doctor Visits, [facility], Level of Care, Communication with, Horticulturist, commercial (DME)  [Encouraged]  Labs Kidney Function  [Encouraged]  Vaccines COVID-19, Flu, Pneumonia, RSV, Shingles, Tetanus/Pertussis/Diphtheria  [Encouraged]  Doctor Visits Discussed/Reviewed Doctor Visits Discussed, Specialist, Doctor Visits Reviewed, Annual Wellness Visits, PCP  [Encouraged]  Health Screening Bone Density, Colonoscopy, Mammogram  [Encouraged]  Durable Medical Equipment (DME) Walker, BP Cuff, Other  [Eye Glasses & Cane]  PCP/Specialist Visits Compliance with follow-up visit  [Encouraged]  Communication with PCP/Specialists, RN, Pharmacists  [Encouraged]  Level of Care Adult Daycare, Applications, Assisted Living, Personal Care Services  [Encouraged]  Applications Medicaid, Personal Care Services  [Encouraged]  Exercise Interventions   Exercise Discussed/Reviewed Exercise Discussed, Exercise Reviewed, Physical Activity, Weight Managment,  Assistive device use and maintanence  [Encouraged]  Physical Activity Discussed/Reviewed Physical Activity Discussed, Home Exercise Program (HEP), Physical Activity Reviewed, Types of exercise  [Encouraged]  Weight Management Weight loss  [Encouraged]  Education Interventions   Education Provided Provided Therapist, sports, Provided Web-based Education, Provided Education  [Encouraged]  Provided Engineer, petroleum On Nutrition, Mental Health/Coping with Illness, When to see the doctor, Eye Care, Labs, Applications, Exercise, Medication, Development worker, community, [facility], Other  [Encouraged]  Ship broker, Personal Care Services  [Encouraged]  Mental Health Interventions   Mental Health Discussed/Reviewed Mental Health Discussed, Depression, Mental Health Reviewed, Grief and Loss, Substance Abuse, Suicide, Other, Coping Strategies, Crisis, Anxiety  [Domestic Violence]  Nutrition Interventions   Nutrition Discussed/Reviewed Nutrition Discussed, Adding fruits and vegetables, Nutrition Reviewed, Fluid intake, Portion sizes, Carbohydrate meal planning, Decreasing sugar intake, Increasing proteins, Decreasing fats, Decreasing salt  [Encouraged]  Pharmacy Interventions   Pharmacy Dicussed/Reviewed Pharmacy Topics Discussed, Medication Adherence, Pharmacy Topics Reviewed, Medications and their functions, Affording Medications  [Encouraged]  Safety Interventions   Safety Discussed/Reviewed Safety Discussed, Safety Reviewed, Fall Risk, Home Safety  [Encouraged]  Home Safety Assistive Devices, Need for home safety assessment, Refer for home visit, Refer for community resources  [Encouraged]  Advanced Directive Interventions   Advanced Directives Discussed/Reviewed Advanced Directives Discussed  [Encouraged]       Active Listening & Reflection Utilized.  Verbalization of Feelings Encouraged.  Emotional Support Provided. Diminished Symptoms of Anxiety & Stress Acknowledged. Problem Solving  Interventions Employed. Task-Centered Solutions Implemented.   Solution-Focused Strategies Activated. Acceptance & Commitment Therapy Performed. Brief Cognitive Behavioral Therapy Initiated. Client-Centered Therapy Indicated. Encouraged Implementation of Deep Breathing Exercises, Relaxation Techniques, & Mindfulness Meditation Strategies Daily. Encouraged Administration of Medications, Exactly as Prescribed. Encouraged Increased Level of Activity & Exercise, as Tolerated. Encouraged  Contact with [employer], Nurse, adult, Field seismologist of Interest in [place], In An Effort to [employer]. [employer] & Nutritional Assistance, Rent & TEFL teacher, Designer, multimedia, Low-Income Housing Insurance underwriter, In-Home Care & Respite Assistance, Catering manager., from The Following List Provided: ~ [place] [employer] Primary school teacher ~ Merchandiser, retail & Nutrition Program  ~ Armed forces logistics/support/administrative officer, Pharmacist, hospital in [place] ~ [employer] Sciences Corporation ~ Scientist, forensic ~ Engineer, drilling ~ TEFL teacher ~ Low Income Energy Assistance Program Application ~ [employer] System ~ Transportation Options ~ [employer] Social research officer, government ~ 2023 Medicaid Tips ~ OGE Energy Application ~ Community education officer in [place] ~ Low Income Housing in [place], [place] ~ FirstEnergy Corp ~ Section 8 Statistician ~ Family Caregivers ~ Home Care Services & Resources ~ Home Health Care Agencies ~ In-Home Care & Respite Agencies ~ Personal Care Services Application ~ Personal Care Services Agency Providers Encouraged Completion of Application for Medicaid & Submission to The [facility] Department of Social Services [phone]), for Processing. Encouraged Completion of Application for Personal Care  Services & Submission to [facility] [phone]), for Processing, if Approved for Medicaid, through The [facility] of Social Services [phone]). Encouraged Attendance at Follow-Up Appointment with Dr. Syliva Overman, Primary Care Provider with [facility] Primary Care [phone]), Scheduled on 12/18/2022 at 1:00 PM. Encouraged Contact with CSW (# [phone]), if You Have Questions, Need Assistance, or If Additional Social Work Needs Are Identified Between Now & Our Next Scheduled Follow-Up Outreach Call.      SDOH assessments and interventions completed:  Yes.  Care Coordination Interventions:  Yes, provided.   Follow up plan: Follow up call scheduled for 12/19/2022 at 11:15 am.   Encounter Outcome:  Pt. Visit Completed.   Danford Bad, BSW, MSW, LCSW  Licensed Restaurant manager, fast food [facility]  Mailing [place] [address] [place], [place] [address] Physical Address[address] [place], [place] [address] Toll Free Main # [phone] Fax # [phone] Cell # [phone] [email or web address]

## 2022-11-21 NOTE — Patient Instructions (Signed)
Visit Information  Thank you for taking time to visit with me today. Please don't hesitate to contact me if I can be of assistance to you.   Following are the goals we discussed today:   Goals Addressed               This Visit's Progress     Find Help in My Community. (pt-stated)   On track     Care Coordination Interventions:  Interventions Today    Flowsheet Row Most Recent Value  Chronic Disease   Chronic disease during today's visit Other  [Food Insecurity, Lack of Reliable Transportation, Financial Difficulties, Inadequate Housing, Recurrent Falls, Social Isolation, Limited Family Support]  General Interventions   General Interventions Discussed/Reviewed General Interventions Discussed, Health Screening, General Interventions Reviewed, Annual Eye Exam, Labs, Vaccines, Doctor Visits, [facility], Level of Care, Communication with, Horticulturist, commercial (DME)  [Encouraged]  Labs Kidney Function  [Encouraged]  Vaccines COVID-19, Flu, Pneumonia, RSV, Shingles, Tetanus/Pertussis/Diphtheria  [Encouraged]  Doctor Visits Discussed/Reviewed Doctor Visits Discussed, Specialist, Doctor Visits Reviewed, Annual Wellness Visits, PCP  [Encouraged]  Health Screening Bone Density, Colonoscopy, Mammogram  [Encouraged]  Durable Medical Equipment (DME) Walker, BP Cuff, Other  [Eye Glasses & Cane]  PCP/Specialist Visits Compliance with follow-up visit  [Encouraged]  Communication with PCP/Specialists, RN, Pharmacists  [Encouraged]  Level of Care Adult Daycare, Applications, Assisted Living, Personal Care Services  [Encouraged]  Applications Medicaid, Personal Care Services  [Encouraged]  Exercise Interventions   Exercise Discussed/Reviewed Exercise Discussed, Exercise Reviewed, Physical Activity, Weight Managment, Assistive device use and maintanence  [Encouraged]  Physical Activity Discussed/Reviewed Physical Activity Discussed, Home Exercise Program (HEP), Physical Activity Reviewed,  Types of exercise  [Encouraged]  Weight Management Weight loss  [Encouraged]  Education Interventions   Education Provided Provided Therapist, sports, Provided Web-based Education, Provided Education  [Encouraged]  Provided Engineer, petroleum On Nutrition, Mental Health/Coping with Illness, When to see the doctor, Eye Care, Labs, Applications, Exercise, Medication, Development worker, community, [facility], Other  [Encouraged]  Ship broker, Personal Care Services  [Encouraged]  Mental Health Interventions   Mental Health Discussed/Reviewed Mental Health Discussed, Depression, Mental Health Reviewed, Grief and Loss, Substance Abuse, Suicide, Other, Coping Strategies, Crisis, Anxiety  [Domestic Violence]  Nutrition Interventions   Nutrition Discussed/Reviewed Nutrition Discussed, Adding fruits and vegetables, Nutrition Reviewed, Fluid intake, Portion sizes, Carbohydrate meal planning, Decreasing sugar intake, Increasing proteins, Decreasing fats, Decreasing salt  [Encouraged]  Pharmacy Interventions   Pharmacy Dicussed/Reviewed Pharmacy Topics Discussed, Medication Adherence, Pharmacy Topics Reviewed, Medications and their functions, Affording Medications  [Encouraged]  Safety Interventions   Safety Discussed/Reviewed Safety Discussed, Safety Reviewed, Fall Risk, Home Safety  [Encouraged]  Home Safety Assistive Devices, Need for home safety assessment, Refer for home visit, Refer for community resources  [Encouraged]  Advanced Directive Interventions   Advanced Directives Discussed/Reviewed Advanced Directives Discussed  [Encouraged]       Active Listening & Reflection Utilized.  Verbalization of Feelings Encouraged.  Emotional Support Provided. Diminished Symptoms of Anxiety & Stress Acknowledged. Problem Solving Interventions Employed. Task-Centered Solutions Implemented.   Solution-Focused Strategies Activated. Acceptance & Commitment Therapy Performed. Brief Cognitive Behavioral  Therapy Initiated. Client-Centered Therapy Indicated. Encouraged Implementation of Deep Breathing Exercises, Relaxation Techniques, & Mindfulness Meditation Strategies Daily. Encouraged Administration of Medications, Exactly as Prescribed. Encouraged Increased Level of Activity & Exercise, as Tolerated. Encouraged Solicitor with [employer], Nurse, adult, Field seismologist of Interest in [place], In An Effort to [employer] & Nutritional Assistance, Rent & TEFL teacher, Designer, multimedia,  Low-Income Housing Insurance underwriter, In-Home Care & Respite Assistance, Etc., from The Following List Provided: ~ [place] [employer] Primary school teacher ~ [employer] Support & Nutrition Program  ~ Armed forces logistics/support/administrative officer, Stage manager & Soup Kitchens in [place] ~ [employer] Corporation ~ Scientist, forensic ~ Engineer, drilling ~ TEFL teacher ~ Low Income Energy Assistance Program Application ~ American Financial Health Financial Assistance Application ~ Transportation Options ~ [employer] Social research officer, government ~ 2023 Medicaid Tips ~ OGE Energy Application ~ Community education officer in [place] ~ Low Income Housing in [place], [place] ~ FirstEnergy Corp ~ Section 8 Statistician ~ Family Caregivers ~ Home Care Services & Resources ~ Home Health Care Agencies ~ In-Home Care & Respite Agencies ~ Personal Care Services Application ~ Personal Care Services Agency Providers Encouraged Completion of Application for Medicaid & Submission to The [facility] Department of Social Services [phone]), for Processing. Encouraged Completion of Application for Personal Care Services & Submission to [facility] [phone]), for Processing, if Approved for Medicaid, through The [facility] of Social Services [phone]). Encouraged Attendance at Follow-Up Appointment with Dr. Syliva Overman, Primary Care Provider with [facility] Primary [facility] [phone]), Scheduled on 12/18/2022 at 1:00 PM. Encouraged Contact with CSW (# [phone]), if You Have Questions, Need Assistance, or If Additional Social Work Needs Are Identified Between Now & Our Next Scheduled Follow-Up Outreach Call.      Our next appointment is by telephone on 12/19/2022 at 11:15 am.  Please call the care guide team at [phone] if you need to cancel or reschedule your appointment.   If you are experiencing a Mental Health or Behavioral Health Crisis or need someone to talk to, please call the Suicide and Crisis Lifeline: 988 call the [place] National Suicide Prevention Lifeline: 518-622-5585 or TTY: (279)461-6956 TTY 757-304-0409) to talk to a trained counselor call 1-800-273-TALK (toll free, 24 hour hotline) go to [facility] [address], [place] [phone]) call the [facility] Crisis Line: [phone] call 911  Patient verbalizes understanding of instructions and care plan provided today and agrees to view in MyChart. Active MyChart status and patient understanding of how to access instructions and care plan via MyChart confirmed with patient.     Telephone follow up appointment with care management team member scheduled for:  12/19/2022 at 11:15 am.  Danford Bad, BSW, MSW, LCSW  Licensed Clinical Social Worker  [employer] Corporate treasurer [facility]  Mailing [place]. [address], [place], [place] [address] Physical Address[address] [place], [place] [address] Toll Free Main # [phone] Fax # [phone] Cell # [phone] [email or web address]

## 2022-11-28 ENCOUNTER — Other Ambulatory Visit: Payer: Medicare HMO | Admitting: *Deleted

## 2022-11-28 ENCOUNTER — Telehealth: Payer: Medicare PPO

## 2022-11-28 ENCOUNTER — Telehealth: Payer: Self-pay | Admitting: *Deleted

## 2022-11-28 NOTE — Patient Outreach (Signed)
   RN Visit Note    Name: MAYSAM MCGAULEY MRN: [id number]      DOB: 1945-09-25  Subjective: ANUJA GREESON is a 77 y.o. year old female who is a primary care patient of Syliva Overman MD.     An unsuccessful telephone outreach was made today, unable to leave message, voicemail full.  Plan:Telephone follow up appointment with care management team member scheduled for:  upon care guide rescheduling.  Irving Shows [facility], BSN [place]/ Ambulatory Care Management [phone]

## 2022-12-03 ENCOUNTER — Telehealth: Payer: Self-pay

## 2022-12-03 NOTE — Progress Notes (Signed)
  Care Coordination Note  12/02/2022 Name: Angela Chase MRN: [id number] DOB: 23-Sep-1945  Angela Chase is a 77 y.o. year old female who is a primary care patient of Kerri Perches, MD and is actively engaged with the Chronic Care Management team. I reached out to [name] by phone today to assist with re-scheduling a follow up visit with the RN Case Manager  Follow up plan: Unsuccessful telephone outreach attempt made. A HIPAA compliant phone message was left for the patient providing contact information and requesting a return call.  The care management team will reach out to the patient again over the next 7 days.  If patient returns call to provider office, please advise to call CCM Care Guide Penne Lash  at [phone]  Penne Lash, RMA Care Guide [facility]  [place], [place] [address] Direct Dial: [phone] [email or web address]

## 2022-12-03 NOTE — Progress Notes (Signed)
  Care Coordination Note  12/03/2022 Name: Angela Chase MRN: [id number] DOB: 11-24-1945  Angela Chase is a 77 y.o. year old female who is a primary care patient of Kerri Perches, MD and is actively engaged with the Chronic Care Management team. I reached out to [name] by phone today to assist with re-scheduling a follow up visit with the RN Case Manager  Follow up plan: We have been unable to make contact with the patient for follow up. The care management team is available to follow up with the patient after provider conversation with the patient regarding recommendation for care management engagement and subsequent re-referral to the care management team.   Penne Lash, RMA Care Guide [facility]  [place], [place] [address] Direct Dial: [phone] [email or web address]

## 2022-12-13 ENCOUNTER — Telehealth: Payer: Self-pay

## 2022-12-13 NOTE — Progress Notes (Signed)
  Care Coordination Note  12/13/2022 Name: Angela Chase MRN: [id number] DOB: 30-Sep-1945  Angela Chase is a 77 y.o. year old female who is a primary care patient of Kerri Perches, MD and is actively engaged with the Chronic Care Management team. I reached out to [name] by phone today to assist with re-scheduling a follow up visit with the RN Case Manager  Follow up plan: Unable to make contact on outreach attempts x 3. PCP Kerri Perches, MD notified via routed documentation in medical record.   Penne Lash, RMA Care Guide [facility]  [place], [place] [address] Direct Dial: [phone] [email or web address]

## 2022-12-18 ENCOUNTER — Ambulatory Visit: Payer: Medicare PPO | Admitting: Family Medicine

## 2022-12-19 ENCOUNTER — Ambulatory Visit: Payer: Self-pay | Admitting: *Deleted

## 2022-12-19 ENCOUNTER — Encounter: Payer: Self-pay | Admitting: *Deleted

## 2022-12-19 NOTE — Patient Instructions (Signed)
Visit Information  Thank you for taking time to visit with me today. Please don't hesitate to contact me if I can be of assistance to you.   Following are the goals we discussed today:   Goals Addressed               This Visit's Progress     Find Help in My Community. (pt-stated)   On track     Care Coordination Interventions:  Interventions Today    Flowsheet Row Most Recent Value  Chronic Disease   Chronic disease during today's visit Other  [Food Insecurity, Lack of Reliable Transportation, Financial Difficulties, Inadequate Housing, Recurrent Falls, Social Isolation, Limited Family Support]  General Interventions   General Interventions Discussed/Reviewed General Interventions Discussed, Health Screening, General Interventions Reviewed, Annual Eye Exam, Labs, Vaccines, Doctor Visits, [employer], Level of Care, Communication with, Horticulturist, commercial (DME)  [Encouraged]  Labs Kidney Function  [Encouraged]  Vaccines COVID-19, Flu, Pneumonia, RSV, Shingles, Tetanus/Pertussis/Diphtheria  [Encouraged]  Doctor Visits Discussed/Reviewed Doctor Visits Discussed, Specialist, Doctor Visits Reviewed, Annual Wellness Visits, PCP  [Encouraged]  Health Screening Bone Density, Colonoscopy, Mammogram  [Encouraged]  Durable Medical Equipment (DME) Walker, BP Cuff, Other  [Eye Glasses & Cane]  PCP/Specialist Visits Compliance with follow-up visit  [Encouraged]  Communication with PCP/Specialists, RN, Pharmacists  [Encouraged]  Level of Care Adult Daycare, Applications, Assisted Living, Personal Care Services  [Encouraged]  Applications Medicaid, Personal Care Services  [Encouraged]  Exercise Interventions   Exercise Discussed/Reviewed Exercise Discussed, Exercise Reviewed, Physical Activity, Weight Managment, Assistive device use and maintanence  [Encouraged]  Physical Activity Discussed/Reviewed Physical Activity Discussed, Home Exercise Program (HEP), Physical Activity Reviewed,  Types of exercise  [Encouraged]  Weight Management Weight loss  [Encouraged]  Education Interventions   Education Provided Provided Therapist, sports, Provided Web-based Education, Provided Education  [Encouraged]  Provided Engineer, petroleum On Nutrition, Mental Health/Coping with Illness, When to see the doctor, Eye Care, Labs, Applications, Exercise, Medication, Development worker, community, [employer], Other  [Encouraged]  Ship broker, Personal Care Services  [Encouraged]  Mental Health Interventions   Mental Health Discussed/Reviewed Mental Health Discussed, Depression, Mental Health Reviewed, Grief and Loss, Substance Abuse, Suicide, Other, Coping Strategies, Crisis, Anxiety  [Domestic Violence]  Nutrition Interventions   Nutrition Discussed/Reviewed Nutrition Discussed, Adding fruits and vegetables, Nutrition Reviewed, Fluid intake, Portion sizes, Carbohydrate meal planning, Decreasing sugar intake, Increasing proteins, Decreasing fats, Decreasing salt  [Encouraged]  Pharmacy Interventions   Pharmacy Dicussed/Reviewed Pharmacy Topics Discussed, Medication Adherence, Pharmacy Topics Reviewed, Medications and their functions, Affording Medications  [Encouraged]  Safety Interventions   Safety Discussed/Reviewed Safety Discussed, Safety Reviewed, Fall Risk, Home Safety  [Encouraged]  Home Safety Assistive Devices, Need for home safety assessment, Refer for home visit, Refer for community resources  [Encouraged]  Advanced Directive Interventions   Advanced Directives Discussed/Reviewed Advanced Directives Discussed  [Encouraged]       Active Listening & Reflection Utilized.  Verbalization of Feelings Encouraged.  Emotional Support Provided. Symptoms of Anxiety & Stress Acknowledged. Feelings of Loneliness & Isolation Validated. Problem Solving Interventions Employed. Task-Centered Solutions Implemented.   Solution-Focused Strategies Activated. Acceptance & Commitment Therapy  Performed. Cognitive Behavioral Therapy Initiated. Encouraged Implementation of Deep Breathing Exercises, Relaxation Techniques, & Mindfulness Meditation Strategies Daily. Encouraged Administration of Medications, Exactly as Prescribed. Encouraged Increased Level of Activity & Exercise, as Tolerated. Encouraged Self-Enrollment with Psychiatrist of Interest in [facility], from List Provided, to Receive Psychotropic Medication Administration & Management, in An Effort to Reduce & Manage Symptoms of Anxiety.  Encouraged Self-Enrollment with Therapist of Interest in [facility], from List Provided, to Receive Psychotherapeutic Counseling & Supportive Services, in An Effort to Reduce & Manage Symptoms of Anxiety. Encouraged Solicitor with [employer], Nurse, adult, Field seismologist of Interest in [place], from List Provided, In An Effort to [employer] & Nutritional Assistance, Rent & TEFL teacher, Designer, multimedia, Low-Income Housing Insurance underwriter, & In-Home Care & Respite Assistance. Encouraged to Apply for Medicaid, Offering Assistance with Application Completion & Submission to [facility] [phone]), for Processing. Encouraged to Apply for Personal Care Services, Offering Assistance with Application Completion & Submission to [facility] [phone]) for Processing, if Approved for Medicaid, through [facility] [phone]). Encouraged Contact with CSW (# [phone]), if You Have Questions, Need Assistance, or If Additional Social Work Needs Are Identified Between Now & Our Next Follow-Up Outreach Call, Scheduled on 01/06/2023 at 2:30 PM.      Our next appointment is by telephone on 01/06/2023 at 2:30 pm.  Please call the care guide team at [phone] if you need to cancel or reschedule your appointment.   If you are experiencing a Mental  Health or Behavioral Health Crisis or need someone to talk to, please call the Suicide and Crisis Lifeline: 988 call the [place] National Suicide Prevention Lifeline: 479-076-6586 or TTY: 701-816-6799 TTY 604-652-4109) to talk to a trained counselor call 1-800-273-TALK (toll free, 24 hour hotline) go to [facility] [address] [place] [phone]) call the [facility] Crisis Line: [phone] call 911  Patient verbalizes understanding of instructions and care plan provided today and agrees to view in MyChart. Active MyChart status and patient understanding of how to access instructions and care plan via MyChart confirmed with patient.     Telephone follow up appointment with care management team member scheduled for:   01/06/2023 at 2:30 pm.  Danford Bad, BSW, MSW, LCSW  Embedded Practice Social Work Case Manager  [facility], Population Health Direct Dial: [phone]  Fax: [phone] Email: [email or web address]: [email or web address]

## 2022-12-19 NOTE — Patient Outreach (Signed)
Care Coordination   Follow Up Visit Note   12/19/2022  Name: Angela Chase MRN: [id number] DOB: 07/09/1945  Angela Chase is a 77 y.o. year old female who sees Angela Perches, MD for primary care. I spoke with Angela Chase by phone today.  What matters to Angela patients health and wellness today?  Find Help in My Community.   Goals Addressed               This Visit's Progress     Find Help in My Community. (pt-stated)   On track     Care Coordination Interventions:  Interventions Today    Flowsheet Row Most Recent Value  Chronic Disease   Chronic disease during today's visit Other  [Food Insecurity, Lack of Reliable Transportation, Financial Difficulties, Inadequate Housing, Recurrent Falls, Social Isolation, Limited Family Support]  General Interventions   General Interventions Discussed/Reviewed General Interventions Discussed, Health Screening, General Interventions Reviewed, Annual Eye Exam, Labs, Vaccines, Doctor Visits, Angela Chase, Level of Care, Communication with, Horticulturist, commercial (DME)  [Encouraged]  Labs Kidney Function  [Encouraged]  Vaccines COVID-19, Flu, Pneumonia, RSV, Shingles, Tetanus/Pertussis/Diphtheria  [Encouraged]  Doctor Visits Discussed/Reviewed Doctor Visits Discussed, Specialist, Doctor Visits Reviewed, Annual Wellness Visits, PCP  [Encouraged]  Health Screening Bone Density, Colonoscopy, Mammogram  [Encouraged]  Durable Medical Equipment (DME) Walker, BP Cuff, Other  [Eye Glasses & Cane]  PCP/Specialist Visits Compliance with follow-up visit  [Encouraged]  Communication with PCP/Specialists, RN, Pharmacists  [Encouraged]  Level of Care Adult Daycare, Applications, Assisted Living, Personal Care Chase  [Encouraged]  Applications Medicaid, Personal Care Chase  [Encouraged]  Exercise Interventions   Exercise Discussed/Reviewed Exercise Discussed, Exercise Reviewed, Physical Activity, Weight Managment,  Assistive device use and maintanence  [Encouraged]  Physical Activity Discussed/Reviewed Physical Activity Discussed, Home Exercise Program (HEP), Physical Activity Reviewed, Types of exercise  [Encouraged]  Weight Management Weight loss  [Encouraged]  Education Interventions   Education Provided Provided Therapist, sports, Provided Web-based Education, Provided Education  [Encouraged]  Provided Engineer, petroleum On Nutrition, Mental Health/Coping with Illness, When to see Angela doctor, Eye Care, Labs, Applications, Exercise, Medication, Development worker, community, Angela Chase, Other  [Encouraged]  Ship broker, Personal Care Chase  [Encouraged]  Mental Health Interventions   Mental Health Discussed/Reviewed Mental Health Discussed, Depression, Mental Health Reviewed, Grief and Loss, Substance Abuse, Suicide, Other, Coping Strategies, Crisis, Anxiety  [Domestic Violence]  Nutrition Interventions   Nutrition Discussed/Reviewed Nutrition Discussed, Adding fruits and vegetables, Nutrition Reviewed, Fluid intake, Portion sizes, Carbohydrate meal planning, Decreasing sugar intake, Increasing proteins, Decreasing fats, Decreasing salt  [Encouraged]  Pharmacy Interventions   Pharmacy Dicussed/Reviewed Pharmacy Topics Discussed, Medication Adherence, Pharmacy Topics Reviewed, Medications and their functions, Affording Medications  [Encouraged]  Safety Interventions   Safety Discussed/Reviewed Safety Discussed, Safety Reviewed, Fall Risk, Home Safety  [Encouraged]  Home Safety Assistive Devices, Need for home safety assessment, Refer for home visit, Refer for community resources  [Encouraged]  Advanced Directive Interventions   Advanced Directives Discussed/Reviewed Advanced Directives Discussed  [Encouraged]       Active Listening & Reflection Utilized.  Verbalization of Feelings Encouraged.  Emotional Support Provided. Symptoms of Anxiety & Stress Acknowledged. Feelings of Loneliness &  Isolation Validated. Problem Solving Interventions Employed. Task-Centered Solutions Implemented.   Solution-Focused Strategies Activated. Acceptance & Commitment Therapy Performed. Cognitive Behavioral Therapy Initiated. Encouraged Implementation of Deep Breathing Exercises, Relaxation Techniques, & Mindfulness Meditation Strategies Daily. Encouraged Administration of Medications, Exactly as Prescribed. Encouraged Increased Level of Activity & Exercise, as Tolerated.  Encouraged Self-Enrollment with Psychiatrist of Interest in Angela Chase, from List Provided, to Receive Psychotropic Medication Administration & Management, in An Effort to Reduce & Manage Symptoms of Anxiety. Encouraged Self-Enrollment with Therapist of Interest in Angela Chase, from List Provided, to Receive Psychotherapeutic Counseling & Supportive Chase, in An Effort to Reduce & Manage Symptoms of Anxiety. Encouraged Solicitor with Angela Chase, Nurse, adult, Field seismologist of Interest in [place], from List Provided, In An Effort to [employer] & Nutritional Assistance, Rent & TEFL teacher, Designer, multimedia, Low-Income Housing Insurance underwriter, & In-Home Care & Respite Assistance. Encouraged to Apply for Medicaid, Offering Assistance with Application Completion & Submission to Angela Chase [phone]), for Processing. Encouraged to Apply for Personal Care Chase, Offering Assistance with Application Completion & Submission to Angela Chase [phone]) for Processing, if Approved for Medicaid, through Angela Chase [phone]). Encouraged Contact with CSW (# [phone]), if You Have Questions, Need Assistance, or If Additional Social Work Needs Are Identified Between Now & Our Next Follow-Up Outreach Call, Scheduled on 01/06/2023 at 2:30 PM.      SDOH assessments and  interventions completed:  Yes.  Care Coordination Interventions:  Yes, provided.   Follow up plan: Follow up call scheduled for 01/06/2023 at 2:30 pm.  Encounter Outcome:  Pt. Visit Completed.   Danford Bad, BSW, MSW, Printmaker Social Work Case Set designer Health  [facility], Population Health Direct Dial: [phone]  Fax: [phone] Email: [email or web address] Website: [email or web address]

## 2022-12-20 ENCOUNTER — Encounter: Payer: Self-pay | Admitting: Family Medicine

## 2023-01-06 ENCOUNTER — Ambulatory Visit: Payer: Self-pay | Admitting: *Deleted

## 2023-01-06 NOTE — Patient Outreach (Signed)
  Care Coordination   01/06/2023  Name: Angela Chase MRN: [id number] DOB: 1945/08/25   Care Coordination Outreach Attempts:  An unsuccessful telephone outreach was attempted today to offer the patient information about available care coordination services. Unable to leave HIPAA compliant message on voicemail.  Follow Up Plan:  Additional outreach attempts will be made to offer the patient care coordination information and services.   Encounter Outcome:  No Answer.   Care Coordination Interventions:  No, not indicated.    Danford Bad, BSW, MSW, Printmaker Social Work Case Set designer Health  [facility], Population Health Direct Dial: [phone]  Fax: [phone] Email: [email or web address]: [email or web address]

## 2023-01-07 ENCOUNTER — Telehealth: Payer: Self-pay | Admitting: *Deleted

## 2023-01-07 NOTE — Progress Notes (Signed)
  Care Coordination Note  01/07/2023 Name: Angela Chase MRN: [id number] DOB: May 03, 1945  Angela Chase is a 77 y.o. year old female who is a primary care patient of Kerri Perches, MD and is actively engaged with the care management team. I reached out to [name] by phone today to assist with re-scheduling a follow up visit with the Licensed Clinical Social Worker  Follow up plan: Unsuccessful telephone outreach attempt made.    [facility]  Care Coordination Care Guide  Direct Dial: [phone]

## 2023-01-13 NOTE — Progress Notes (Signed)
Care Coordination Note  01/13/2023 Name: Angela Chase MRN: [id number] DOB: 25-Jun-1945  [name] is a 77 y.o. year old female who is a primary care patient of Kerri Perches, MD and is actively engaged with the care management team. I reached out to [name] by phone today to assist with re-scheduling a follow up visit with the Licensed Clinical Social Worker  Follow up plan: Unsuccessful telephone outreach attempt made. A HIPAA compliant phone message was left for the patient providing contact information and requesting a return call.  We have been unable to make contact with the patient for follow up. The care management team is available to follow up with the patient after provider conversation with the patient regarding recommendation for care management engagement and subsequent re-referral to the care management team.   [facility] Coordination Care Guide  Direct Dial: [phone]

## 2023-01-31 ENCOUNTER — Other Ambulatory Visit: Payer: Self-pay | Admitting: Family Medicine

## 2023-01-31 DIAGNOSIS — Z1211 Encounter for screening for malignant neoplasm of colon: Secondary | ICD-10-CM

## 2023-01-31 DIAGNOSIS — Z1212 Encounter for screening for malignant neoplasm of rectum: Secondary | ICD-10-CM

## 2023-02-27 ENCOUNTER — Encounter (INDEPENDENT_AMBULATORY_CARE_PROVIDER_SITE_OTHER): Payer: Self-pay | Admitting: *Deleted

## 2023-02-28 ENCOUNTER — Telehealth: Payer: Self-pay | Admitting: *Deleted

## 2023-02-28 NOTE — Progress Notes (Signed)
  Care Coordination Note  02/28/2023 Name: [name] MRN: [id number] DOB: [birth year]  Angela Chase is a 76 y.o. year old female who is a primary care patient of Kerri Perches, MD and is actively engaged with the care management team. I reached out to [name] by phone today to assist with re-scheduling a follow up visit with the RN Case Manager  Follow up plan: Unsuccessful telephone outreach attempt made. A HIPAA compliant phone message was left for the patient providing contact information and requesting a return call.   [facility]  Care Coordination Care Guide  Direct Dial: [phone]

## 2023-03-07 NOTE — Progress Notes (Signed)
  Care Coordination Note  03/07/2023 Name: Angela Chase MRN: [id number] DOB: [birth year]  Angela Chase is a 77 y.o. year old female who is a primary care patient of Kerri Perches, MD and is actively engaged with the care management team. I reached out to [name] by phone today to assist with re-scheduling a follow up visit with the RN Case Manager and Licensed Clinical Social Worker  Follow up plan: Unsuccessful telephone outreach attempt made. We have been unable to make contact with the patient for follow up. The care management team is available to follow up with the patient after provider conversation with the patient regarding recommendation for care management engagement and subsequent re-referral to the care management team.   [facility] Coordination Care Guide  Direct Dial: [phone]

## 2023-04-04 LAB — COLOGUARD

## 2024-04-14 ENCOUNTER — Encounter (HOSPITAL_COMMUNITY): Payer: Self-pay | Admitting: *Deleted

## 2024-04-14 ENCOUNTER — Emergency Department (HOSPITAL_COMMUNITY)
Admission: EM | Admit: 2024-04-14 | Source: Home / Self Care | Attending: Emergency Medicine | Admitting: Emergency Medicine

## 2024-04-14 ENCOUNTER — Other Ambulatory Visit: Payer: Self-pay

## 2024-04-14 ENCOUNTER — Emergency Department (HOSPITAL_COMMUNITY)

## 2024-04-14 ENCOUNTER — Ambulatory Visit: Payer: Self-pay

## 2024-04-14 ENCOUNTER — Ambulatory Visit: Payer: Self-pay | Admitting: Nurse Practitioner

## 2024-04-14 DIAGNOSIS — M1710 Unilateral primary osteoarthritis, unspecified knee: Secondary | ICD-10-CM

## 2024-04-14 DIAGNOSIS — M1711 Unilateral primary osteoarthritis, right knee: Secondary | ICD-10-CM | POA: Diagnosis not present

## 2024-04-14 DIAGNOSIS — M25511 Pain in right shoulder: Secondary | ICD-10-CM | POA: Insufficient documentation

## 2024-04-14 DIAGNOSIS — J45909 Unspecified asthma, uncomplicated: Secondary | ICD-10-CM | POA: Insufficient documentation

## 2024-04-14 DIAGNOSIS — M1712 Unilateral primary osteoarthritis, left knee: Secondary | ICD-10-CM | POA: Diagnosis not present

## 2024-04-14 DIAGNOSIS — Z7951 Long term (current) use of inhaled steroids: Secondary | ICD-10-CM | POA: Insufficient documentation

## 2024-04-14 DIAGNOSIS — M25561 Pain in right knee: Secondary | ICD-10-CM | POA: Diagnosis present

## 2024-04-14 LAB — CBC WITH DIFFERENTIAL/PLATELET
Abs Immature Granulocytes: 0.03 K/uL (ref 0.00–0.07)
Basophils Absolute: 0 K/uL (ref 0.0–0.1)
Basophils Relative: 0 %
Eosinophils Absolute: 0.2 K/uL (ref 0.0–0.5)
Eosinophils Relative: 3 %
HCT: 33.2 % — ABNORMAL LOW (ref 36.0–46.0)
Hemoglobin: 11 g/dL — ABNORMAL LOW (ref 12.0–15.0)
Immature Granulocytes: 1 %
Lymphocytes Relative: 12 %
Lymphs Abs: 0.7 K/uL (ref 0.7–4.0)
MCH: 25.8 pg — ABNORMAL LOW (ref 26.0–34.0)
MCHC: 33.1 g/dL (ref 30.0–36.0)
MCV: 77.9 fL — ABNORMAL LOW (ref 80.0–100.0)
Monocytes Absolute: 0.5 K/uL (ref 0.1–1.0)
Monocytes Relative: 9 %
Neutro Abs: 4.4 K/uL (ref 1.7–7.7)
Neutrophils Relative %: 75 %
Platelets: 215 K/uL (ref 150–400)
RBC: 4.26 MIL/uL (ref 3.87–5.11)
RDW: 14.6 % (ref 11.5–15.5)
WBC: 5.8 K/uL (ref 4.0–10.5)
nRBC: 0 % (ref 0.0–0.2)

## 2024-04-14 LAB — BASIC METABOLIC PANEL WITH GFR
Anion gap: 12 (ref 5–15)
BUN: 9 mg/dL (ref 8–23)
CO2: 23 mmol/L (ref 22–32)
Calcium: 9 mg/dL (ref 8.9–10.3)
Chloride: 106 mmol/L (ref 98–111)
Creatinine, Ser: 0.67 mg/dL (ref 0.44–1.00)
GFR, Estimated: >60 mL/min (ref >60)
Glucose, Bld: 90 mg/dL (ref 70–99)
Potassium: 3.6 mmol/L (ref 3.5–5.1)
Sodium: 141 mmol/L (ref 135–145)

## 2024-04-14 MED ORDER — ONDANSETRON 4 MG PO TBDP
4.0000 mg | ORAL_TABLET | Freq: Once | ORAL | Status: AC
  Administered 2024-04-14: 19:00:00 4 mg via ORAL
  Filled 2024-04-14: qty 1

## 2024-04-14 MED ORDER — KETOROLAC TROMETHAMINE 30 MG/ML IJ SOLN
30.0000 mg | Freq: Once | INTRAMUSCULAR | Status: AC
  Administered 2024-04-14: 16:00:00 30 mg via INTRAMUSCULAR
  Filled 2024-04-14: qty 1

## 2024-04-14 MED ORDER — MORPHINE SULFATE (PF) 4 MG/ML IV SOLN
4.0000 mg | Freq: Once | INTRAVENOUS | Status: AC
  Administered 2024-04-14: 19:00:00 4 mg via INTRAMUSCULAR
  Filled 2024-04-14: qty 1
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
## 2024-04-14 NOTE — ED Notes (Signed)
 NT attempted to ambulate Pt w/ a walker.  Pt was unable to even stand. NT worked w/ Pt for around .

## 2024-04-14 NOTE — ED Notes (Signed)
 CSW notes TOC consult for housing resources due to pt living in [place]. CSW reached out to MD and PA, due to some generalized weakness PT eval is going to be ordered. CSW to await PT recommendations to know disposition plans. TOC to follow.

## 2024-04-14 NOTE — ED Notes (Addendum)
 Attempted to amb pt, pt had trouble standing from bed using walker, pt endorsed pain in bilateral knees; pt not able to ambulate; RN notified

## 2024-04-14 NOTE — ED Triage Notes (Signed)
 Pt BIB [facility] from [facility] for c/o bilateral knee pain and right shoulder pain  Pt states she has had impaired mobility for the last few months
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
## 2024-04-14 NOTE — ED Notes (Signed)
 Secure chat sent to PT for evaluation request.  Awaiting response.

## 2024-04-14 NOTE — ED Notes (Signed)
 Attempted to ambulate pt. Pt not able to stand or bear weight on either leg. Pt states that she has not been able to walk in months. PA Idol made aware of attempt.

## 2024-04-14 NOTE — Telephone Encounter (Signed)
Noted patient scheduled

## 2024-04-15 MED ORDER — DIPHENHYDRAMINE HCL 25 MG PO CAPS
25.0000 mg | ORAL_CAPSULE | Freq: Once | ORAL | Status: AC
  Administered 2024-04-15: 01:00:00 25 mg via ORAL
  Filled 2024-04-15: qty 1

## 2024-04-15 MED ORDER — DIPHENHYDRAMINE HCL 25 MG PO CAPS
25.0000 mg | ORAL_CAPSULE | Freq: Three times a day (TID) | ORAL | Status: DC | PRN
  Administered 2024-04-15: 21:00:00 25 mg via ORAL
  Filled 2024-04-15: qty 1

## 2024-04-15 NOTE — ED Provider Notes (Signed)
 Emergency Medicine Observation Re-evaluation Note  Angela Chase is a 78 y.o. female, seen on rounds today.  Pt initially presented to the ED for complaints of Knee Pain Currently, the patient is awaiting physical therapy and possibly rehab facility.  Physical Exam  BP (!) 108/53   Pulse 85   Temp 98.4 F (36.9 C) (Oral)   Resp 18   Ht 5' 5.5 (1.664 m)   Wt 129.7 kg   SpO2 94%   BMI 46.87 kg/m  Physical Exam Alert and in no acute distress  ED Course / MDM  EKG:   I have reviewed the labs performed to date as well as medications administered while in observation.  Recent changes in the last 24 hours include none.  Plan  Current plan is for rehab facility.    Suzette Pac, MD 04/15/24 [phone]

## 2024-04-15 NOTE — Plan of Care (Signed)
°  Problem: Acute Rehab PT Goals(only PT should resolve) Goal: Pt Will Go Supine/Side To Sit Outcome: Progressing Flowsheets (Taken 04/15/2024 1214) Pt will go Supine/Side to Sit: with minimal assist Goal: Patient Will Transfer Sit To/From Stand Outcome: Progressing Flowsheets (Taken 04/15/2024 1214) Patient will transfer sit to/from stand: with moderate assist Goal: Pt Will Transfer Bed To Chair/Chair To Bed Outcome: Progressing Flowsheets (Taken 04/15/2024 1214) Pt will Transfer Bed to Chair/Chair to Bed: with mod assist   Problem: Acute Rehab PT Goals(only PT should resolve) Goal: Pt Will Ambulate Flowsheets (Taken 04/15/2024 1214) Pt will Ambulate:  10 feet  with rolling walker  with moderate assist    12:15 PM, 04/15/2024 Rosaria Settler, PT, DPT [place] with [facility]

## 2024-04-15 NOTE — ED Notes (Signed)
 PT at [facility].
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
## 2024-04-15 NOTE — NC FL2 (Signed)
 [place]  MEDICAID FL2 LEVEL OF CARE FORM     IDENTIFICATION  Patient Name: Angela Chase Birthdate: 16-Mar-1946 Sex: female Admission Date (Current Location): 04/14/2024  [facility] and [place] Number:  [employer] and Address:  [facility],  [address], [place] [address]      Provider Number: [id number]  Attending Physician Name and Address:  Suzette Pac, MD  Relative Name and Phone Number:       Current Level of Care: Hospital Recommended Level of Care: Skilled Nursing Facility Prior Approval Number:    Date Approved/Denied:   PASRR Number: [id number]  Discharge Plan: SNF    Current Diagnoses: Patient Active Problem List   Diagnosis Date Noted   Major depression, recurrent, chronic 10/23/2022   Elevated TSH 10/08/2022   Left hand weakness 08/28/2022   Housing or economic circumstance 08/28/2022   Assistance needed with transportation 08/28/2022   Encounter for annual general medical examination with abnormal findings in adult 03/08/2020   Left hip pain 03/08/2020   Positive colorectal cancer screening using Cologuard test 08/05/2018   Vision loss, bilateral 01/06/2018   Social isolation 10/04/2017   Pruritus 11/09/2015   Seasonal allergies 08/30/2015   Recurrent falls while walking 02/01/2015   Urinary incontinence 04/01/2014   Vitamin D  deficiency 04/01/2014   Asthma 10/11/2013   Vitamin D  deficiency 06/18/2013   Osteoarthritis of both knees 06/18/2013   Morbid obesity with BMI of 40.0-44.9, adult (HCC) 06/18/2013   Insomnia 06/18/2013   Shoulder pain, right 06/17/2013   OSA (obstructive sleep apnea) 09/26/2011   Hyperlipemia 05/25/2007    Orientation RESPIRATION BLADDER Height & Weight     Self, Time, Situation, Place  Normal Continent Weight: 286 lb (129.7 kg) Height:  5' 5.5 (166.4 cm)  BEHAVIORAL SYMPTOMS/MOOD NEUROLOGICAL BOWEL NUTRITION STATUS      Continent Diet (regular)  AMBULATORY STATUS COMMUNICATION  OF NEEDS Skin   Extensive Assist Verbally Normal                       Personal Care Assistance Level of Assistance  Bathing, Feeding, Dressing Bathing Assistance: Maximum assistance Feeding assistance: Independent Dressing Assistance: Maximum assistance     Functional Limitations Info  Sight, Hearing, Speech Sight Info: Impaired Hearing Info: Adequate Speech Info: Adequate    SPECIAL CARE FACTORS FREQUENCY  PT (By licensed PT), OT (By licensed OT)     PT Frequency: 5 times weekly OT Frequency: 5 times weekly            Contractures Contractures Info: Not present    Additional Factors Info  Code Status, Allergies Code Status Info: FULL Allergies Info: Cephalexin, Omeprazole , Rofecoxib           Current Medications (04/15/2024):  This is the current hospital active medication list No current facility-administered medications for this encounter.   Current Outpatient Medications  Medication Sig Dispense Refill   acetaminophen  (TYLENOL ) 650 MG CR tablet Take 1,300 mg by mouth every 8 (eight) hours as needed for pain.     albuterol  (PROVENTIL ) (2.5 MG/3ML) 0.083% nebulizer solution Take 3 mLs (2.5 mg total) by nebulization every 6 (six) hours as needed for wheezing or shortness of breath. (Patient not taking: Reported on 04/15/2024) 150 mL 2   albuterol  (PROVENTIL ) (2.5 MG/3ML) 0.083% nebulizer solution Take 3 mLs (2.5 mg total) by nebulization every 6 (six) hours as needed for wheezing or shortness of breath. (Patient not taking: Reported on 04/15/2024) 150 mL 1  albuterol  (VENTOLIN  HFA) 108 (90 Base) MCG/ACT inhaler Inhale 2 puffs into the lungs every 6 (six) hours as needed for wheezing or shortness of breath. (Patient not taking: Reported on 04/15/2024) 18 g 5   Fluticasone -Salmeterol (ADVAIR) 100-50 MCG/DOSE AEPB Inhale 1 puff into the lungs 2 (two) times daily. (Patient not taking: Reported on 04/15/2024) 1 each 3   furosemide  (LASIX ) 40 MG tablet Take 1 tablet  (40 mg total) by mouth daily. prn (Patient not taking: Reported on 04/15/2024) 30 tablet 1   potassium chloride  SA (KLOR-CON  M) 20 MEQ tablet Take 1 tablet (20 mEq total) by mouth daily. (Patient not taking: Reported on 04/15/2024) 30 tablet 1   sertraline  (ZOLOFT ) 25 MG tablet Take 1 tablet (25 mg total) by mouth daily. (Patient not taking: Reported on 04/15/2024) 30 tablet 3     Discharge Medications: Please see after visit summary for a list of discharge medications.  Relevant Imaging Results:  Relevant Lab Results:   Additional Information SSN: [id number] [place]

## 2024-04-15 NOTE — ED Notes (Signed)
 CSW spoke with patient at bedside to discuss PT recommendation for short - term rehab. Patient agreeable and referrals were sent out to 4-5 star facilities. Patient did give CSW permission to speak with her cousin Ozell. CSW called Ozell and made him aware of patient disposition plans. CSW will continue to follow.

## 2024-04-16 MED ORDER — ALBUTEROL SULFATE HFA 108 (90 BASE) MCG/ACT IN AERS
1.0000 | INHALATION_SPRAY | Freq: Once | RESPIRATORY_TRACT | Status: AC
  Administered 2024-04-16: 2 via RESPIRATORY_TRACT
  Filled 2024-04-16: qty 6.7

## 2024-04-16 NOTE — ED Notes (Signed)
 Pt has had a full bath. Pt was sitting in soiled clothes, found when placing pt on bedpan. Pt stated she wanted to wash up but no one would help her. Patients personal gown is in pt belonging bag.

## 2024-04-16 NOTE — ED Provider Notes (Signed)
 Emergency Medicine Observation Re-evaluation Note  Angela Chase is a 78 y.o. female, seen on rounds today.  Pt initially presented to the ED for complaints of Knee Pain Currently, the patient is awaiting nursing home placement.  Physical Exam  BP (!) 122/54 (BP Location: Right Wrist)   Pulse 83   Temp 98.2 F (36.8 C) (Oral)   Resp 16   Ht 5' 5.5 (1.664 m)   Wt 129.7 kg   SpO2 97%   BMI 46.87 kg/m  Physical Exam Resting and in no acute distress ED Course / MDM  EKG:   I have reviewed the labs performed to date as well as medications administered while in observation.  Recent changes in the last 24 hours include none.  Plan  Current plan is for nursing home placement.    Suzette Pac, MD 04/16/24 [phone]

## 2024-04-16 NOTE — ED Notes (Signed)
 CSW spoke with patient at bedside about [facility] being able to offer her a bed for short-term rehab. Patient hesitate about going that far. CSW reached out to see if [facility] re[facility] can offer , but will not have a bed until Monday or Tuesday. CSW also reached out to [facility] to see if they could offer a bed. CSW will continue to follow.

## 2024-04-17 MED ORDER — ALBUTEROL SULFATE HFA 108 (90 BASE) MCG/ACT IN AERS: 1.0000 | INHALATION_SPRAY | Freq: Four times a day (QID) | RESPIRATORY_TRACT | 0 refills | Status: AC | PRN

## 2024-04-17 MED ORDER — ALBUTEROL SULFATE HFA 108 (90 BASE) MCG/ACT IN AERS
1.0000 | INHALATION_SPRAY | RESPIRATORY_TRACT | Status: DC | PRN
  Administered 2024-04-17 – 2024-04-18 (×2): 2 via RESPIRATORY_TRACT
  Filled 2024-04-17: qty 6.7

## 2024-04-17 MED ORDER — AEROCHAMBER PLUS FLO-VU MEDIUM MISC
1.0000 | Freq: Once | Status: AC
  Administered 2024-04-17: 1

## 2024-04-17 NOTE — ED Notes (Signed)
 Patient Angela Chase was approved. CSW called [facility] to get selected facility added- facility was added. Donald with [facility] updated on Auth. Room and report number was provided to nurse. Asked MD for AVS and made patient aware. CSW signing off.

## 2024-04-17 NOTE — ED Notes (Signed)
 Pt resting comfortably in bed, all needs met at current time. Talking on phone with friend.

## 2024-04-17 NOTE — Discharge Instructions (Addendum)
 Seen in the emergency department for bilateral knee pain and right shoulder pain.  You had x-rays of those areas that did not show any fracture or dislocation.  You were evaluated by social work and physical therapy and they have been able to find you a place for rehab.  Please continue your regular medications and follow-up with your primary care doctor.  Return to the emergency department if any worsening or concerning symptoms.
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
## 2024-04-18 NOTE — ED Notes (Signed)
 Pt is giving herself a bed bath at this time.

## 2024-05-03 NOTE — ED Notes (Signed)
 1/5 9:20 am- CSW received call from Katie with [facility] who inquired where pt discharged to. CSW provided info that pt discharged to [facility]. [facility] states they will continue to follow pt at this facility.
